# Patient Record
Sex: Male | Born: 1980 | Race: White | Hispanic: No | Marital: Married | Smoking: Never smoker
Health system: Southern US, Community
[De-identification: ages and names within clinical notes are randomized; demographics above are authoritative.]

## PROBLEM LIST (undated history)

## (undated) DIAGNOSIS — G4730 Sleep apnea, unspecified: Secondary | ICD-10-CM

## (undated) DIAGNOSIS — F319 Bipolar disorder, unspecified: Secondary | ICD-10-CM

## (undated) DIAGNOSIS — E669 Obesity, unspecified: Secondary | ICD-10-CM

## (undated) DIAGNOSIS — F919 Conduct disorder, unspecified: Secondary | ICD-10-CM

## (undated) DIAGNOSIS — M999 Biomechanical lesion, unspecified: Secondary | ICD-10-CM

## (undated) DIAGNOSIS — K219 Gastro-esophageal reflux disease without esophagitis: Secondary | ICD-10-CM

## (undated) DIAGNOSIS — S065X9A Traumatic subdural hemorrhage with loss of consciousness of unspecified duration, initial encounter: Secondary | ICD-10-CM

## (undated) DIAGNOSIS — I10 Essential (primary) hypertension: Secondary | ICD-10-CM

## (undated) DIAGNOSIS — G2589 Other specified extrapyramidal and movement disorders: Secondary | ICD-10-CM

## (undated) DIAGNOSIS — E1169 Type 2 diabetes mellitus with other specified complication: Principal | ICD-10-CM

## (undated) DIAGNOSIS — R0789 Other chest pain: Secondary | ICD-10-CM

## (undated) DIAGNOSIS — M25561 Pain in right knee: Secondary | ICD-10-CM

## (undated) DIAGNOSIS — S43004A Unspecified dislocation of right shoulder joint, initial encounter: Secondary | ICD-10-CM

## (undated) DIAGNOSIS — N200 Calculus of kidney: Secondary | ICD-10-CM

## (undated) DIAGNOSIS — M75101 Unspecified rotator cuff tear or rupture of right shoulder, not specified as traumatic: Secondary | ICD-10-CM

## (undated) DIAGNOSIS — S2241XA Multiple fractures of ribs, right side, initial encounter for closed fracture: Secondary | ICD-10-CM

## (undated) DIAGNOSIS — R350 Frequency of micturition: Secondary | ICD-10-CM

## (undated) DIAGNOSIS — D169 Benign neoplasm of bone and articular cartilage, unspecified: Secondary | ICD-10-CM

## (undated) DIAGNOSIS — S32401A Unspecified fracture of right acetabulum, initial encounter for closed fracture: Secondary | ICD-10-CM

## (undated) DIAGNOSIS — R000 Tachycardia, unspecified: Secondary | ICD-10-CM

## (undated) DIAGNOSIS — M549 Dorsalgia, unspecified: Secondary | ICD-10-CM

## (undated) DIAGNOSIS — E119 Type 2 diabetes mellitus without complications: Secondary | ICD-10-CM

## (undated) DIAGNOSIS — Z0000 Encounter for general adult medical examination without abnormal findings: Secondary | ICD-10-CM

## (undated) DIAGNOSIS — M25511 Pain in right shoulder: Secondary | ICD-10-CM

## (undated) DIAGNOSIS — E785 Hyperlipidemia, unspecified: Secondary | ICD-10-CM

## (undated) DIAGNOSIS — K439 Ventral hernia without obstruction or gangrene: Secondary | ICD-10-CM

## (undated) DIAGNOSIS — M7541 Impingement syndrome of right shoulder: Secondary | ICD-10-CM

## (undated) DIAGNOSIS — M216X9 Other acquired deformities of unspecified foot: Secondary | ICD-10-CM

## (undated) DIAGNOSIS — S4351XA Sprain of right acromioclavicular joint, initial encounter: Secondary | ICD-10-CM

## (undated) DIAGNOSIS — Z6841 Body Mass Index (BMI) 40.0 and over, adult: Secondary | ICD-10-CM

## (undated) HISTORY — DX: Essential (primary) hypertension: I10

## (undated) HISTORY — DX: Gastro-esophageal reflux disease without esophagitis: K21.9

## (undated) HISTORY — DX: Obesity, unspecified: E66.9

## (undated) HISTORY — DX: Other acquired deformities of unspecified foot: M21.6X9

## (undated) HISTORY — DX: Multiple fractures of ribs, right side, initial encounter for closed fracture: S22.41XA

## (undated) HISTORY — DX: Benign neoplasm of bone and articular cartilage, unspecified: D16.9

## (undated) HISTORY — DX: Calculus of kidney: N20.0

## (undated) HISTORY — DX: Bipolar disorder, unspecified: F31.9

## (undated) HISTORY — DX: Encounter for general adult medical examination without abnormal findings: Z00.00

## (undated) HISTORY — DX: Other chest pain: R07.89

## (undated) HISTORY — DX: Unspecified rotator cuff tear or rupture of right shoulder, not specified as traumatic: M75.101

## (undated) HISTORY — DX: Sleep apnea, unspecified: G47.30

## (undated) HISTORY — DX: Conduct disorder, unspecified: F91.9

## (undated) HISTORY — DX: Sprain of right acromioclavicular joint, initial encounter: S43.51XA

## (undated) HISTORY — DX: Pain in right shoulder: M25.511

## (undated) HISTORY — DX: Unspecified fracture of right acetabulum, initial encounter for closed fracture: S32.401A

## (undated) HISTORY — DX: Impingement syndrome of right shoulder: M75.41

## (undated) HISTORY — DX: Unspecified dislocation of right shoulder joint, initial encounter: S43.004A

## (undated) HISTORY — DX: Morbid (severe) obesity due to excess calories: E66.01

## (undated) HISTORY — DX: Tachycardia, unspecified: R00.0

## (undated) HISTORY — PX: CORONARY ARTERY BYPASS GRAFT: SHX141

## (undated) HISTORY — DX: Other specified extrapyramidal and movement disorders: G25.89

## (undated) HISTORY — DX: Biomechanical lesion, unspecified: M99.9

## (undated) HISTORY — DX: Ventral hernia without obstruction or gangrene: K43.9

## (undated) HISTORY — DX: Dorsalgia, unspecified: M54.9

## (undated) HISTORY — DX: Type 2 diabetes mellitus with other specified complication: E11.69

## (undated) HISTORY — DX: Type 2 diabetes mellitus without complications: E11.9

## (undated) HISTORY — DX: Hyperlipidemia, unspecified: E78.5

## (undated) HISTORY — DX: Pain in right knee: M25.561

## (undated) HISTORY — DX: Traumatic subdural hemorrhage with loss of consciousness of unspecified duration, initial encounter: S06.5X9A

## (undated) HISTORY — DX: Frequency of micturition: R35.0

## (undated) HISTORY — DX: Body Mass Index (BMI) 40.0 and over, adult: Z684

---

## 2000-10-13 ENCOUNTER — Emergency Department (HOSPITAL_COMMUNITY): Admission: EM | Admit: 2000-10-13 | Discharge: 2000-10-13 | Payer: Self-pay | Admitting: Emergency Medicine

## 2000-10-13 ENCOUNTER — Encounter: Payer: Self-pay | Admitting: Emergency Medicine

## 2001-08-16 ENCOUNTER — Emergency Department (HOSPITAL_COMMUNITY): Admission: EM | Admit: 2001-08-16 | Discharge: 2001-08-16 | Payer: Self-pay | Admitting: Emergency Medicine

## 2006-02-03 ENCOUNTER — Encounter: Admission: RE | Admit: 2006-02-03 | Discharge: 2006-02-03 | Payer: Self-pay | Admitting: Urology

## 2008-12-18 ENCOUNTER — Emergency Department (HOSPITAL_COMMUNITY): Admission: EM | Admit: 2008-12-18 | Discharge: 2008-12-18 | Payer: Self-pay | Admitting: Family Medicine

## 2010-01-18 ENCOUNTER — Emergency Department (HOSPITAL_COMMUNITY)
Admission: EM | Admit: 2010-01-18 | Discharge: 2010-01-18 | Payer: Self-pay | Source: Home / Self Care | Admitting: Emergency Medicine

## 2010-01-28 ENCOUNTER — Encounter
Admission: RE | Admit: 2010-01-28 | Discharge: 2010-02-17 | Payer: Self-pay | Source: Home / Self Care | Attending: Orthopedic Surgery | Admitting: Orthopedic Surgery

## 2010-04-23 LAB — POCT RAPID STREP A (OFFICE): Streptococcus, Group A Screen (Direct): NEGATIVE

## 2011-01-23 ENCOUNTER — Emergency Department (HOSPITAL_COMMUNITY): Payer: BC Managed Care – PPO

## 2011-01-23 ENCOUNTER — Encounter: Payer: Self-pay | Admitting: Adult Health

## 2011-01-23 ENCOUNTER — Emergency Department (HOSPITAL_COMMUNITY)
Admission: EM | Admit: 2011-01-23 | Discharge: 2011-01-23 | Disposition: A | Discharge status: Home / Self Care | Payer: BC Managed Care – PPO | Attending: Emergency Medicine | Admitting: Emergency Medicine

## 2011-01-23 DIAGNOSIS — R109 Unspecified abdominal pain: Secondary | ICD-10-CM | POA: Insufficient documentation

## 2011-01-23 DIAGNOSIS — N201 Calculus of ureter: Secondary | ICD-10-CM | POA: Insufficient documentation

## 2011-01-23 DIAGNOSIS — N200 Calculus of kidney: Secondary | ICD-10-CM

## 2011-01-23 DIAGNOSIS — K7689 Other specified diseases of liver: Secondary | ICD-10-CM | POA: Insufficient documentation

## 2011-01-23 LAB — BASIC METABOLIC PANEL
BUN: 10 mg/dL (ref 6–23)
CO2: 25 mEq/L (ref 19–32)
Calcium: 9.8 mg/dL (ref 8.4–10.5)
Chloride: 100 mEq/L (ref 96–112)
Creatinine, Ser: 0.77 mg/dL (ref 0.50–1.35)
Glucose, Bld: 172 mg/dL — ABNORMAL HIGH (ref 70–99)

## 2011-01-23 LAB — URINALYSIS, ROUTINE W REFLEX MICROSCOPIC
Glucose, UA: NEGATIVE mg/dL
Leukocytes, UA: NEGATIVE
Protein, ur: NEGATIVE mg/dL
Specific Gravity, Urine: 1.019 (ref 1.005–1.030)
Urobilinogen, UA: 0.2 mg/dL (ref 0.0–1.0)

## 2011-01-23 LAB — DIFFERENTIAL
Basophils Absolute: 0 10*3/uL (ref 0.0–0.1)
Eosinophils Absolute: 0.1 10*3/uL (ref 0.0–0.7)
Eosinophils Relative: 1 % (ref 0–5)
Lymphocytes Relative: 12 % (ref 12–46)
Monocytes Absolute: 0.5 10*3/uL (ref 0.1–1.0)

## 2011-01-23 LAB — CBC
HCT: 44.2 % (ref 39.0–52.0)
MCH: 29.7 pg (ref 26.0–34.0)
MCV: 85.8 fL (ref 78.0–100.0)
Platelets: 252 10*3/uL (ref 150–400)
RDW: 12.9 % (ref 11.5–15.5)
WBC: 11.4 10*3/uL — ABNORMAL HIGH (ref 4.0–10.5)

## 2011-01-23 LAB — URINE MICROSCOPIC-ADD ON

## 2011-01-23 MED ORDER — ONDANSETRON HCL 8 MG PO TABS: 8.0000 mg | ORAL_TABLET | Freq: Three times a day (TID) | ORAL | Status: AC | PRN

## 2011-01-23 MED ORDER — ONDANSETRON HCL 4 MG/2ML IJ SOLN
INTRAMUSCULAR | Status: AC
  Administered 2011-01-23: 21:00:00
  Filled 2011-01-23: qty 2

## 2011-01-23 MED ORDER — OXYCODONE-ACETAMINOPHEN 5-325 MG PO TABS: 2.0000 | ORAL_TABLET | ORAL | Status: AC | PRN

## 2011-01-23 MED ORDER — CEPHALEXIN 500 MG PO CAPS: 500.0000 mg | ORAL_CAPSULE | Freq: Four times a day (QID) | ORAL | Status: AC

## 2011-01-23 MED ORDER — IBUPROFEN 800 MG PO TABS: 800.0000 mg | ORAL_TABLET | Freq: Three times a day (TID) | ORAL | Status: AC | PRN

## 2011-01-23 MED ORDER — HYDROMORPHONE HCL PF 2 MG/ML IJ SOLN
2.0000 mg | Freq: Once | INTRAMUSCULAR | Status: AC
  Administered 2011-01-23: 2 mg via INTRAVENOUS
  Filled 2011-01-23: qty 1

## 2011-01-23 MED ORDER — KETOROLAC TROMETHAMINE 30 MG/ML IJ SOLN
30.0000 mg | Freq: Once | INTRAMUSCULAR | Status: AC
  Administered 2011-01-23: 30 mg via INTRAVENOUS
  Filled 2011-01-23: qty 1

## 2011-01-23 MED ORDER — TAMSULOSIN HCL 0.4 MG PO CAPS: 0.4000 mg | ORAL_CAPSULE | Freq: Every day | ORAL | Status: DC

## 2011-01-23 NOTE — ED Notes (Signed)
ZOX:[id number]<AV> Expected date:01/23/11<BR> Expected time: 5:55 PM<BR> Means of arrival:Ambulance<BR> Comments:<BR>

## 2011-01-23 NOTE — ED Notes (Signed)
Pt given 250 mcg of fentanyl by EMS with no relief.

## 2011-01-23 NOTE — ED Provider Notes (Signed)
History     CSN: [id number]  Arrival date & time 01/23/11  1751   First MD Initiated Contact with Patient 01/23/11 1856      Chief Complaint  Patient presents with  . Flank Pain  . Groin Pain    (Consider location/radiation/quality/duration/timing/severity/associated sxs/prior treatment) HPI Comments: The patient reports acute onset severe left flank pain around 2:30 PM earlier today, radiating around to the left lower abdomen and genitals, sharp and stabbing, and accompanied by nausea but no vomiting. At present he reports that he feels the need to urinate urgently but is unable to pass urine despite many attempts.  Patient is a 31 y.o. male presenting with flank pain. The history is provided by the patient.  Flank Pain This is a new problem. The current episode started 3 to 5 hours ago. The problem occurs constantly. The problem has been rapidly improving. Associated symptoms include abdominal pain. Pertinent negatives include no chest pain, no headaches and no shortness of breath. The symptoms are aggravated by nothing. The symptoms are relieved by medications, narcotics and NSAIDs.    History reviewed. No pertinent past medical history.  History reviewed. No pertinent past surgical history.  History reviewed. No pertinent family history.  History  Substance Use Topics  . Smoking status: Not on file  . Smokeless tobacco: Not on file  . Alcohol Use: Not on file      Review of Systems  Constitutional: Negative for fever, chills, activity change, appetite change and fatigue.  HENT: Negative.   Eyes: Negative.   Respiratory: Negative for cough, shortness of breath and wheezing.   Cardiovascular: Negative for chest pain.  Gastrointestinal: Positive for nausea, vomiting and abdominal pain. Negative for diarrhea, constipation, blood in stool, abdominal distention, anal bleeding and rectal pain.  Genitourinary: Positive for dysuria, urgency and flank pain. Negative for  frequency, hematuria, decreased urine volume, discharge, genital sores, penile pain and testicular pain.  Musculoskeletal: Negative.   Skin: Negative for color change and rash.  Neurological: Negative for light-headedness and headaches.  Hematological: Does not bruise/bleed easily.  Psychiatric/Behavioral: Negative.     Allergies  Review of patient's allergies indicates no known allergies.  Home Medications   Current Outpatient Rx  Name Route Sig Dispense Refill  . DOCUSATE SODIUM 100 MG PO CAPS Oral Take 100 mg by mouth 2 (two) times daily.        BP 118/64  Pulse 118  Temp(Src) 98.3 F (36.8 C) (Oral)  Resp 24  SpO2 92%  Physical Exam  Nursing note and vitals reviewed. Constitutional: He is oriented to person, place, and time. He appears well-nourished. No distress.  HENT:  Head: Normocephalic and atraumatic.  Eyes: EOM are normal. Pupils are equal, round, and reactive to light.  Neck: Normal range of motion. Neck supple. No JVD present.  Cardiovascular: Normal rate, regular rhythm, normal heart sounds and intact distal pulses.  Exam reveals no gallop and no friction rub.   No murmur heard. Pulmonary/Chest: Effort normal and breath sounds normal. No respiratory distress. He has no wheezes. He has no rales. He exhibits no tenderness.  Abdominal: Soft. Bowel sounds are normal. He exhibits no distension, no fluid wave, no ascites and no mass. There is tenderness. There is CVA tenderness. There is no rebound and no guarding.  Musculoskeletal: Normal range of motion. He exhibits no edema and no tenderness.  Neurological: He is alert and oriented to person, place, and time. He has normal reflexes. No cranial nerve deficit. He exhibits  normal muscle tone. Coordination normal.  Skin: Skin is warm and dry. No rash noted. He is not diaphoretic. No erythema. No pallor.  Psychiatric: He has a normal mood and affect. His behavior is normal. Judgment and thought content normal.    ED  Course  Procedures (including critical care time)  Labs Reviewed  CBC - Abnormal; Notable for the following:    WBC 11.4 (*)    All other components within normal limits  DIFFERENTIAL - Abnormal; Notable for the following:    Neutrophils Relative 82 (*)    Neutro Abs 9.4 (*)    All other components within normal limits  BASIC METABOLIC PANEL  URINE CULTURE  URINALYSIS, ROUTINE W REFLEX MICROSCOPIC   No results found.   No diagnosis found.    MDM  Symptoms are strongly suggestive of kidney stone, although urinary tract infection and renal colic are also considerations in the differential diagnosis amongst other etiologies. The patient's pain has been controlled by IV analgesics, and I will have a nurse do an in and out cath of the bladder to relieve any potential bladder outlet obstruction and relieve the patient's urinary urgency. I will obtain a CT scan to evaluate for kidney stone.        Felisa Bonier, MD 01/23/11 2001

## 2011-01-23 NOTE — ED Notes (Signed)
Family at bedside. 

## 2011-01-23 NOTE — ED Notes (Signed)
Patient transported to CT 

## 2011-01-23 NOTE — ED Notes (Signed)
C/o 10/10 sudden onset of left flank pain that radiates to abdomen and groin. Pain has increased in intensity. Unable to urinate.

## 2011-01-25 LAB — URINE CULTURE
Colony Count: NO GROWTH
Culture: NO GROWTH

## 2011-01-30 ENCOUNTER — Other Ambulatory Visit: Payer: Self-pay | Admitting: Orthopedic Surgery

## 2011-01-30 DIAGNOSIS — M25561 Pain in right knee: Secondary | ICD-10-CM

## 2011-02-05 ENCOUNTER — Ambulatory Visit
Admission: RE | Admit: 2011-02-05 | Discharge: 2011-02-05 | Disposition: A | Discharge status: Home / Self Care | Payer: Worker's Compensation | Source: Ambulatory Visit | Attending: Orthopedic Surgery | Admitting: Orthopedic Surgery

## 2011-02-05 DIAGNOSIS — M25561 Pain in right knee: Secondary | ICD-10-CM

## 2011-07-23 ENCOUNTER — Encounter (HOSPITAL_COMMUNITY): Payer: Self-pay | Admitting: Emergency Medicine

## 2011-07-23 ENCOUNTER — Emergency Department (HOSPITAL_COMMUNITY)
Admission: EM | Admit: 2011-07-23 | Discharge: 2011-07-23 | Disposition: A | Discharge status: Home / Self Care | Payer: BC Managed Care – PPO | Attending: Emergency Medicine | Admitting: Emergency Medicine

## 2011-07-23 DIAGNOSIS — R109 Unspecified abdominal pain: Secondary | ICD-10-CM | POA: Insufficient documentation

## 2011-07-23 DIAGNOSIS — N209 Urinary calculus, unspecified: Secondary | ICD-10-CM

## 2011-07-23 DIAGNOSIS — M549 Dorsalgia, unspecified: Secondary | ICD-10-CM | POA: Insufficient documentation

## 2011-07-23 DIAGNOSIS — N200 Calculus of kidney: Secondary | ICD-10-CM | POA: Insufficient documentation

## 2011-07-23 DIAGNOSIS — E119 Type 2 diabetes mellitus without complications: Secondary | ICD-10-CM | POA: Insufficient documentation

## 2011-07-23 HISTORY — DX: Bipolar disorder, unspecified: F31.9

## 2011-07-23 HISTORY — DX: Calculus of kidney: N20.0

## 2011-07-23 LAB — CBC WITH DIFFERENTIAL/PLATELET
Basophils Relative: 0 % (ref 0–1)
Eosinophils Absolute: 0.2 10*3/uL (ref 0.0–0.7)
Eosinophils Relative: 2 % (ref 0–5)
HCT: 46 % (ref 39.0–52.0)
Hemoglobin: 16 g/dL (ref 13.0–17.0)
MCH: 30.3 pg (ref 26.0–34.0)
MCHC: 34.8 g/dL (ref 30.0–36.0)
Monocytes Absolute: 0.5 10*3/uL (ref 0.1–1.0)
Monocytes Relative: 6 % (ref 3–12)
RDW: 12.9 % (ref 11.5–15.5)

## 2011-07-23 LAB — URINALYSIS, ROUTINE W REFLEX MICROSCOPIC
Glucose, UA: NEGATIVE mg/dL
Leukocytes, UA: NEGATIVE
Specific Gravity, Urine: 1.029 (ref 1.005–1.030)
Urobilinogen, UA: 0.2 mg/dL (ref 0.0–1.0)

## 2011-07-23 LAB — BASIC METABOLIC PANEL
BUN: 16 mg/dL (ref 6–23)
Calcium: 9.5 mg/dL (ref 8.4–10.5)
Chloride: 102 mEq/L (ref 96–112)
Creatinine, Ser: 0.84 mg/dL (ref 0.50–1.35)
GFR calc Af Amer: >90 mL/min (ref >90)
GFR calc non Af Amer: >90 mL/min (ref >90)

## 2011-07-23 LAB — URINE MICROSCOPIC-ADD ON

## 2011-07-23 MED ORDER — ONDANSETRON HCL 4 MG/2ML IJ SOLN
4.0000 mg | Freq: Once | INTRAMUSCULAR | Status: AC
  Administered 2011-07-23: 4 mg via INTRAVENOUS
  Filled 2011-07-23: qty 2

## 2011-07-23 MED ORDER — HYDROMORPHONE HCL PF 1 MG/ML IJ SOLN
1.0000 mg | Freq: Once | INTRAMUSCULAR | Status: AC
  Administered 2011-07-23: 1 mg via INTRAVENOUS
  Filled 2011-07-23: qty 1

## 2011-07-23 MED ORDER — OXYCODONE-ACETAMINOPHEN 5-325 MG PO TABS: 1.0000 | ORAL_TABLET | ORAL | Status: AC | PRN

## 2011-07-23 MED ORDER — FENTANYL CITRATE 0.05 MG/ML IJ SOLN
50.0000 ug | Freq: Once | INTRAMUSCULAR | Status: AC
  Administered 2011-07-23: 50 ug via INTRAVENOUS
  Filled 2011-07-23: qty 2

## 2011-07-23 NOTE — ED Notes (Signed)
Pt's 02 drops to low to mid 80s when he falls asleep, when he wakes up pt's 02 goes back to 100%

## 2011-07-23 NOTE — ED Provider Notes (Signed)
History     CSN: [id number]  Arrival date & time 07/23/11  0420   First MD Initiated Contact with Patient 07/23/11 0501      Chief Complaint  Patient presents with  . Abdominal Pain  . Back Pain    (Consider location/radiation/quality/duration/timing/severity/associated sxs/prior treatment) HPI Comments: Colin Bennett is a 31 y.o. Male who presents with right flank pain, which started at 1:30 AM today. It is similar to prior pains he has had with kidney stone. He has passed several kidney stones this year. He denies fever, or chills. He has had nausea and vomiting this morning. He did not take any medication yet. He does not know any aggravating or alleviating factors. He was recently diagnosed with diabetes, but is treated only with diet control currently.  Patient is a 31 y.o. male presenting with abdominal pain and back pain. The history is provided by the patient.  Abdominal Pain The primary symptoms of the illness include abdominal pain.  Additional symptoms associated with the illness include back pain.  Back Pain  Associated symptoms include abdominal pain.    Past Medical History  Diagnosis Date  . Kidney stones   . Diabetes mellitus   . Bipolar 1 disorder     History reviewed. No pertinent past surgical history.  Family History  Problem Relation Age of Onset  . Hypertension Other   . Diabetes Other     History  Substance Use Topics  . Smoking status: Never Smoker   . Smokeless tobacco: Not on file  . Alcohol Use: No      Review of Systems  Gastrointestinal: Positive for abdominal pain.  Musculoskeletal: Positive for back pain.  All other systems reviewed and are negative.    Allergies  Review of patient's allergies indicates no known allergies.  Home Medications   No current outpatient prescriptions on file.  BP 148/109  Pulse 76  Temp 97.8 F (36.6 C) (Oral)  Resp 21  SpO2 89%  Physical Exam  Nursing note and vitals  reviewed. Constitutional: He is oriented to person, place, and time. He appears well-developed and well-nourished.  HENT:  Head: Normocephalic and atraumatic.  Right Ear: External ear normal.  Left Ear: External ear normal.  Eyes: Conjunctivae and EOM are normal. Pupils are equal, round, and reactive to light.  Neck: Normal range of motion and phonation normal. Neck supple.  Cardiovascular: Normal rate, regular rhythm, normal heart sounds and intact distal pulses.   Pulmonary/Chest: Effort normal and breath sounds normal. He exhibits no bony tenderness.  Abdominal: Soft. Normal appearance. There is tenderness.  Genitourinary:       No costovertebral angle tenderness  Musculoskeletal: Normal range of motion.  Neurological: He is alert and oriented to person, place, and time. He has normal strength. No cranial nerve deficit or sensory deficit. He exhibits normal muscle tone. Coordination normal.  Skin: Skin is warm, dry and intact.  Psychiatric: He has a normal mood and affect. His behavior is normal. Judgment and thought content normal.    ED Course  Procedures (including critical care time)  Emergency department treatment: IV, Dilaudid, and Zofran.  Reevaluation: 06:50- pain is resolved   Labs Reviewed  BASIC METABOLIC PANEL - Abnormal; Notable for the following:    Glucose, Bld 198 (*)     All other components within normal limits  URINALYSIS, ROUTINE W REFLEX MICROSCOPIC - Abnormal; Notable for the following:    Color, Urine AMBER (*)  BIOCHEMICALS MAY BE AFFECTED BY COLOR  APPearance CLOUDY (*)     Hgb urine dipstick LARGE (*)     Ketones, ur TRACE (*)     Protein, ur 30 (*)     All other components within normal limits  URINE MICROSCOPIC-ADD ON - Abnormal; Notable for the following:    Bacteria, UA MANY (*)     All other components within normal limits  CBC WITH DIFFERENTIAL   No results found.   1. Urolithiasis       MDM  Patient with known bilateral renal  stones. Evaluated, and treated without CT imaging. He is improved. I suspect ureteral colic, right. He is a reasonable candidate for discharge with symptomatic treatment. Doubt metabolic instability, serious bacterial infection or impending vascular collapse; the patient is stable for discharge.   Plan: Home Medications- percocet, instructed to use Flomax, that he has at home; Home Treatments- strain urine; Recommended follow up- Urology f/u in 1 weeek       Flint Melter, MD 07/23/11 [phone]

## 2011-07-23 NOTE — ED Notes (Signed)
Pt states he is having lower abd pain like in his pelvic area and low back pain bilaterally   Pt states he has hx of kidney stones   Pt newly diagnosed with diabetes

## 2011-07-23 NOTE — ED Notes (Signed)
Pt notified that we need urine sample.  St's he doesn't need to go right now.  Pt given a urinal.  Will check back shortly.

## 2011-08-03 ENCOUNTER — Ambulatory Visit (INDEPENDENT_AMBULATORY_CARE_PROVIDER_SITE_OTHER): Payer: BC Managed Care – PPO | Admitting: Internal Medicine

## 2011-08-03 ENCOUNTER — Encounter: Payer: Self-pay | Admitting: Internal Medicine

## 2011-08-03 VITALS — BP 116/82 | HR 118 | Temp 98.3°F | Resp 18 | Ht 71.5 in | Wt 383.0 lb

## 2011-08-03 DIAGNOSIS — E119 Type 2 diabetes mellitus without complications: Secondary | ICD-10-CM | POA: Insufficient documentation

## 2011-08-03 HISTORY — DX: Type 2 diabetes mellitus without complications: E11.9

## 2011-08-03 LAB — HEMOGLOBIN A1C: Hgb A1c MFr Bld: 8.2 % — ABNORMAL HIGH (ref <5.7)

## 2011-08-03 NOTE — Progress Notes (Signed)
  Subjective:    Patient ID: Colin Bennett, male    DOB: 1980-08-04, 31 y.o.   MRN: [id number]  HPI Pt presents to clinic for evaluation of diabetes mellitus. Notes recent new dx within last few weeks. Presented for physical and had hyperglycemia of 171 on labs. Repeated glucose fasting the next day 182. +polyuria. Was begun on unknown dose of sulfonylurea. Monitoring fsbs tid before meals and notes range of 120-170 without hypoglycemia. Known h/o kidney stones with recent ED visit for same. chem7 reviewed from that visit shows glucose of 198.   Past Medical History  Diagnosis Date  . Kidney stones   . Diabetes mellitus   . Bipolar 1 disorder    No past surgical history on file.  reports that he has never smoked. He does not have any smokeless tobacco history on file. He reports that he does not drink alcohol or use illicit drugs. family history includes Diabetes in his other and Hypertension in his other. No Known Allergies   Review of Systems  Genitourinary: Negative for urgency and difficulty urinating.       +polyuria  Musculoskeletal: Negative for back pain.  Neurological: Negative for numbness.  All other systems reviewed and are negative.       Objective:   Physical Exam  Nursing note and vitals reviewed. Constitutional: He appears well-developed and well-nourished. No distress.  HENT:  Head: Normocephalic and atraumatic.  Right Ear: External ear normal.  Left Ear: External ear normal.  Nose: Nose normal.  Eyes: Conjunctivae are normal. No scleral icterus.  Neck: Neck supple.  Cardiovascular: Normal rate, regular rhythm and normal heart sounds.  Exam reveals no gallop and no friction rub.   No murmur heard. Pulmonary/Chest: Effort normal and breath sounds normal. No respiratory distress. He has no wheezes. He has no rales.  Neurological: He is alert.  Skin: Skin is warm and dry. He is not diaphoretic.  Psychiatric: He has a normal mood and affect.            Assessment & Plan:

## 2011-08-03 NOTE — Assessment & Plan Note (Signed)
Obtain A1c. Refer to nutritionist/diabetic educator. Encouraged low sugar/carb diet, regular exercise and wt loss. Call with medication dosage. Aware of potential for hypoglycemia and know s/s. Carry piece of candy or glucose tab on person. Schedule close follow up.

## 2011-08-04 ENCOUNTER — Telehealth: Payer: Self-pay | Admitting: Internal Medicine

## 2011-08-04 NOTE — Telephone Encounter (Signed)
Patient states that he is currently taking   Glimepiride 2mg  once daily.

## 2011-08-04 NOTE — Telephone Encounter (Signed)
Patient's EMR chart updated with Rx information/SLS

## 2011-08-06 ENCOUNTER — Telehealth: Payer: Self-pay | Admitting: *Deleted

## 2011-08-06 MED ORDER — GLIMEPIRIDE 4 MG PO TABS: 4.0000 mg | ORAL_TABLET | Freq: Every day | ORAL | Status: DC

## 2011-08-06 NOTE — Telephone Encounter (Signed)
Patient informed, will keep log of CBG readings; new Rx to pharmacy per patient request/SLS

## 2011-08-06 NOTE — Telephone Encounter (Signed)
Message copied by Regis Bill on Thu Aug 06, 2011 12:07 PM ------      Message from: Edwyna Perfect      Created: Wed Aug 05, 2011  3:09 PM       a1c 8.2 which roughly equals a sugar avg of ~190. Would increase amaryl form 2mg  qd to 4mg  qd aiming for am fasting sugars to be <130 but without lows (60's or lower)

## 2011-08-24 ENCOUNTER — Ambulatory Visit: Payer: BC Managed Care – PPO | Admitting: Internal Medicine

## 2011-08-24 ENCOUNTER — Ambulatory Visit: Payer: BC Managed Care – PPO | Admitting: *Deleted

## 2011-08-31 ENCOUNTER — Encounter: Payer: BC Managed Care – PPO | Attending: Internal Medicine | Admitting: *Deleted

## 2011-08-31 ENCOUNTER — Encounter: Payer: Self-pay | Admitting: Internal Medicine

## 2011-08-31 ENCOUNTER — Ambulatory Visit (INDEPENDENT_AMBULATORY_CARE_PROVIDER_SITE_OTHER): Payer: BC Managed Care – PPO | Admitting: Internal Medicine

## 2011-08-31 ENCOUNTER — Telehealth: Payer: Self-pay | Admitting: Internal Medicine

## 2011-08-31 ENCOUNTER — Encounter: Payer: Self-pay | Admitting: *Deleted

## 2011-08-31 VITALS — Ht 74.5 in | Wt 380.0 lb

## 2011-08-31 VITALS — BP 126/84 | HR 115 | Temp 98.5°F | Resp 18 | Wt 383.0 lb

## 2011-08-31 DIAGNOSIS — B353 Tinea pedis: Secondary | ICD-10-CM

## 2011-08-31 DIAGNOSIS — E119 Type 2 diabetes mellitus without complications: Secondary | ICD-10-CM

## 2011-08-31 DIAGNOSIS — Z713 Dietary counseling and surveillance: Secondary | ICD-10-CM | POA: Insufficient documentation

## 2011-08-31 MED ORDER — FLUCONAZOLE 100 MG PO TABS: 100.0000 mg | ORAL_TABLET | Freq: Every day | ORAL | Status: AC

## 2011-08-31 NOTE — Patient Instructions (Signed)
Please use over the counter lotrimin ultra twice a day for your feet with the pill medication.  Please schedule fasting labs prior to your next visit chem7, a1c, urine microalbumin-250.0 and lipid/lft-272.4

## 2011-08-31 NOTE — Assessment & Plan Note (Signed)
Improving control. Continue amaryl at current dosing but monitor for hypoglycemia (aware of s/s). Prescription provided for test strips and lancets.

## 2011-08-31 NOTE — Progress Notes (Signed)
  Subjective:    Patient ID: Colin Bennett, male    DOB: 1980-08-16, 31 y.o.   MRN: [id number]  HPI Pt presents to clinic for followup of multiple medical problems. Reviewed recent dx of DM s/p dose increase of amaryl due to A1c of 8.2. Saw diabetic education/nutritionist today and found it very helpful. He and his wife plan on losing 100lbs together with diet and exercise. Reviewed fsbs log- predominance of values in low to mid 100's. Occasional sub 100 value without hypoglycemia. Has chronic bilateral foot rash s/p antifungal creams with improvement but no resolution.   Past Medical History  Diagnosis Date  . Kidney stones   . Diabetes mellitus   . Bipolar 1 disorder    No past surgical history on file.  reports that he has never smoked. He has never used smokeless tobacco. He reports that he does not drink alcohol or use illicit drugs. family history includes Diabetes in his other and Hypertension in his other. No Known Allergies    Review of Systems see hpi     Objective:   Physical Exam  Nursing note and vitals reviewed. Constitutional: He appears well-developed and well-nourished. No distress.  HENT:  Head: Normocephalic and atraumatic.  Eyes: Conjunctivae are normal. No scleral icterus.  Neurological: He is alert.  Skin: Skin is warm and dry. Rash noted. He is not diaphoretic. No erythema.       Diabetic foot exam: +2 dp pulses bilaterally. Monofilament normal bilaterally. Diffuse plantar and intertriginous scaling/dryness with occasional fissuring. No drainage, redness or warmth.   Psychiatric: He has a normal mood and affect.          Assessment & Plan:

## 2011-08-31 NOTE — Progress Notes (Signed)
  Medical Nutrition Therapy:  Appt start time: 1030 end time:  1200.  Assessment:  Primary concerns today: patient here with his wife who appears very supportive. He states he was diagnosed 6 weeks ago and is currently on Amaryl @ 40 mg before breakfast. 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 His wife works 8-5 PM 5 days a week and prepares the evening meals now. He states he is not active due to his irregular work hours. They are buying a home and hope to increase their activity level after they move.  MEDICATIONS: Amaryl 40 mg/ day   DIETARY INTAKE:  Usual eating pattern includes 3 meals and 1-2 snacks per day.  Everyday foods include good variety of all food groups. He has cut back significantly on carbs since the diagnosis.  Avoided foods include high carb and fried foods now.    24-hr recall:  B ( AM): at home: sweetened cereal with 2% milk, grilled chicken club on wheat with lett tomato, and mayo and grits, water Snk ( AM): occasionally [employer] Protein Bar  L ( PM): sit down restaurant salad bar OR grilled chicken sandwich with slaw, 1/2 and 1/2 tea Snk ( PM): occasional Protein Bar D ( PM): cooking at home more: lean meat, less starch, much more vegetables, diet soda or water Snk ( PM): none Beverages: water, diet soda, 1/2 and 1/2 tea  Usual physical activity: none, sleeping or working  Estimated energy needs: 2400 calories 270 g carbohydrates 180 g protein 67 g fat  Progress Towards Goal(s):  In progress.   Nutritional Diagnosis:  NB-1.1 Food and nutrition-related knowledge deficit As related to new diagnosis of diabetes.  As evidenced by A1c of 8.2%.    Intervention:  Nutrition counseling and diabetes education initiated. Discussed basic physiology of diabetes, SMBG and rationale of checking BG at alternate times of day, A1c, Carb Counting and reading food labels, and benefits of increased activity. Also discussed action of Amaryl and provided  information on other potential medication options   Handouts given during visit include: Living Well with Diabetes Carb Counting and Food Label handouts Meal Plan Card  Diabetes Medication handout  Monitoring/Evaluation:  Dietary intake, exercise, reading food labels, SMBG and body weight prn. Patient to call for follow up appointment when he can schedule with his work.

## 2011-08-31 NOTE — Assessment & Plan Note (Signed)
Attempt otc lotrimin ultra bid to affected area and diflucan x 14 days. Followup if no improvement or worsening.

## 2011-09-01 ENCOUNTER — Telehealth: Payer: Self-pay | Admitting: Internal Medicine

## 2011-09-01 NOTE — Telephone Encounter (Signed)
Patient states that [facility] will not fill lantus strip prescription. Pharmacy told pt that instructions were written wrong and to contact us. Please assist.   [facility] on [address]

## 2011-09-02 NOTE — Telephone Encounter (Signed)
Order was corrected and faxed back to [facility] Attn: Christina with override for BID testing [BM with Hyperglycemia] 08.13.13/SLS

## 2011-09-11 NOTE — Telephone Encounter (Signed)
Labs entered... 09/11/11@12 :12pm/LMB

## 2011-10-30 LAB — HEMOGLOBIN A1C: Hgb A1c MFr Bld: 6.9 % — ABNORMAL HIGH (ref <5.7)

## 2011-10-30 NOTE — Addendum Note (Signed)
Addended by: Mervin Kung A on: 10/30/2011 02:15 PM   Modules accepted: Orders

## 2011-10-30 NOTE — Telephone Encounter (Signed)
Lipids/lft's, bmp were not drawn on pt today because they were not ordered correctly. Only labs obtained for future appt were:  hgb a1c and urine microalbumin.  Please schedule fasting labs prior to your next visit chem7, a1c, urine microalbumin-250.0 and lipid/lft-272.4

## 2011-10-30 NOTE — Telephone Encounter (Signed)
Pt presented to the lab, order released. 

## 2011-10-30 NOTE — Addendum Note (Signed)
Addended by: Mervin Kung A on: 10/30/2011 01:34 PM   Modules accepted: Orders

## 2011-10-31 LAB — MICROALBUMIN / CREATININE URINE RATIO
Creatinine, Urine: 188 mg/dL
Microalb Creat Ratio: 7.8 mg/g (ref 0.0–30.0)

## 2011-11-02 ENCOUNTER — Ambulatory Visit (INDEPENDENT_AMBULATORY_CARE_PROVIDER_SITE_OTHER): Payer: BC Managed Care – PPO | Admitting: Internal Medicine

## 2011-11-02 ENCOUNTER — Encounter: Payer: Self-pay | Admitting: Internal Medicine

## 2011-11-02 VITALS — BP 114/88 | HR 80 | Temp 98.5°F | Resp 16 | Wt 377.0 lb

## 2011-11-02 DIAGNOSIS — R0989 Other specified symptoms and signs involving the circulatory and respiratory systems: Secondary | ICD-10-CM

## 2011-11-02 DIAGNOSIS — E119 Type 2 diabetes mellitus without complications: Secondary | ICD-10-CM

## 2011-11-02 DIAGNOSIS — R0683 Snoring: Secondary | ICD-10-CM

## 2011-11-02 DIAGNOSIS — R5381 Other malaise: Secondary | ICD-10-CM

## 2011-11-02 DIAGNOSIS — R5383 Other fatigue: Secondary | ICD-10-CM | POA: Insufficient documentation

## 2011-11-02 NOTE — Patient Instructions (Signed)
Please schedule fasting labs prior to next visit A1c, chem7, lipid-250.00

## 2011-11-02 NOTE — Assessment & Plan Note (Signed)
Improving control.  Continue current regimen. 

## 2011-11-02 NOTE — Progress Notes (Signed)
  Subjective:    Patient ID: Colin Bennett, male    DOB: [birth year], 31 y.o.   MRN: [id number]  HPI Pt presents to clinic for followup of multiple medical problems. Recent diagnosis of diabetes. Reports fasting blood sugars a hundred sixteen and a hundred nineteen recently. No hypoglycemia. Postprandial evening blood sugars of a hundred forty-a hundred sixty. Reviewed A1c improved to six point nine. Notes one-month history of increased fatigue. Does snore and has witnessed apnea per his wife. Tinea pedis now resolved with combination of Lotrimin Ultra and Diflucan. No other complaints. He  Past Medical History  Diagnosis Date  . Kidney stones   . Diabetes mellitus   . Bipolar 1 disorder    No past surgical history on file.  reports that he has never smoked. He has never used smokeless tobacco. He reports that he does not drink alcohol or use illicit drugs. family history includes Diabetes in his other and Hypertension in his other. No Known Allergies    Review of Systems see hpi     Objective:   Physical Exam  Nursing note and vitals reviewed. Constitutional: He appears well-developed and well-nourished. No distress.  HENT:  Head: Normocephalic and atraumatic.  Neurological: He is alert.  Skin: He is not diaphoretic.  Psychiatric: He has a normal mood and affect.          Assessment & Plan:

## 2011-11-02 NOTE — Assessment & Plan Note (Signed)
With associated fatigue and witnessed apnea. Schedule pulmonary consult for consideration of possible sleep apnea

## 2011-11-02 NOTE — Assessment & Plan Note (Signed)
Obtain CBC, Chem-7, liver function tests and TSH 

## 2011-11-03 ENCOUNTER — Telehealth: Payer: Self-pay | Admitting: Internal Medicine

## 2011-11-03 LAB — CBC WITH DIFFERENTIAL/PLATELET
Basophils Absolute: 0 10*3/uL (ref 0.0–0.1)
Basophils Relative: 0 % (ref 0–1)
Hemoglobin: 16.6 g/dL (ref 13.0–17.0)
MCHC: 34.4 g/dL (ref 30.0–36.0)
Monocytes Relative: 6 % (ref 3–12)
Neutro Abs: 6.9 10*3/uL (ref 1.7–7.7)
Neutrophils Relative %: 64 % (ref 43–77)
Platelets: 274 10*3/uL (ref 150–400)

## 2011-11-03 LAB — HEPATIC FUNCTION PANEL
AST: 18 U/L (ref 0–37)
Alkaline Phosphatase: 72 U/L (ref 39–117)
Bilirubin, Direct: 0.1 mg/dL (ref 0.0–0.3)
Indirect Bilirubin: 0.5 mg/dL (ref 0.0–0.9)
Total Bilirubin: 0.6 mg/dL (ref 0.3–1.2)

## 2011-11-03 LAB — BASIC METABOLIC PANEL
Potassium: 4.4 mEq/L (ref 3.5–5.3)
Sodium: 141 mEq/L (ref 135–145)

## 2011-11-03 NOTE — Telephone Encounter (Signed)
Lab order week of 01-25-2012 A1c, chem7, lipid-250.00

## 2011-11-10 ENCOUNTER — Institutional Professional Consult (permissible substitution): Payer: BC Managed Care – PPO | Admitting: Pulmonary Disease

## 2011-11-24 ENCOUNTER — Ambulatory Visit (INDEPENDENT_AMBULATORY_CARE_PROVIDER_SITE_OTHER): Payer: BC Managed Care – PPO | Admitting: Pulmonary Disease

## 2011-11-24 ENCOUNTER — Encounter: Payer: Self-pay | Admitting: Pulmonary Disease

## 2011-11-24 VITALS — BP 140/86 | HR 96 | Temp 98.1°F | Ht 75.0 in | Wt 349.0 lb

## 2011-11-24 DIAGNOSIS — G4730 Sleep apnea, unspecified: Secondary | ICD-10-CM

## 2011-11-24 DIAGNOSIS — G4733 Obstructive sleep apnea (adult) (pediatric): Secondary | ICD-10-CM

## 2011-11-24 HISTORY — DX: Sleep apnea, unspecified: G47.30

## 2011-11-24 NOTE — Patient Instructions (Signed)
Schedule sleep study

## 2011-11-24 NOTE — Assessment & Plan Note (Addendum)
Given excessive daytime somnolence, narrow pharyngeal exam, witnessed apneas & loud snoring, obstructive sleep apnea is very likely & an overnight polysomnogram will be scheduled as a split study. The pathophysiology of obstructive sleep apnea , it's cardiovascular consequences & modes of treatment including CPAP were discused with the patient in detail & they evidenced understanding.  His lack of a fixed sleep and poor sleep hygiene is also contributing to his problems. We talked a little bit about this and we'll focus more on this once his OSA is adequately treated. 

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
## 2011-12-23 ENCOUNTER — Ambulatory Visit (HOSPITAL_BASED_OUTPATIENT_CLINIC_OR_DEPARTMENT_OTHER): Payer: BC Managed Care – PPO | Attending: Pulmonary Disease | Admitting: Radiology

## 2011-12-23 VITALS — Ht 75.0 in | Wt 370.0 lb

## 2011-12-23 DIAGNOSIS — G4733 Obstructive sleep apnea (adult) (pediatric): Secondary | ICD-10-CM | POA: Insufficient documentation

## 2011-12-23 DIAGNOSIS — Z6841 Body Mass Index (BMI) 40.0 and over, adult: Secondary | ICD-10-CM | POA: Insufficient documentation

## 2011-12-31 ENCOUNTER — Telehealth: Payer: Self-pay | Admitting: Pulmonary Disease

## 2011-12-31 DIAGNOSIS — G4710 Hypersomnia, unspecified: Secondary | ICD-10-CM

## 2011-12-31 DIAGNOSIS — G4733 Obstructive sleep apnea (adult) (pediatric): Secondary | ICD-10-CM

## 2011-12-31 DIAGNOSIS — G4730 Sleep apnea, unspecified: Secondary | ICD-10-CM

## 2011-12-31 NOTE — Telephone Encounter (Signed)
PSG showed OSA - order sent for CPAP 16 cm, med FF mask

## 2012-01-01 NOTE — Procedures (Signed)
Colin Bennett, Colin NO.:  [id number]  MEDICAL RECORD NO.:  [id number]          PATIENT TYPE:  OUT  LOCATION:  SLEEP CENTER                 FACILITY:  [facility]  PHYSICIAN:  Oretha Milch, MD      DATE OF BIRTH:  Sep 30, 1980  DATE OF STUDY:  12/23/2011                           NOCTURNAL POLYSOMNOGRAM  REFERRING PHYSICIAN:  Oretha Milch, MD  INDICATION FOR STUDY:  Colin Bennett is a morbidly obese gentleman with excessive daytime fatigue, loud and continuous snoring, witnessed apneas, and restless sleep.  At the time of this study, he weighed 370 pounds with a height of 6 and feet 3 inches, BMI of 46, neck size of 19.5 inches.  EPWORTH SLEEPINESS SCORE:  14.  This intervention polysomnogram was performed with sleep technologist in attendance.  EEG, EOG, EMG, EKG, and respiratory parameters were recorded.  Sleep stages, arousals, limb movements, and respiratory data were scored according to criteria laid out by the American Academy of Sleep Medicine.  MEDICATIONS:  SLEEP ARCHITECTURE:  Lights out was at 9:57 p.m., lights on was at 5:04 a.m.  CPAP was initiated at 12:50 a.m.  During the diagnostic portion, total sleep time was 137 minutes with a sleep period time of 142 minutes and the sleep latency of 29 minutes.  Latency to REM sleep was 60 minutes and wake after sleep onset was 5 minutes.  Sleep stages as a percentage of total sleep time was N1 3%, N2 50%, N3 31% and REM sleep 16% (21 minutes).  During the titration portion, REM sleep accounted for 52 minutes and supine sleep accounted for 91 minutes.  Longest period of REM sleep was around 1 a.m.  RESPIRATORY DATA:  During the diagnostic portion, there were 7 obstructive apneas, 7 central apneas, 0 mixed apnea and 26 hypopneas with an apnea-hypopnea index of 17.5 events per hour, and the lowest desaturation of 77%.  Due to this degree of respiratory disturbance, CPAP was initiated at 5 cm with a medium  fullface mask and titrated to a final level of 16 cm due to hypopneas and snoring.  At this final level for 94 minutes including 13 minutes of REM sleep, 1 hypopnea was noted with the lowest desaturation of 90%.  This appears to be the optimal level used during the study.  AROUSAL DATA:  During the diagnostic portion, the arousal index was 12 events per hour and during titration portion, this was 5 events per hour.  OXYGEN DATA:  The desaturation index was 18 events per hour in the diagnostic portion and 3 events per hour during the titration portion with a lowest desaturation of 77%.  He only spent 0.2 minutes with a sat less than 90% during the titration portion.  CARDIAC DATA:  The low heart rate was 37 beats per minute, the high heart rate recorded was an artifact.  No arrhythmias were noted.  MOVEMENT-PARASOMNIA:  No significant limb movements were noted.  DISCUSSION:  He was desensitized with the medium fullface mask.  CPAP was titrated to absence of hypopneas and snoring.  IMPRESSIONS-RECOMMENDATIONS: 1. Moderate-to-severe obstructive sleep apnea with hypopneas     predominantly during REM sleep  causing sleep fragmentation and     oxygen desaturation.  This was corrected by CPAP of 16 cm with the     medium fullface mask. 2. No evidence of cardiac arrhythmias, limb movements, or behavioral     disturbance during sleep.  RECOMMENDATIONS: 1. The treatment options for this degree of sleep-disordered breathing     includes CPAP therapy and weight loss. 2. CPAP should be initiated at 16 cm with humidity and a medium     fullface mask.  Compliance should be monitored at this level. 3. He should be cautioned against driving when sleepy.  He should be     asked to avoid medications with sedative side effects.     Oretha Milch, MD    RVA/MEDQ  D:  12/31/2011 10:34:33  T:  01/01/2012 00:05:07  Job:  [id number]

## 2012-01-04 NOTE — Telephone Encounter (Signed)
I spoke with patient about results and he verbalized understanding and had no questions 

## 2012-01-08 ENCOUNTER — Telehealth: Payer: Self-pay | Admitting: Pulmonary Disease

## 2012-01-08 DIAGNOSIS — G4733 Obstructive sleep apnea (adult) (pediatric): Secondary | ICD-10-CM

## 2012-01-08 NOTE — Telephone Encounter (Signed)
He can get a refurbished machine from [facility] for lower cost & payment plan  - the other option is oral applaince but this will -not be as effective  -will also cost him a bit.  CPAP probably best option Can discuss further on OV - but pl put him in touch with [facility]

## 2012-01-08 NOTE — Telephone Encounter (Signed)
I see pt was set up with choice. I spoke [place] about this. She stated to send over to her and we will see what can be done. Order sent to pcc's. Please advise so we can help pt out pcc's. Thanks pt aware we are working on this

## 2012-01-08 NOTE — Telephone Encounter (Signed)
I spoke with pt. He stated the CPAP is too expensive. He can not afford this. Is there another alternative to treat OSA? Please advise Dr. Vassie Loll thanks

## 2012-01-08 NOTE — Telephone Encounter (Signed)
Order given to [email or web address]  to see if [facility] can help this pt with areferbished cpap Tobe Sos

## 2012-01-22 ENCOUNTER — Ambulatory Visit (INDEPENDENT_AMBULATORY_CARE_PROVIDER_SITE_OTHER): Payer: BC Managed Care – PPO | Admitting: Pulmonary Disease

## 2012-01-22 ENCOUNTER — Encounter: Payer: Self-pay | Admitting: Pulmonary Disease

## 2012-01-22 VITALS — BP 122/90 | HR 90 | Temp 98.2°F | Ht 75.0 in | Wt 378.4 lb

## 2012-01-22 DIAGNOSIS — G4733 Obstructive sleep apnea (adult) (pediatric): Secondary | ICD-10-CM

## 2012-01-22 NOTE — Patient Instructions (Addendum)
Turn in the card at 4 weeks so we can give you feedback Your CPAP is set at 16 cm

## 2012-01-22 NOTE — Progress Notes (Signed)
  Subjective:    Patient ID: Colin Bennett, male    DOB: 11/13/1980, 32 y.o.   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  Epworth sleepiness score is 16/24 . During a recent hospitalization and 713 for renal calculi nurses noted drop in oxygen saturation.  He gained 40 pounds in the last 2 years his current weight of 349 pounds. Not seem to have defined shifts at work. He may work from 7 PM to noon the next day and that 3-4 hours of sleep in his truck. Thus, his bedtime and wake up time varies. Sleep latency is minimal, he has 2- 3 awakenings including nocturia and gasping episodes. Wakes up about 6 hours after going to bed still feeling tired, with a dry mouth but denies headaches.    01/22/2012 pt has been on cpap x 1 week. couple days he has felt tired during the day but does feel better than he did. still getting use to CPAP.  PSG showed mod OSA- AHI 17/h, nadir desatn 77%, corrected by CPAP 16 cm  pt has been on cpap x 1 week. couple days he has felt tired during the day but does feel better than he did. still getting use to CPAP.  Mask ok, pressure ok Denies smoking, alcohol use or excessive caffeinated beverages  There is no history suggestive of cataplexy, sleep paralysis or parasomnias   Review of Systems neg for any significant sore throat, dysphagia, itching, sneezing, nasal congestion or excess/ purulent secretions, fever, chills, sweats, unintended wt loss, pleuritic or exertional cp, hempoptysis, orthopnea pnd or change in chronic leg swelling. Also denies presyncope, palpitations, heartburn, abdominal pain, nausea, vomiting, diarrhea or change in bowel or urinary habits, dysuria,hematuria, rash, arthralgias, visual complaints, headache, numbness weakness or ataxia.     Objective:   Physical Exam  Gen. Pleasant, obese, in no distress ENT - no lesions, no post nasal drip Neck: No JVD, no thyromegaly, no carotid bruits Lungs: no  use of accessory muscles, no dullness to percussion, decreased without rales or rhonchi  Cardiovascular: Rhythm regular, heart sounds  normal, no murmurs or gallops, no peripheral edema Musculoskeletal: No deformities, no cyanosis or clubbing , no tremors       Assessment & Plan:

## 2012-01-24 NOTE — Assessment & Plan Note (Signed)
mod OSA- AHI 17/h, nadir desatn 77%, corrected by CPAP 16 cm  Review download for leak esp with his beard & compliance Weight loss encouraged, compliance with goal of at least 4-6 hrs every night is the expectation. Advised against medications with sedative side effects Cautioned against driving when sleepy - understanding that sleepiness will vary on a day to day basis

## 2012-01-29 ENCOUNTER — Telehealth: Payer: Self-pay | Admitting: *Deleted

## 2012-01-29 DIAGNOSIS — E119 Type 2 diabetes mellitus without complications: Secondary | ICD-10-CM

## 2012-01-29 NOTE — Telephone Encounter (Signed)
Pt presented to the lab, orders entered per 10/2011 office note as below:  Please schedule fasting labs prior to next visit  A1c, chem7, lipid-250.00

## 2012-01-30 LAB — LIPID PANEL
HDL: 34 mg/dL — ABNORMAL LOW (ref >39)
LDL Cholesterol: 113 mg/dL — ABNORMAL HIGH (ref 0–99)
Triglycerides: 112 mg/dL (ref <150)
VLDL: 22 mg/dL (ref 0–40)

## 2012-01-30 LAB — BASIC METABOLIC PANEL
Chloride: 101 mEq/L (ref 96–112)
Potassium: 4 mEq/L (ref 3.5–5.3)
Sodium: 137 mEq/L (ref 135–145)

## 2012-01-30 LAB — HEMOGLOBIN A1C
Hgb A1c MFr Bld: 9.2 % — ABNORMAL HIGH (ref <5.7)
Mean Plasma Glucose: 217 mg/dL — ABNORMAL HIGH (ref <117)

## 2012-02-01 ENCOUNTER — Ambulatory Visit (INDEPENDENT_AMBULATORY_CARE_PROVIDER_SITE_OTHER): Payer: BC Managed Care – PPO | Admitting: Internal Medicine

## 2012-02-01 ENCOUNTER — Encounter: Payer: Self-pay | Admitting: Internal Medicine

## 2012-02-01 VITALS — BP 128/90 | HR 106 | Temp 98.3°F | Resp 18 | Wt 380.8 lb

## 2012-02-01 DIAGNOSIS — E785 Hyperlipidemia, unspecified: Secondary | ICD-10-CM

## 2012-02-01 DIAGNOSIS — Z23 Encounter for immunization: Secondary | ICD-10-CM

## 2012-02-01 DIAGNOSIS — E119 Type 2 diabetes mellitus without complications: Secondary | ICD-10-CM

## 2012-02-01 NOTE — Patient Instructions (Signed)
Please schedule fasting labs prior to next visit Chem7, a1c-250.00 and lipid/lft-272.4 

## 2012-02-11 DIAGNOSIS — E785 Hyperlipidemia, unspecified: Secondary | ICD-10-CM | POA: Insufficient documentation

## 2012-02-11 HISTORY — DX: Hyperlipidemia, unspecified: E78.5

## 2012-02-11 NOTE — Assessment & Plan Note (Signed)
Low fat diet/exercise and wt loss. Recheck lipid with next visit. If remains elevated then begin statin tx.

## 2012-02-11 NOTE — Assessment & Plan Note (Signed)
Worsening control after initial improvement. Take amaryl QD and call fsbs log in 2wks. Diabetic diet, exercise and weight loss.

## 2012-02-11 NOTE — Progress Notes (Signed)
  Subjective:    Patient ID: Colin Bennett, male    DOB: 1980-03-05, 32 y.o.   MRN: [id number]  HPI Pt presents to clinic for followup of multiple medical problems. a1c elevated since last visit. States taking amaryl 1-2x/wk. ldl reviewed mildly elevated.  Past Medical History  Diagnosis Date  . Kidney stones   . Diabetes mellitus   . Bipolar 1 disorder    No past surgical history on file.  reports that he has never smoked. He has never used smokeless tobacco. He reports that he does not drink alcohol or use illicit drugs. family history includes Diabetes in his other and Hypertension in his other. No Known Allergies    Review of Systems see hpi     Objective:   Physical Exam  Physical Exam  Nursing note and vitals reviewed. Constitutional: Appears well-developed and well-nourished. No distress.  HENT:  Head: Normocephalic and atraumatic.  Right Ear: External ear normal.  Left Ear: External ear normal.  Eyes: Conjunctivae are normal. No scleral icterus.  Neck: Neck supple. Carotid bruit is not present.  Cardiovascular: Normal rate, regular rhythm and normal heart sounds.  Exam reveals no gallop and no friction rub.   No murmur heard. Pulmonary/Chest: Effort normal and breath sounds normal. No respiratory distress. He has no wheezes. no rales.  Lymphadenopathy:    He has no cervical adenopathy.  Neurological:Alert.  Skin: Skin is warm and dry. Not diaphoretic.  Psychiatric: Has a normal mood and affect.        Assessment & Plan:

## 2012-02-24 ENCOUNTER — Telehealth: Payer: Self-pay | Admitting: Pulmonary Disease

## 2012-02-24 NOTE — Telephone Encounter (Signed)
lmomtcb x1 

## 2012-02-24 NOTE — Telephone Encounter (Signed)
Download 1/14 - on cpap 16 cm - no residuals, leak ok with beard - CPAP very effective

## 2012-02-25 NOTE — Telephone Encounter (Signed)
Pt returned Mindy's call & can be reached at [phone].  Antionette Fairy

## 2012-02-26 NOTE — Telephone Encounter (Signed)
I spoke with patient about results and he verbalized understanding and had no questions 

## 2012-03-01 ENCOUNTER — Encounter: Payer: Self-pay | Admitting: Pulmonary Disease

## 2012-03-22 ENCOUNTER — Telehealth: Payer: Self-pay | Admitting: Pulmonary Disease

## 2012-03-22 NOTE — Telephone Encounter (Signed)
Download on CPAP 16 cm - on residuals  Much improved Ct current settings 6h every night recomm

## 2012-03-24 ENCOUNTER — Ambulatory Visit (INDEPENDENT_AMBULATORY_CARE_PROVIDER_SITE_OTHER): Payer: BC Managed Care – PPO | Admitting: Family

## 2012-03-24 ENCOUNTER — Encounter: Payer: Self-pay | Admitting: Family

## 2012-03-24 VITALS — BP 126/90 | HR 92 | Temp 98.1°F | Resp 16 | Wt 380.1 lb

## 2012-03-24 DIAGNOSIS — M79602 Pain in left arm: Secondary | ICD-10-CM

## 2012-03-24 DIAGNOSIS — M79603 Pain in arm, unspecified: Secondary | ICD-10-CM | POA: Insufficient documentation

## 2012-03-24 MED ORDER — MELOXICAM 7.5 MG PO TABS: 7.5000 mg | ORAL_TABLET | Freq: Every day | ORAL | Status: DC

## 2012-03-24 NOTE — Patient Instructions (Addendum)
Please apply ice to the affected area twice daily. Follow up in 2 weeks.

## 2012-03-24 NOTE — Assessment & Plan Note (Signed)
Attempt short course of meloxicam and ice bid. CTS is a possible contributing factor.  He is instructed to call if increased pain, swelling, redness or fever occurs. Pt verbalizes understanding.
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
## 2012-03-28 ENCOUNTER — Encounter: Payer: Self-pay | Admitting: Pulmonary Disease

## 2012-03-28 ENCOUNTER — Ambulatory Visit (INDEPENDENT_AMBULATORY_CARE_PROVIDER_SITE_OTHER): Payer: BC Managed Care – PPO | Admitting: Pulmonary Disease

## 2012-03-28 VITALS — BP 122/84 | HR 101 | Temp 98.7°F | Ht 72.5 in | Wt 382.0 lb

## 2012-03-28 DIAGNOSIS — G4733 Obstructive sleep apnea (adult) (pediatric): Secondary | ICD-10-CM

## 2012-03-28 NOTE — Patient Instructions (Signed)
Your CPAP is set at 16 cm Call us as needed

## 2012-03-28 NOTE — Assessment & Plan Note (Signed)
mod OSA- AHI 17/h, nadir desatn 77%, corrected by CPAP 16 cm  Weight loss encouraged, compliance with goal of at least 4-6 hrs every night is the expectation. Advised against medications with sedative side effects Cautioned against driving when sleepy - understanding that sleepiness will vary on a day to day basis

## 2012-03-28 NOTE — Progress Notes (Signed)
  Subjective:    Patient ID: Colin Bennett, male    DOB: [birth year], 32 y.o.   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  Epworth sleepiness score is 16/24 . During a recent hospitalization and 713 for renal calculi nurses noted drop in oxygen saturation.  He gained 40 pounds in the last 2 years his current weight of 349 pounds. Not seem to have defined shifts at work. He may work from 7 PM to noon the next day and that 3-4 hours of sleep in his truck. Thus, his bedtime and wake up time varies. Sleep latency is minimal, he has 2- 3 awakenings including nocturia and gasping episodes. Wakes up about 6 hours after going to bed still feeling tired, with a dry mouth but denies headaches.   PSG showed mod OSA- AHI 17/h, nadir desatn 77%, corrected by CPAP 16 cm       03/28/2012 Download 1/14 - on cpap 16 cm - no residuals, leak ok with beard - CPAP very effective 3/14 Download on CPAP 16 cm - no residuals  Much improved Currently using CPAP every night for 4-5 hours. Reports problems with the mask leaking. Denies problems with the machine or pressure. Mask ok, pressure ok  He feels sleepy if he uses CPAP > 6h   Review of Systems neg for any significant sore throat, dysphagia, itching, sneezing, nasal congestion or excess/ purulent secretions, fever, chills, sweats, unintended wt loss, pleuritic or exertional cp, hempoptysis, orthopnea pnd or change in chronic leg swelling. Also denies presyncope, palpitations, heartburn, abdominal pain, nausea, vomiting, diarrhea or change in bowel or urinary habits, dysuria,hematuria, rash, arthralgias, visual complaints, headache, numbness weakness or ataxia.     Objective:   Physical Exam  Gen. Pleasant, obese, in no distress ENT - no lesions, no post nasal drip Neck: No JVD, no thyromegaly, no carotid bruits Lungs: no use of accessory muscles, no dullness to percussion, decreased without rales or rhonchi   Cardiovascular: Rhythm regular, heart sounds  normal, no murmurs or gallops, no peripheral edema Musculoskeletal: No deformities, no cyanosis or clubbing , no tremors        Assessment & Plan:

## 2012-03-28 NOTE — Telephone Encounter (Signed)
Pt came in for  OV today. Will sign off message

## 2012-04-04 ENCOUNTER — Ambulatory Visit (INDEPENDENT_AMBULATORY_CARE_PROVIDER_SITE_OTHER): Payer: BC Managed Care – PPO | Admitting: Family

## 2012-04-04 ENCOUNTER — Encounter: Payer: Self-pay | Admitting: Family

## 2012-04-04 VITALS — BP 130/100 | HR 77 | Temp 97.6°F | Resp 18 | Wt 384.1 lb

## 2012-04-04 DIAGNOSIS — M79609 Pain in unspecified limb: Secondary | ICD-10-CM

## 2012-04-04 NOTE — Assessment & Plan Note (Signed)
Improving. Recommend that pt use left wrist brace at night while sleeping, tylenol prn pain.  If symptoms worsen, he will let me know and we will plan referral to orthopedics.

## 2012-04-04 NOTE — Progress Notes (Signed)
  Subjective:    Patient ID: Colin Bennett, male    DOB: Nov 29, 1980, 32 y.o.   MRN: [id number]  HPI  Colin Bennett is s 32 yr old male who presents today with chief complaint of musculoskeletal left arm pain. He was initially seen on 3/6 for same and was given trial of meloxicam. Reports some difficulty rotating his left hand to wash his hands.  He would like to hold off on ortho referral.    Review of Systems See HPI      Objective:   Physical Exam  Constitutional: He appears well-developed and well-nourished. No distress.  Cardiovascular: Normal rate and regular rhythm.   No murmur heard. Pulmonary/Chest: Effort normal and breath sounds normal. No respiratory distress. He has no wheezes. He has no rales. He exhibits no tenderness.  Musculoskeletal:  No left wrist tenderness to palpation. Mild pain with phalans left.          Assessment & Plan:

## 2012-04-04 NOTE — Patient Instructions (Addendum)
Please call if you decide you would like a referral to orthopedics.   Wear wrist brace at night and as you are able throughout the day. You may use tylenol fo rpain.  Please follow up in 1 month.

## 2012-05-02 ENCOUNTER — Ambulatory Visit: Payer: BC Managed Care – PPO | Admitting: Family Medicine

## 2012-05-09 ENCOUNTER — Encounter: Payer: Self-pay | Admitting: *Deleted

## 2012-05-09 ENCOUNTER — Telehealth: Payer: Self-pay | Admitting: *Deleted

## 2012-05-09 DIAGNOSIS — E785 Hyperlipidemia, unspecified: Secondary | ICD-10-CM

## 2012-05-09 DIAGNOSIS — E119 Type 2 diabetes mellitus without complications: Secondary | ICD-10-CM

## 2012-05-09 LAB — LIPID PANEL
LDL Cholesterol: 100 mg/dL — ABNORMAL HIGH (ref 0–99)
Total CHOL/HDL Ratio: 4.8 Ratio
Triglycerides: 118 mg/dL (ref <150)
VLDL: 24 mg/dL (ref 0–40)

## 2012-05-09 LAB — BASIC METABOLIC PANEL
CO2: 25 mEq/L (ref 19–32)
Calcium: 9.4 mg/dL (ref 8.4–10.5)
Creat: 0.82 mg/dL (ref 0.50–1.35)
Sodium: 140 mEq/L (ref 135–145)

## 2012-05-09 LAB — HEPATIC FUNCTION PANEL
ALT: 28 U/L (ref 0–53)
Indirect Bilirubin: 0.3 mg/dL (ref 0.0–0.9)
Total Protein: 7 g/dL (ref 6.0–8.3)

## 2012-05-09 NOTE — Telephone Encounter (Signed)
Pt presented to the lab, orders entered per 01/2011 phone note as below:  Please schedule fasting labs prior to next visit  Chem7, a1c-250.00 and lipid/lft-272.4

## 2012-05-10 ENCOUNTER — Ambulatory Visit: Payer: BC Managed Care – PPO | Admitting: Family Medicine

## 2012-05-10 ENCOUNTER — Encounter: Payer: Self-pay | Admitting: Family Medicine

## 2012-05-10 ENCOUNTER — Ambulatory Visit (INDEPENDENT_AMBULATORY_CARE_PROVIDER_SITE_OTHER): Payer: BC Managed Care – PPO | Admitting: Family Medicine

## 2012-05-10 VITALS — BP 148/118 | HR 101 | Temp 98.2°F | Ht 75.0 in | Wt 369.0 lb

## 2012-05-10 DIAGNOSIS — I10 Essential (primary) hypertension: Secondary | ICD-10-CM

## 2012-05-10 DIAGNOSIS — E119 Type 2 diabetes mellitus without complications: Secondary | ICD-10-CM

## 2012-05-10 DIAGNOSIS — M545 Low back pain, unspecified: Secondary | ICD-10-CM

## 2012-05-10 DIAGNOSIS — Z0000 Encounter for general adult medical examination without abnormal findings: Secondary | ICD-10-CM

## 2012-05-10 DIAGNOSIS — E785 Hyperlipidemia, unspecified: Secondary | ICD-10-CM

## 2012-05-10 DIAGNOSIS — G4733 Obstructive sleep apnea (adult) (pediatric): Secondary | ICD-10-CM

## 2012-05-10 HISTORY — DX: Essential (primary) hypertension: I10

## 2012-05-10 LAB — HEMOGLOBIN A1C
Hgb A1c MFr Bld: 6.8 % — ABNORMAL HIGH (ref <5.7)
Mean Plasma Glucose: 148 mg/dL — ABNORMAL HIGH (ref <117)

## 2012-05-10 MED ORDER — LISINOPRIL 5 MG PO TABS: 5.0000 mg | ORAL_TABLET | Freq: Every day | ORAL | Status: DC

## 2012-05-10 MED ORDER — CYCLOBENZAPRINE HCL 10 MG PO TABS: 10.0000 mg | ORAL_TABLET | Freq: Every evening | ORAL | Status: DC | PRN

## 2012-05-10 NOTE — Patient Instructions (Addendum)
Labs prior to visit, lipid, renal cbc, tsh, hgba1c, hepatic  Renal panel in 4-6 weeks   Hypertension As your heart beats, it forces blood through your arteries. This force is your blood pressure. If the pressure is too high, it is called hypertension (HTN) or high blood pressure. HTN is dangerous because you may have it and not know it. High blood pressure may mean that your heart has to work harder to pump blood. Your arteries may be narrow or stiff. The extra work puts you at risk for heart disease, stroke, and other problems.  Blood pressure consists of two numbers, a higher number over a lower, 110/72, for example. It is stated as "110 over 72." The ideal is below 120 for the top number (systolic) and under 80 for the bottom (diastolic). Write down your blood pressure today. You should pay close attention to your blood pressure if you have certain conditions such as:  Heart failure.  Prior heart attack.  Diabetes  Chronic kidney disease.  Prior stroke.  Multiple risk factors for heart disease. To see if you have HTN, your blood pressure should be measured while you are seated with your arm held at the level of the heart. It should be measured at least twice. A one-time elevated blood pressure reading (especially in the Emergency Department) does not mean that you need treatment. There may be conditions in which the blood pressure is different between your right and left arms. It is important to see your caregiver soon for a recheck. Most people have essential hypertension which means that there is not a specific cause. This type of high blood pressure may be lowered by changing lifestyle factors such as:  Stress.  Smoking.  Lack of exercise.  Excessive weight.  Drug/tobacco/alcohol use.  Eating less salt. Most people do not have symptoms from high blood pressure until it has caused damage to the body. Effective treatment can often prevent, delay or reduce that  damage. TREATMENT  When a cause has been identified, treatment for high blood pressure is directed at the cause. There are a large number of medications to treat HTN. These fall into several categories, and your caregiver will help you select the medicines that are best for you. Medications may have side effects. You should review side effects with your caregiver. If your blood pressure stays high after you have made lifestyle changes or started on medicines,   Your medication(s) may need to be changed.  Other problems may need to be addressed.  Be certain you understand your prescriptions, and know how and when to take your medicine.  Be sure to follow up with your caregiver within the time frame advised (usually within two weeks) to have your blood pressure rechecked and to review your medications.  If you are taking more than one medicine to lower your blood pressure, make sure you know how and at what times they should be taken. Taking two medicines at the same time can result in blood pressure that is too low. SEEK IMMEDIATE MEDICAL CARE IF:  You develop a severe headache, blurred or changing vision, or confusion.  You have unusual weakness or numbness, or a faint feeling.  You have severe chest or abdominal pain, vomiting, or breathing problems. MAKE SURE YOU:   Understand these instructions.  Will watch your condition.  Will get help right away if you are not doing well or get worse. Document Released: 01/05/2005 Document Revised: 03/30/2011 Document Reviewed: 08/26/2007 [employer] Patient Information 2013  [employer]

## 2012-05-10 NOTE — Assessment & Plan Note (Signed)
Patient considering buying the cpap machine directly instead of continuing with [facility]

## 2012-05-10 NOTE — Assessment & Plan Note (Signed)
Numbers greatly improved since last blood draw. Discussed need to eat small, frequent meals with lean proteins and healthy fats. No change in meds. Encouraged increased exercise. Amaryl 4 mg daily, does take an occasional second dose a day when he over eats.

## 2012-05-10 NOTE — Assessment & Plan Note (Signed)
Persistently elevated will start Lisinopril 5 mg daily

## 2012-05-14 ENCOUNTER — Encounter: Payer: Self-pay | Admitting: Family Medicine

## 2012-05-14 DIAGNOSIS — Z0000 Encounter for general adult medical examination without abnormal findings: Secondary | ICD-10-CM

## 2012-05-14 HISTORY — DX: Encounter for general adult medical examination without abnormal findings: Z00.00

## 2012-05-14 NOTE — Assessment & Plan Note (Signed)
Mild, stable avoid trans fats, add krill oil increase exercise

## 2012-05-14 NOTE — Assessment & Plan Note (Signed)
Encouraged DASH diet, increased exercise, small frequent meals with lean proteins and complex carbs. 8 hours of sleep, reviewed labs

## 2012-05-14 NOTE — Progress Notes (Signed)
Patient ID: Colin Bennett, male   DOB: 1980/11/16, 32 y.o.   MRN: [id number] Colin Bennett [id number] [birth year] 05/14/2012      Progress Note-Follow Up  Subjective  Chief Complaint  Chief Complaint  Patient presents with  . Annual Exam    physical    HPI  Patient is a 32 male who is in today for annual exam. He denies any recent illness or acute concerns but notes his workplace we'll give him at this time and his health insurance he comes in. He is using his sleep apnea machine. He is trying to decrease his carbs since being diagnosed with diabetes but does not check his sugars routinely. Denies polyuria or polydipsia. Denies chest pain, recent illness, headaches, shortness of breath, palpitations, GI or GU concerns at this time.  Past Medical History  Diagnosis Date  . Kidney stones   . Diabetes mellitus   . Bipolar 1 disorder   . HTN (hypertension) 05/10/2012  . Preventative health care 05/14/2012    History reviewed. No pertinent past surgical history.  Family History  Problem Relation Age of Onset  . Hypertension Other   . Diabetes Other     History   Social History  . Marital Status: Married    Spouse Name: N/A    Number of Children: 0  . Years of Education: N/A   Occupational History  . Collateral recovery    Social History Main Topics  . Smoking status: Never Smoker   . Smokeless tobacco: Never Used  . Alcohol Use: No  . Drug Use: No  . Sexually Active: Not on file   Other Topics Concern  . Not on file   Social History Narrative  . No narrative on file    Current Outpatient Prescriptions on File Prior to Visit  Medication Sig Dispense Refill  . glimepiride (AMARYL) 4 MG tablet Take 1 tablet (4 mg total) by mouth daily before breakfast.  30 tablet  3  . lamoTRIgine (LAMICTAL) 25 MG tablet Take 75 mg by mouth daily.       Marland Kitchen venlafaxine XR (EFFEXOR-XR) 37.5 MG 24 hr capsule Take 75 mg by mouth daily.        No current facility-administered  medications on file prior to visit.    No Known Allergies  Review of Systems  Review of Systems  Constitutional: Negative for fever, chills and malaise/fatigue.  HENT: Negative for hearing loss, nosebleeds and congestion.   Eyes: Negative for discharge.  Respiratory: Negative for cough, sputum production, shortness of breath and wheezing.   Cardiovascular: Negative for chest pain, palpitations and leg swelling.  Gastrointestinal: Negative for heartburn, nausea, vomiting, abdominal pain, diarrhea, constipation and blood in stool.  Genitourinary: Negative for dysuria, urgency, frequency and hematuria.  Musculoskeletal: Negative for myalgias, back pain and falls.  Skin: Negative for rash.  Neurological: Negative for dizziness, tremors, sensory change, focal weakness, loss of consciousness, weakness and headaches.  Endo/Heme/Allergies: Negative for polydipsia. Does not bruise/bleed easily.  Psychiatric/Behavioral: Negative for depression and suicidal ideas. The patient is not nervous/anxious and does not have insomnia.     Objective  BP 148/118  Pulse 101  Temp(Src) 98.2 F (36.8 C) (Oral)  Ht 6\' 3"  (1.905 m)  Wt 369 lb 0.6 oz (167.395 kg)  BMI 46.13 kg/m2  SpO2 94%  Physical Exam  Physical Exam  Constitutional: He is oriented to person, place, and time and well-developed, well-nourished, and in no distress. No distress.  HENT:  Head: Normocephalic  and atraumatic.  Eyes: Conjunctivae are normal.  Neck: Neck supple. No thyromegaly present.  Cardiovascular: Normal rate, regular rhythm and normal heart sounds.   No murmur heard. Pulmonary/Chest: Effort normal and breath sounds normal. No respiratory distress.  Abdominal: He exhibits no distension and no mass. There is no tenderness.  Musculoskeletal: He exhibits no edema.  Neurological: He is alert and oriented to person, place, and time.  Skin: Skin is warm.  Psychiatric: Memory, affect and judgment normal.    Lab Results   Component Value Date   TSH 1.167 11/02/2011   Lab Results  Component Value Date   WBC 10.7* 11/02/2011   HGB 16.6 11/02/2011   HCT 48.2 11/02/2011   MCV 86.1 11/02/2011   PLT 274 11/02/2011   Lab Results  Component Value Date   CREATININE 0.82 05/09/2012   BUN 13 05/09/2012   NA 140 05/09/2012   K 4.1 05/09/2012   CL 105 05/09/2012   CO2 25 05/09/2012   Lab Results  Component Value Date   ALT 28 05/09/2012   AST 19 05/09/2012   ALKPHOS 78 05/09/2012   BILITOT 0.4 05/09/2012   Lab Results  Component Value Date   CHOL 157 05/09/2012   Lab Results  Component Value Date   HDL 33* 05/09/2012   Lab Results  Component Value Date   LDLCALC 100* 05/09/2012   Lab Results  Component Value Date   TRIG 118 05/09/2012   Lab Results  Component Value Date   CHOLHDL 4.8 05/09/2012     Assessment & Plan  DM (diabetes mellitus) Numbers greatly improved since last blood draw. Discussed need to eat small, frequent meals with lean proteins and healthy fats. No change in meds. Encouraged increased exercise. Amaryl 4 mg daily, does take an occasional second dose a day when he over eats.  HTN (hypertension) Persistently elevated will start Lisinopril 5 mg daily  OSA (obstructive sleep apnea) Patient considering buying the cpap machine directly instead of continuing with [facility]  Hyperlipidemia Mild, stable avoid trans fats, add krill oil increase exercise  Preventative health care Encouraged DASH diet, increased exercise, small frequent meals with lean proteins and complex carbs. 8 hours of sleep, reviewed labs

## 2012-05-24 ENCOUNTER — Other Ambulatory Visit: Payer: Self-pay

## 2012-05-24 MED ORDER — GLIMEPIRIDE 4 MG PO TABS: 4.0000 mg | ORAL_TABLET | Freq: Every day | ORAL | Status: DC

## 2012-07-07 ENCOUNTER — Other Ambulatory Visit: Payer: Self-pay

## 2012-07-07 NOTE — Telephone Encounter (Signed)
We received paperwork from [facility] for test strips.  I spoke to Colin Bennett and he has never spoke to this company and does not want these

## 2012-08-05 ENCOUNTER — Telehealth: Payer: Self-pay | Admitting: *Deleted

## 2012-08-05 DIAGNOSIS — E785 Hyperlipidemia, unspecified: Secondary | ICD-10-CM

## 2012-08-05 DIAGNOSIS — E119 Type 2 diabetes mellitus without complications: Secondary | ICD-10-CM

## 2012-08-05 DIAGNOSIS — I10 Essential (primary) hypertension: Secondary | ICD-10-CM

## 2012-08-05 LAB — CBC
Hemoglobin: 15.4 g/dL (ref 13.0–17.0)
Platelets: 257 10*3/uL (ref 150–400)
RBC: 5.21 MIL/uL (ref 4.22–5.81)
WBC: 9 10*3/uL (ref 4.0–10.5)

## 2012-08-05 NOTE — Telephone Encounter (Signed)
Pt presented to the lab. Orders entered per 05/10/12 as below:  Labs prior to visit, lipid, renal cbc, tsh, hgba1c, hepatic

## 2012-08-06 LAB — RENAL FUNCTION PANEL
Albumin: 4 g/dL (ref 3.5–5.2)
Calcium: 9.5 mg/dL (ref 8.4–10.5)
Glucose, Bld: 114 mg/dL — ABNORMAL HIGH (ref 70–99)
Phosphorus: 2.4 mg/dL (ref 2.3–4.6)
Potassium: 4 mEq/L (ref 3.5–5.3)
Sodium: 137 mEq/L (ref 135–145)

## 2012-08-06 LAB — TSH: TSH: 0.848 u[IU]/mL (ref 0.350–4.500)

## 2012-08-06 LAB — HEPATIC FUNCTION PANEL
AST: 17 U/L (ref 0–37)
Albumin: 4 g/dL (ref 3.5–5.2)
Alkaline Phosphatase: 69 U/L (ref 39–117)
Indirect Bilirubin: 0.3 mg/dL (ref 0.0–0.9)
Total Bilirubin: 0.4 mg/dL (ref 0.3–1.2)

## 2012-08-06 LAB — HEMOGLOBIN A1C
Hgb A1c MFr Bld: 6.5 % — ABNORMAL HIGH (ref <5.7)
Mean Plasma Glucose: 140 mg/dL — ABNORMAL HIGH (ref <117)

## 2012-08-06 LAB — LIPID PANEL
HDL: 34 mg/dL — ABNORMAL LOW (ref >39)
LDL Cholesterol: 116 mg/dL — ABNORMAL HIGH (ref 0–99)
Total CHOL/HDL Ratio: 5.4 Ratio
VLDL: 34 mg/dL (ref 0–40)

## 2012-08-08 ENCOUNTER — Encounter: Payer: Self-pay | Admitting: Family Medicine

## 2012-08-08 ENCOUNTER — Ambulatory Visit (INDEPENDENT_AMBULATORY_CARE_PROVIDER_SITE_OTHER): Payer: BC Managed Care – PPO | Admitting: Family Medicine

## 2012-08-08 VITALS — BP 118/92 | HR 94 | Temp 98.3°F | Ht 75.0 in | Wt 380.1 lb

## 2012-08-08 DIAGNOSIS — M549 Dorsalgia, unspecified: Secondary | ICD-10-CM

## 2012-08-08 DIAGNOSIS — E785 Hyperlipidemia, unspecified: Secondary | ICD-10-CM

## 2012-08-08 DIAGNOSIS — N200 Calculus of kidney: Secondary | ICD-10-CM

## 2012-08-08 DIAGNOSIS — E119 Type 2 diabetes mellitus without complications: Secondary | ICD-10-CM

## 2012-08-08 DIAGNOSIS — I10 Essential (primary) hypertension: Secondary | ICD-10-CM

## 2012-08-08 DIAGNOSIS — G4733 Obstructive sleep apnea (adult) (pediatric): Secondary | ICD-10-CM

## 2012-08-08 DIAGNOSIS — F319 Bipolar disorder, unspecified: Secondary | ICD-10-CM

## 2012-08-08 MED ORDER — LISINOPRIL 10 MG PO TABS: 10.0000 mg | ORAL_TABLET | Freq: Every day | ORAL | Status: DC

## 2012-08-08 MED ORDER — PRAVASTATIN SODIUM 10 MG PO TABS: 10.0000 mg | ORAL_TABLET | Freq: Every day | ORAL | Status: DC

## 2012-08-08 MED ORDER — METHOCARBAMOL 500 MG PO TABS: 500.0000 mg | ORAL_TABLET | Freq: Three times a day (TID) | ORAL | Status: DC | PRN

## 2012-08-08 NOTE — Patient Instructions (Addendum)
Co Q10 enzyme daily Salon Pas patches  Labs prior lipid, renal, cbc, tsh, hepatic, hgba1c  Cholesterol Cholesterol is a white, waxy, fat-like protein needed by your body in small amounts. The liver makes all the cholesterol you need. It is carried from the liver by the blood through the blood vessels. Deposits (plaque) may build up on blood vessel walls. This makes the arteries narrower and stiffer. Plaque increases the risk for heart attack and stroke. You cannot feel your cholesterol level even if it is very high. The only way to know is by a blood test to check your lipid (fats) levels. Once you know your cholesterol levels, you should keep a record of the test results. Work with your caregiver to to keep your levels in the desired range. WHAT THE RESULTS MEAN:  Total cholesterol is a rough measure of all the cholesterol in your blood.  LDL is the so-called bad cholesterol. This is the type that deposits cholesterol in the walls of the arteries. You want this level to be low.  HDL is the good cholesterol because it cleans the arteries and carries the LDL away. You want this level to be high.  Triglycerides are fat that the body can either burn for energy or store. High levels are closely linked to heart disease. DESIRED LEVELS:  Total cholesterol below 200.  LDL below 100 for people at risk, below 70 for very high risk.  HDL above 50 is good, above 60 is best.  Triglycerides below 150. HOW TO LOWER YOUR CHOLESTEROL:  Diet.  Choose fish or white meat chicken and [place], roasted or baked. Limit fatty cuts of red meat, fried foods, and processed meats, such as sausage and lunch meat.  Eat lots of fresh fruits and vegetables. Choose whole grains, beans, pasta, potatoes and cereals.  Use only small amounts of olive, corn or canola oils. Avoid butter, mayonnaise, shortening or palm kernel oils. Avoid foods with trans-fats.  Use skim/nonfat milk and low-fat/nonfat yogurt and cheeses.  Avoid whole milk, cream, ice cream, egg yolks and cheeses. Healthy desserts include angel food cake, ginger snaps, animal crackers, hard candy, popsicles, and low-fat/nonfat frozen yogurt. Avoid pastries, cakes, pies and cookies.  Exercise.  A regular program helps decrease LDL and raises HDL.  Helps with weight control.  Do things that increase your activity level like gardening, walking, or taking the stairs.  Medication.  May be prescribed by your caregiver to help lowering cholesterol and the risk for heart disease.  You may need medicine even if your levels are normal if you have several risk factors. HOME CARE INSTRUCTIONS   Follow your diet and exercise programs as suggested by your caregiver.  Take medications as directed.  Have blood work done when your caregiver feels it is necessary. MAKE SURE YOU:   Understand these instructions.  Will watch your condition.  Will get help right away if you are not doing well or get worse. Document Released: 09/30/2000 Document Revised: 03/30/2011 Document Reviewed: 03/23/2007 [facility] Patient Information 2014 [place], [place].

## 2012-08-10 ENCOUNTER — Encounter: Payer: Self-pay | Admitting: Family Medicine

## 2012-08-10 DIAGNOSIS — N200 Calculus of kidney: Secondary | ICD-10-CM

## 2012-08-10 DIAGNOSIS — F319 Bipolar disorder, unspecified: Secondary | ICD-10-CM

## 2012-08-10 HISTORY — DX: Calculus of kidney: N20.0

## 2012-08-10 HISTORY — DX: Bipolar disorder, unspecified: F31.9

## 2012-08-10 NOTE — Assessment & Plan Note (Signed)
Pravachol 10 mg daily, avoid trans fats, increase exercise.

## 2012-08-10 NOTE — Assessment & Plan Note (Signed)
Improving hgba1c, avoid simple carbs continue current meds.

## 2012-08-10 NOTE — Assessment & Plan Note (Signed)
Well controlled, no changes to meds today

## 2012-08-10 NOTE — Progress Notes (Signed)
Patient ID: Colin Bennett, male   DOB: [birth year], 32 y.o.   MRN: [id number] Colin Bennett [id number] 01/21/1980 08/10/2012      Progress Note-Follow Up  Subjective  Chief Complaint  Chief Complaint  Patient presents with  . Follow-up    3 month    HPI  Patient is a 32 yo Male who is in today for followup. He is generally feeling well. He's not had any recent illness. He denies any recent trips to the ER, fevers, headaches, chest pain, palpitations, shortness of breath, syncope, GI or GU complaints. Notes his blood sugars have generally been between 80 and 1:30. No polyuria or polydipsia. Is taking medications as prescribed.   Past Medical History  Diagnosis Date  . Kidney stones   . Diabetes mellitus   . Bipolar 1 disorder   . HTN (hypertension) 05/10/2012  . Preventative health care 05/14/2012  . Renal lithiasis 08/10/2012  . Bipolar disorder, unspecified 08/10/2012    History reviewed. No pertinent past surgical history.  Family History  Problem Relation Age of Onset  . Hypertension Other   . Diabetes Other     History   Social History  . Marital Status: Married    Spouse Name: N/A    Number of Children: 0  . Years of Education: N/A   Occupational History  . Collateral recovery    Social History Main Topics  . Smoking status: Never Smoker   . Smokeless tobacco: Never Used  . Alcohol Use: No  . Drug Use: No  . Sexually Active: Not on file   Other Topics Concern  . Not on file   Social History Narrative  . No narrative on file    Current Outpatient Prescriptions on File Prior to Visit  Medication Sig Dispense Refill  . glimepiride (AMARYL) 4 MG tablet Take 1 tablet (4 mg total) by mouth daily before breakfast.  30 tablet  6  . lamoTRIgine (LAMICTAL) 25 MG tablet Take 75 mg by mouth daily.       Marland Kitchen venlafaxine XR (EFFEXOR-XR) 37.5 MG 24 hr capsule Take 75 mg by mouth daily.        No current facility-administered medications on file prior to visit.     No Known Allergies  Review of Systems  Review of Systems  Constitutional: Negative for fever and malaise/fatigue.  HENT: Negative for congestion.   Eyes: Negative for pain and discharge.  Respiratory: Negative for shortness of breath.   Cardiovascular: Negative for chest pain, palpitations and leg swelling.  Gastrointestinal: Negative for nausea, abdominal pain and diarrhea.  Genitourinary: Negative for dysuria.  Musculoskeletal: Negative for falls.  Skin: Negative for rash.  Neurological: Negative for loss of consciousness and headaches.  Endo/Heme/Allergies: Negative for polydipsia.  Psychiatric/Behavioral: Negative for depression and suicidal ideas. The patient is not nervous/anxious and does not have insomnia.     Objective  BP 118/92  Pulse 94  Temp(Src) 98.3 F (36.8 C) (Oral)  Ht 6\' 3"  (1.905 m)  Wt 380 lb 1.9 oz (172.421 kg)  BMI 47.51 kg/m2  SpO2 96%  Physical Exam  Physical Exam  Constitutional: He is oriented to person, place, and time and well-developed, well-nourished, and in no distress. No distress.  HENT:  Head: Normocephalic and atraumatic.  Eyes: Conjunctivae are normal.  Neck: Neck supple. No thyromegaly present.  Cardiovascular: Normal rate, regular rhythm and normal heart sounds.   No murmur heard. Pulmonary/Chest: Effort normal and breath sounds normal. No respiratory distress.  Abdominal:  He exhibits no distension and no mass. There is no tenderness.  Musculoskeletal: He exhibits no edema.  Neurological: He is alert and oriented to person, place, and time.  Skin: Skin is warm.  Psychiatric: Memory, affect and judgment normal.    Lab Results  Component Value Date   TSH 0.848 08/05/2012   Lab Results  Component Value Date   WBC 9.0 08/05/2012   HGB 15.4 08/05/2012   HCT 44.1 08/05/2012   MCV 84.6 08/05/2012   PLT 257 08/05/2012   Lab Results  Component Value Date   CREATININE 0.75 08/05/2012   BUN 11 08/05/2012   NA 137 08/05/2012   K  4.0 08/05/2012   CL 105 08/05/2012   CO2 25 08/05/2012   Lab Results  Component Value Date   ALT 28 08/05/2012   AST 17 08/05/2012   ALKPHOS 69 08/05/2012   BILITOT 0.4 08/05/2012   Lab Results  Component Value Date   CHOL 184 08/05/2012   Lab Results  Component Value Date   HDL 34* 08/05/2012   Lab Results  Component Value Date   LDLCALC 116* 08/05/2012   Lab Results  Component Value Date   TRIG 172* 08/05/2012   Lab Results  Component Value Date   CHOLHDL 5.4 08/05/2012     Assessment & Plan  HTN (hypertension) Well controlled, no changes to meds today  DM (diabetes mellitus) Improving hgba1c, avoid simple carbs continue current meds.  Hyperlipidemia Pravachol 10 mg daily, avoid trans fats, increase exercise.  OSA (obstructive sleep apnea) Using CPAP routinely  Bipolar disorder, unspecified Stable on current meds no changes.

## 2012-08-10 NOTE — Assessment & Plan Note (Signed)
Stable on current meds no changes 

## 2012-08-10 NOTE — Assessment & Plan Note (Signed)
Using CPAP routinely 

## 2012-08-12 ENCOUNTER — Telehealth: Payer: Self-pay | Admitting: Pulmonary Disease

## 2012-08-12 NOTE — Telephone Encounter (Signed)
Download reviewed 07/2012 >> on cpap 16 cm, good compliance, no residuals

## 2012-08-15 NOTE — Telephone Encounter (Signed)
I spoke with patient about results and he verbalized understanding and had no questions 

## 2012-09-05 ENCOUNTER — Telehealth: Payer: Self-pay

## 2012-09-05 NOTE — Telephone Encounter (Signed)
Patient returning Sharon's call and states he takes Lisinopril 10 mg and would like Jasmine December to return his call to let him know he got this message.

## 2012-09-05 NOTE — Telephone Encounter (Signed)
He can have what he has been taking So Lisinopril 10 mg po daily #30 with 5 rf or #90 with 1 rf

## 2012-09-05 NOTE — Telephone Encounter (Signed)
Received fax from pharmacy requesting Lisinopril 5 mg tab: Take one PO QD; EMR shows Rx to pharmacy 07.21.14 #[id number] for Lisinopril 10 mg. OV note states pt placed on Lisinopril 5 mg on 04.22.14 & at 07.21.14 OV note stated "HTN well controlled, no changes in medication" but AVS shows change from 5 mg to 10 mg; phone call to pt for clarification reported he is taking the 10 mg. Would you please advise if this is Ok or if pt is to remain on 5 mg/SLS Thanks so much.

## 2012-09-06 NOTE — Telephone Encounter (Signed)
Spoke with patient's pharmacy to confirm that patient had Rx for Lisinopril 10 mg sent in 07.21.14 & available to patient [and/or confirm change in dosage; Rx confirmed available refills/SLS

## 2012-09-10 ENCOUNTER — Emergency Department (HOSPITAL_BASED_OUTPATIENT_CLINIC_OR_DEPARTMENT_OTHER): Payer: BC Managed Care – PPO

## 2012-09-10 ENCOUNTER — Emergency Department (HOSPITAL_BASED_OUTPATIENT_CLINIC_OR_DEPARTMENT_OTHER)
Admission: EM | Admit: 2012-09-10 | Discharge: 2012-09-10 | Disposition: A | Discharge status: Home / Self Care | Payer: BC Managed Care – PPO | Attending: Emergency Medicine | Admitting: Emergency Medicine

## 2012-09-10 ENCOUNTER — Encounter (HOSPITAL_BASED_OUTPATIENT_CLINIC_OR_DEPARTMENT_OTHER): Payer: Self-pay | Admitting: Emergency Medicine

## 2012-09-10 DIAGNOSIS — I10 Essential (primary) hypertension: Secondary | ICD-10-CM | POA: Insufficient documentation

## 2012-09-10 DIAGNOSIS — Z87442 Personal history of urinary calculi: Secondary | ICD-10-CM | POA: Insufficient documentation

## 2012-09-10 DIAGNOSIS — S8990XA Unspecified injury of unspecified lower leg, initial encounter: Secondary | ICD-10-CM | POA: Insufficient documentation

## 2012-09-10 DIAGNOSIS — M25561 Pain in right knee: Secondary | ICD-10-CM

## 2012-09-10 DIAGNOSIS — Z79899 Other long term (current) drug therapy: Secondary | ICD-10-CM | POA: Insufficient documentation

## 2012-09-10 DIAGNOSIS — F319 Bipolar disorder, unspecified: Secondary | ICD-10-CM | POA: Insufficient documentation

## 2012-09-10 DIAGNOSIS — E119 Type 2 diabetes mellitus without complications: Secondary | ICD-10-CM | POA: Insufficient documentation

## 2012-09-10 DIAGNOSIS — Y9289 Other specified places as the place of occurrence of the external cause: Secondary | ICD-10-CM | POA: Insufficient documentation

## 2012-09-10 DIAGNOSIS — Y9389 Activity, other specified: Secondary | ICD-10-CM | POA: Insufficient documentation

## 2012-09-10 DIAGNOSIS — M25571 Pain in right ankle and joints of right foot: Secondary | ICD-10-CM

## 2012-09-10 DIAGNOSIS — X500XXA Overexertion from strenuous movement or load, initial encounter: Secondary | ICD-10-CM | POA: Insufficient documentation

## 2012-09-10 MED ORDER — CYCLOBENZAPRINE HCL 10 MG PO TABS
10.0000 mg | ORAL_TABLET | Freq: Once | ORAL | Status: AC
  Administered 2012-09-10: 10 mg via ORAL
  Filled 2012-09-10: qty 1

## 2012-09-10 MED ORDER — OXYCODONE-ACETAMINOPHEN 5-325 MG PO TABS: 2.0000 | ORAL_TABLET | ORAL | Status: DC | PRN

## 2012-09-10 MED ORDER — OXYCODONE-ACETAMINOPHEN 5-325 MG PO TABS
2.0000 | ORAL_TABLET | Freq: Once | ORAL | Status: AC
  Administered 2012-09-10: 2 via ORAL
  Filled 2012-09-10 (×2): qty 2

## 2012-09-10 MED ORDER — CYCLOBENZAPRINE HCL 10 MG PO TABS: 10.0000 mg | ORAL_TABLET | Freq: Two times a day (BID) | ORAL | Status: DC | PRN

## 2012-09-10 NOTE — ED Notes (Signed)
Foot pain without injury. Thinks it might be gout even though he has never been diagnosed with it.

## 2012-09-10 NOTE — ED Provider Notes (Signed)
CSN: [id number]     Arrival date & time 09/10/12  1102 History     First MD Initiated Contact with Patient 09/10/12 1245     Chief Complaint  Patient presents with  . Foot Pain  . Knee Pain   (Consider location/radiation/quality/duration/timing/severity/associated sxs/prior Treatment) HPI Comments: Patient is a 32 year old male who presents with a 1 week history of right knee and right ankle pain. Patient denies any injury to the right ankle but does reports "tweaking" his right knee while walking. Patient reports hearing a "pop" sudden onset of throbbing, severe pain that is localized to right knee. The right ankle pain started gradually without injury. The right ankle pain progressively worsened since the onset and is described as throbbing and severe without radiation.  Patient reports progressive worsening of pain. Ankle movement and weight bearing activity make the pain worse. Nothing makes the pain better. Patient reports associated swelling. Patient has not tried anything for pain relief. Patient denies obvious deformity, numbness/tingling, coolness/weakness of extremity, bruising, and any other injury.     Patient is a 32 y.o. male presenting with lower extremity pain and knee pain.  Foot Pain Associated symptoms include arthralgias and joint swelling.  Knee Pain   Past Medical History  Diagnosis Date  . Kidney stones   . Diabetes mellitus   . Bipolar 1 disorder   . HTN (hypertension) 05/10/2012  . Preventative health care 05/14/2012  . Renal lithiasis 08/10/2012  . Bipolar disorder, unspecified 08/10/2012   History reviewed. No pertinent past surgical history. Family History  Problem Relation Age of Onset  . Hypertension Other   . Diabetes Other    History  Substance Use Topics  . Smoking status: Never Smoker   . Smokeless tobacco: Never Used  . Alcohol Use: No    Review of Systems  Musculoskeletal: Positive for joint swelling and arthralgias.  All other systems  reviewed and are negative.    Allergies  Review of patient's allergies indicates no known allergies.  Home Medications   Current Outpatient Rx  Name  Route  Sig  Dispense  Refill  . glimepiride (AMARYL) 4 MG tablet   Oral   Take 1 tablet (4 mg total) by mouth daily before breakfast.   30 tablet   6   . lamoTRIgine (LAMICTAL) 25 MG tablet   Oral   Take 75 mg by mouth daily.          Marland Kitchen lisinopril (PRINIVIL,ZESTRIL) 10 MG tablet   Oral   Take 1 tablet (10 mg total) by mouth daily.   90 tablet   3   . methocarbamol (ROBAXIN) 500 MG tablet   Oral   Take 1 tablet (500 mg total) by mouth 3 (three) times daily as needed.   90 tablet   1   . pravastatin (PRAVACHOL) 10 MG tablet   Oral   Take 1 tablet (10 mg total) by mouth daily.   30 tablet   5   . venlafaxine XR (EFFEXOR-XR) 37.5 MG 24 hr capsule   Oral   Take 75 mg by mouth daily.           BP 130/80  Pulse 74  Temp(Src) 98.5 F (36.9 C) (Oral)  Resp 16  Ht 6' 2.5" (1.892 m)  Wt 380 lb (172.367 kg)  BMI 48.15 kg/m2  SpO2 98% Physical Exam  Nursing note and vitals reviewed. Constitutional: He is oriented to person, place, and time. He appears well-developed and well-nourished. No  distress.  HENT:  Head: Normocephalic and atraumatic.  Eyes: Conjunctivae are normal.  Neck: Normal range of motion.  Cardiovascular: Normal rate and regular rhythm.  Exam reveals no gallop and no friction rub.   No murmur heard. Pulmonary/Chest: Effort normal and breath sounds normal. He has no wheezes. He has no rales. He exhibits no tenderness.  Musculoskeletal: Normal range of motion.  Tenderness to palpation over 1st metatarsal bone of right foot extending down into arch of foot. No obvious deformity or wound. Full ROM of great toe.   Right knee lateral tenderness to palpation. ROM slightly limited due to pain. Mild edema noted. No obvious deformity, balloon sign or wound noted.     Neurological: He is alert and oriented  to person, place, and time. Coordination normal.  Speech is goal-oriented. Moves limbs without ataxia.   Skin: Skin is warm and dry.  Psychiatric: He has a normal mood and affect.    ED Course   Procedures (including critical care time)  Labs Reviewed - No data to display Dg Knee 1-2 Views Right  09/10/2012   CLINICAL DATA:  Right knee pain  EXAM: RIGHT KNEE - 1-2 VIEW  COMPARISON:  MRI 02/05/2011  FINDINGS: Patellofemoral spurring. Moderate knee effusion. Mild spurring of the tibial spine. No fracture or acute bony findings.  IMPRESSION: 1. Moderate knee effusion.  2. Osteoarthritis, with patellofemoral spurring noted.   Electronically Signed   By: Herbie Baltimore   On: 09/10/2012 12:02   Dg Foot Complete Right  09/10/2012   CLINICAL DATA:  Anterior right foot pain.  EXAM: RIGHT FOOT COMPLETE - 3+ VIEW  COMPARISON:  01/18/2010  FINDINGS: No fracture or acute bony findings. Alignment at the Lisfranc joint is normal. Achilles calcaneal spur noted. Suspected sessile posterior osteochondroma of the distal tibia metaphysis.  IMPRESSION: 1. No acute findings. 2. 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   Electronically Signed   By: Herbie Baltimore   On: 09/10/2012 12:04   1. Knee joint pain, right   2. Right ankle pain     MDM  1:11 PM Patient's xrays unremarkable for acute changes. 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 Patient declined ankle brace for comfort. Patient has right knee brace to wear. No neurovascular compromise. Patient will have Percocet and Flexeril here and instructions to follow up with Orthopedics for further evaluation.   Emilia Beck, PA-C 09/10/12 1324

## 2012-09-14 NOTE — ED Provider Notes (Signed)
Medical screening examination/treatment/procedure(s) were performed by non-physician practitioner and as supervising physician I was immediately available for consultation/collaboration.   Candyce Churn, MD 09/14/12 1009

## 2012-09-20 ENCOUNTER — Ambulatory Visit: Payer: BC Managed Care – PPO | Admitting: Physician Assistant

## 2012-09-21 ENCOUNTER — Encounter: Payer: Self-pay | Admitting: Physician Assistant

## 2012-09-21 ENCOUNTER — Ambulatory Visit (INDEPENDENT_AMBULATORY_CARE_PROVIDER_SITE_OTHER): Payer: BC Managed Care – PPO | Admitting: Physician Assistant

## 2012-09-21 VITALS — BP 108/84 | HR 88 | Temp 98.1°F | Resp 18 | Wt 375.2 lb

## 2012-09-21 DIAGNOSIS — M25561 Pain in right knee: Secondary | ICD-10-CM

## 2012-09-21 DIAGNOSIS — D1630 Benign neoplasm of short bones of unspecified lower limb: Secondary | ICD-10-CM | POA: Insufficient documentation

## 2012-09-21 DIAGNOSIS — D169 Benign neoplasm of bone and articular cartilage, unspecified: Secondary | ICD-10-CM

## 2012-09-21 DIAGNOSIS — M25569 Pain in unspecified knee: Secondary | ICD-10-CM

## 2012-09-21 DIAGNOSIS — M79609 Pain in unspecified limb: Secondary | ICD-10-CM

## 2012-09-21 DIAGNOSIS — M79674 Pain in right toe(s): Secondary | ICD-10-CM

## 2012-09-21 HISTORY — DX: Pain in right knee: M25.561

## 2012-09-21 HISTORY — DX: Benign neoplasm of bone and articular cartilage, unspecified: D16.9

## 2012-09-21 LAB — BASIC METABOLIC PANEL
CO2: 24 mEq/L (ref 19–32)
Calcium: 9 mg/dL (ref 8.4–10.5)
Sodium: 138 mEq/L (ref 135–145)

## 2012-09-21 MED ORDER — CYCLOBENZAPRINE HCL 10 MG PO TABS: 10.0000 mg | ORAL_TABLET | Freq: Two times a day (BID) | ORAL | Status: DC | PRN

## 2012-09-21 MED ORDER — NAPROXEN SODIUM 550 MG PO TABS: 550.0000 mg | ORAL_TABLET | Freq: Two times a day (BID) | ORAL | Status: DC

## 2012-09-21 NOTE — Assessment & Plan Note (Addendum)
Order placed for MRI R knee for further evaluation for possible meniscal or ligamentous injury.  RX for anaprox.  Rx refill of flexeril for muscle spasm.  RICE therapy.  Orthopedic referral made.

## 2012-09-21 NOTE — Assessment & Plan Note (Deleted)
Possible chronic osteochondroma found incidentally on xray of right foot in ED.  Orthopedics referral made for evaluation

## 2012-09-21 NOTE — Assessment & Plan Note (Addendum)
Possible resolving gout flare.  No erythema or swelling on examination, but slight tenderness to palpation.  Rx for anaprox for knee inflammation should help this.  Ice to affected area ever 4-6 hours.  Return if symptoms persist.

## 2012-09-21 NOTE — Assessment & Plan Note (Signed)
Possible chronic osteochondroma found incidentally on xray of right foot in ED.  Orthopedics referral made for evaluation 

## 2012-09-21 NOTE — Progress Notes (Signed)
Patient ID: Colin Bennett, male   DOB: 04-14-1980, 32 y.o.   MRN: [id number]  Patient presents to clinic today for ED follow-up for R knee and R foot pain..  Information was obtained from the patient.    Patient states that was seen in the ED on 09/10/12 for R knee pain following an injury while wrestling with as family member.  Patient endorses twisting his knee and falling on it wrong, with an associated popping sound and immediate intense pain.  Patient states he was not able to bear weight on the affected joint. X ray of Knee was obtained showing no acute abnormality, but some spurs and arthritic changes.  Patient does endorse history of patellar fracture several years ago.  Denies other history of trauma to R knee.  Patient states he was given prescription for flexeril and percocet and discharged.  He states the physician did not examine his knee.  He reports continued pain with weight bearing and the sensation of his knee buckling.  Has worn an old knee brace to help stabilize his knee.  Patient also had been having some pain in the big toe of his right foot, associated with redness and swelling.  Patient denies history of gout or trauma to his big toe.  He noted some decrease in motion of big toe 2/2 to swelling and pain.  Patient denies fever, pain, chills, night sweats, unexplained change in weight or excessive fatigue.  XRay was obtained showing evidence of a possible sessile chronic osteochondroma.  Patient was told to folllow-up with PCP and orthopedist, but states they did not actually give him a referral.  Past Medical History  Diagnosis Date  . Kidney stones   . Diabetes mellitus   . Bipolar 1 disorder   . HTN (hypertension) 05/10/2012  . Preventative health care 05/14/2012  . Renal lithiasis 08/10/2012  . Bipolar disorder, unspecified 08/10/2012    Current Outpatient Prescriptions on File Prior to Visit  Medication Sig Dispense Refill  . glimepiride (AMARYL) 4 MG tablet Take 1 tablet (4  mg total) by mouth daily before breakfast.  30 tablet  6  . lisinopril (PRINIVIL,ZESTRIL) 10 MG tablet Take 1 tablet (10 mg total) by mouth daily.  90 tablet  3  . methocarbamol (ROBAXIN) 500 MG tablet Take 1 tablet (500 mg total) by mouth 3 (three) times daily as needed.  90 tablet  1  . oxyCODONE-acetaminophen (PERCOCET/ROXICET) 5-325 MG per tablet Take 2 tablets by mouth every 4 (four) hours as needed for pain.  12 tablet  0  . pravastatin (PRAVACHOL) 10 MG tablet Take 1 tablet (10 mg total) by mouth daily.  30 tablet  5  . lamoTRIgine (LAMICTAL) 25 MG tablet Take 75 mg by mouth daily.       Marland Kitchen venlafaxine XR (EFFEXOR-XR) 37.5 MG 24 hr capsule Take 75 mg by mouth daily.        No current facility-administered medications on file prior to visit.    No Known Allergies  Family History  Problem Relation Age of Onset  . Hypertension Other   . Diabetes Other     History   Social History  . Marital Status: Married    Spouse Name: N/A    Number of Children: 0  . Years of Education: N/A   Occupational History  . Collateral recovery    Social History Main Topics  . Smoking status: Never Smoker   . Smokeless tobacco: Never Used  . Alcohol Use: No  .  Drug Use: No  . Sexual Activity: None   Other Topics Concern  . None   Social History Narrative  . None   Review of Systems  Constitutional: Negative for fever, chills, weight loss, malaise/fatigue and diaphoresis.  Musculoskeletal: Positive for joint pain and falls.  Neurological: Negative for dizziness, tingling, sensory change, speech change and loss of consciousness.  Endo/Heme/Allergies: Does not bruise/bleed easily.   Filed Vitals:   09/21/12 0912  BP: 108/84  Pulse: 88  Temp: 98.1 F (36.7 C)  Resp: 18   Physical Exam  Vitals reviewed. Constitutional: He is oriented to person, place, and time and well-developed, well-nourished, and in no distress.  HENT:  Head: Normocephalic and atraumatic.  Eyes: Conjunctivae  are normal. Pupils are equal, round, and reactive to light.  Cardiovascular: Normal rate, regular rhythm, normal heart sounds and intact distal pulses.   Pulmonary/Chest: Effort normal and breath sounds normal.  Musculoskeletal:  Pain in medial joint line of R knee.  No effusion present on examination today.  Active range of motion shows adequate extension of R knee but limited flexion secondary to pain.  Anterior drawer induces pain but no evidence of laxity.  Apley test could not be adequately performed due to restriction in ROM.  No pain with varus/valgus stress noted.   Some pain in 1st MTP joint of R foot without erythema, swelling or gross bony deformity.  Neurological: He is alert and oriented to person, place, and time. He has normal reflexes. No cranial nerve deficit.  Skin: Skin is warm and dry. No rash noted.   Recent Results (from the past 2160 hour(s))  HEPATIC FUNCTION PANEL     Status: None   Collection Time    08/05/12  1:37 PM      Result Value Range   Total Bilirubin 0.4  0.3 - 1.2 mg/dL   Bilirubin, Direct 0.1  0.0 - 0.3 mg/dL   Indirect Bilirubin 0.3  0.0 - 0.9 mg/dL   Alkaline Phosphatase 69  39 - 117 U/L   AST 17  0 - 37 U/L   ALT 28  0 - 53 U/L   Total Protein 6.6  6.0 - 8.3 g/dL   Albumin 4.0  3.5 - 5.2 g/dL  LIPID PANEL     Status: Abnormal   Collection Time    08/05/12  1:37 PM      Result Value Range   Cholesterol 184  0 - 200 mg/dL   Comment: ATP III Classification:           < 200        mg/dL        Desirable          200 - 239     mg/dL        Borderline High          >= 240        mg/dL        High         Triglycerides 172 (*) <150 mg/dL   HDL 34 (*) >16 mg/dL   Total CHOL/HDL Ratio 5.4     VLDL 34  0 - 40 mg/dL   LDL Cholesterol 109 (*) 0 - 99 mg/dL   Comment:       Total Cholesterol/HDL Ratio:CHD Risk                            Coronary Heart Disease Risk  Table                                            Men       Women              1/2  Average Risk              3.4        3.3                  Average Risk              5.0        4.4               2X Average Risk              9.6        7.1               3X Average Risk             23.4       11.0     Use the calculated Patient Ratio above and the CHD Risk table      to determine the patient's CHD Risk.     ATP III Classification (LDL):           < 100        mg/dL         Optimal          100 - 129     mg/dL         Near or Above Optimal          130 - 159     mg/dL         Borderline High          160 - 189     mg/dL         High           > 190        mg/dL         Very High        RENAL FUNCTION PANEL     Status: Abnormal   Collection Time    08/05/12  1:37 PM      Result Value Range   Sodium 137  135 - 145 mEq/L   Potassium 4.0  3.5 - 5.3 mEq/L   Chloride 105  96 - 112 mEq/L   CO2 25  19 - 32 mEq/L   Glucose, Bld 114 (*) 70 - 99 mg/dL   BUN 11  6 - 23 mg/dL   Creat 1.61  0.96 - 0.45 mg/dL   Albumin 4.0  3.5 - 5.2 g/dL   Calcium 9.5  8.4 - 40.9 mg/dL   Phosphorus 2.4  2.3 - 4.6 mg/dL  CBC     Status: None   Collection Time    08/05/12  1:37 PM      Result Value Range   WBC 9.0  4.0 - 10.5 K/uL   RBC 5.21  4.22 - 5.81 MIL/uL   Hemoglobin 15.4  13.0 - 17.0 g/dL   HCT 81.1  91.4 - 78.2 %   MCV 84.6  78.0 - 100.0 fL   MCH 29.6  26.0 - 34.0 pg   MCHC 34.9  30.0 - 36.0 g/dL  RDW 14.1  11.5 - 15.5 %   Platelets 257  150 - 400 K/uL  HEMOGLOBIN A1C     Status: Abnormal   Collection Time    08/05/12  1:37 PM      Result Value Range   Hemoglobin A1C 6.5 (*) <5.7 %   Comment:                                                                            According to the ADA Clinical Practice Recommendations for 2011, when     HbA1c is used as a screening test:             >=6.5%   Diagnostic of Diabetes Mellitus                (if abnormal result is confirmed)           5.7-6.4%   Increased risk of developing Diabetes Mellitus            References:Diagnosis and Classification of Diabetes Mellitus,Diabetes     Care,2011,34(Suppl 1):S62-S69 and Standards of Medical Care in             Diabetes - 2011,Diabetes Care,2011,34 (Suppl 1):S11-S61.         Mean Plasma Glucose 140 (*) <117 mg/dL  TSH     Status: None   Collection Time    08/05/12  1:37 PM      Result Value Range   TSH 0.848  0.350 - 4.500 uIU/mL  Diagnostics:  RIGHT FOOT COMPLETE - 3+ VIEW  COMPARISON: 01/18/2010  FINDINGS:  No fracture or acute bony findings. Alignment at the Lisfranc joint  is normal. Achilles calcaneal spur noted. Suspected sessile  posterior osteochondroma of the distal tibia metaphysis.  IMPRESSION:  1. No acute findings.  2. Suspected sessile chronic osteochondroma along the posterior  margin of the distal tibial metaphysis.  EXAM:  RIGHT KNEE - 1-2 VIEW  COMPARISON: MRI 02/05/2011  FINDINGS:  Patellofemoral spurring. Moderate knee effusion. Mild spurring of  the tibial spine. No fracture or acute bony findings.  IMPRESSION:  1. Moderate knee effusion.  2. Osteoarthritis, with patellofemoral spurring noted.   Assessment/Plan: Right knee pain Order placed for MRI R knee for further evaluation for possible meniscal or ligamentous injury.  RX for anaprox.  Rx refill of flexeril for muscle spasm.  RICE therapy.  Orthopedic referral made.  Pain in toe of right foot Possible resolving gout flare.  No erythema or swelling on examination, but slight tenderness to palpation.  Rx for anaprox for knee inflammation should help this.  Ice to affected area ever 4-6 hours.  Return if symptoms persist.    Osteochondroma of foot Possible chronic osteochondroma found incidentally on xray of right foot in ED.  Orthopedics referral made for evaluation

## 2012-09-21 NOTE — Patient Instructions (Signed)
Please get labs today so we can schedule an MRI.  You will be contacted with date/time of MRI.  Please take Anaprox as prescribed for relief of pain.  Apply ice to foot and knee for 15 minutes every 4-6 hours.  Rest your leg.  Keep leg elevated at home.  I have made a referral for you to orthopedics.  Knee Pain The knee is the complex joint between your thigh and your lower leg. It is made up of bones, tendons, ligaments, and cartilage. The bones that make up the knee are:  The femur in the thigh.  The tibia and fibula in the lower leg.  The patella or kneecap riding in the groove on the lower femur. CAUSES  Knee pain is a common complaint with many causes. A few of these causes are:  Injury, such as:  A ruptured ligament or tendon injury.  Torn cartilage.  Medical conditions, such as:  Gout  Arthritis  Infections  Overuse, over training or overdoing a physical activity. Knee pain can be minor or severe. Knee pain can accompany debilitating injury. Minor knee problems often respond well to self-care measures or get well on their own. More serious injuries may need medical intervention or even surgery. SYMPTOMS The knee is complex. Symptoms of knee problems can vary widely. Some of the problems are:  Pain with movement and weight bearing.  Swelling and tenderness.  Buckling of the knee.  Inability to straighten or extend your knee.  Your knee locks and you cannot straighten it.  Warmth and redness with pain and fever.  Deformity or dislocation of the kneecap. DIAGNOSIS  Determining what is wrong may be very straight forward such as when there is an injury. It can also be challenging because of the complexity of the knee. Tests to make a diagnosis may include:  Your caregiver taking a history and doing a physical exam.  Routine X-rays can be used to rule out other problems. X-rays will not reveal a cartilage tear. Some injuries of the knee can be diagnosed  by:  Arthroscopy a surgical technique by which a small video camera is inserted through tiny incisions on the sides of the knee. This procedure is used to examine and repair internal knee joint problems. Tiny instruments can be used during arthroscopy to repair the torn knee cartilage (meniscus).  Arthrography is a radiology technique. A contrast liquid is directly injected into the knee joint. Internal structures of the knee joint then become visible on X-ray film.  An MRI scan is a non x-ray radiology procedure in which magnetic fields and a computer produce two- or three-dimensional images of the inside of the knee. Cartilage tears are often visible using an MRI scanner. MRI scans have largely replaced arthrography in diagnosing cartilage tears of the knee.  Blood work.  Examination of the fluid that helps to lubricate the knee joint (synovial fluid). This is done by taking a sample out using a needle and a syringe. TREATMENT The treatment of knee problems depends on the cause. Some of these treatments are:  Depending on the injury, proper casting, splinting, surgery or physical therapy care will be needed.  Give yourself adequate recovery time. Do not overuse your joints. If you begin to get sore during workout routines, back off. Slow down or do fewer repetitions.  For repetitive activities such as cycling or running, maintain your strength and nutrition.  Alternate muscle groups. For example if you are a weight lifter, work the upper body  on one day and the lower body the next.  Either tight or weak muscles do not give the proper support for your knee. Tight or weak muscles do not absorb the stress placed on the knee joint. Keep the muscles surrounding the knee strong.  Take care of mechanical problems.  If you have flat feet, orthotics or special shoes may help. See your caregiver if you need help.  Arch supports, sometimes with wedges on the inner or outer aspect of the heel, can  help. These can shift pressure away from the side of the knee most bothered by osteoarthritis.  A brace called an "unloader" brace also may be used to help ease the pressure on the most arthritic side of the knee.  If your caregiver has prescribed crutches, braces, wraps or ice, use as directed. The acronym for this is PRICE. This means protection, rest, ice, compression and elevation.  Nonsteroidal anti-inflammatory drugs (NSAID's), can help relieve pain. But if taken immediately after an injury, they may actually increase swelling. Take NSAID's with food in your stomach. Stop them if you develop stomach problems. Do not take these if you have a history of ulcers, stomach pain or bleeding from the bowel. Do not take without your caregiver's approval if you have problems with fluid retention, heart failure, or kidney problems.  For ongoing knee problems, physical therapy may be helpful.  Glucosamine and chondroitin are over-the-counter dietary supplements. Both may help relieve the pain of osteoarthritis in the knee. These medicines are different from the usual anti-inflammatory drugs. Glucosamine may decrease the rate of cartilage destruction.  Injections of a corticosteroid drug into your knee joint may help reduce the symptoms of an arthritis flare-up. They may provide pain relief that lasts a few months. You may have to wait a few months between injections. The injections do have a small increased risk of infection, water retention and elevated blood sugar levels.  Hyaluronic acid injected into damaged joints may ease pain and provide lubrication. These injections may work by reducing inflammation. A series of shots may give relief for as long as 6 months.  Topical painkillers. Applying certain ointments to your skin may help relieve the pain and stiffness of osteoarthritis. Ask your pharmacist for suggestions. Many over the-counter products are approved for temporary relief of arthritis  pain.  In some countries, doctors often prescribe topical NSAID's for relief of chronic conditions such as arthritis and tendinitis. A review of treatment with NSAID creams found that they worked as well as oral medications but without the serious side effects. PREVENTION  Maintain a healthy weight. Extra pounds put more strain on your joints.  Get strong, stay limber. Weak muscles are a common cause of knee injuries. Stretching is important. Include flexibility exercises in your workouts.  Be smart about exercise. If you have osteoarthritis, chronic knee pain or recurring injuries, you may need to change the way you exercise. This does not mean you have to stop being active. If your knees ache after jogging or playing basketball, consider switching to swimming, water aerobics or other low-impact activities, at least for a few days a week. Sometimes limiting high-impact activities will provide relief.  Make sure your shoes fit well. Choose footwear that is right for your sport.  Protect your knees. Use the proper gear for knee-sensitive activities. Use kneepads when playing volleyball or laying carpet. Buckle your seat belt every time you drive. Most shattered kneecaps occur in car accidents.  Rest when you are tired. SEEK  MEDICAL CARE IF:  You have knee pain that is continual and does not seem to be getting better.  SEEK IMMEDIATE MEDICAL CARE IF:  Your knee joint feels hot to the touch and you have a high fever. MAKE SURE YOU:   Understand these instructions.  Will watch your condition.  Will get help right away if you are not doing well or get worse. Document Released: 11/02/2006 Document Revised: 03/30/2011 Document Reviewed: 11/02/2006 [facility] Patient Information 2014 [place], [place].

## 2012-09-24 ENCOUNTER — Ambulatory Visit (HOSPITAL_BASED_OUTPATIENT_CLINIC_OR_DEPARTMENT_OTHER)
Admission: RE | Admit: 2012-09-24 | Discharge: 2012-09-24 | Disposition: A | Discharge status: Home / Self Care | Payer: BC Managed Care – PPO | Source: Ambulatory Visit | Attending: Physician Assistant | Admitting: Physician Assistant

## 2012-09-24 DIAGNOSIS — M25469 Effusion, unspecified knee: Secondary | ICD-10-CM | POA: Insufficient documentation

## 2012-09-24 DIAGNOSIS — M25561 Pain in right knee: Secondary | ICD-10-CM

## 2012-09-24 DIAGNOSIS — M238X9 Other internal derangements of unspecified knee: Secondary | ICD-10-CM | POA: Insufficient documentation

## 2012-09-24 DIAGNOSIS — S83509A Sprain of unspecified cruciate ligament of unspecified knee, initial encounter: Secondary | ICD-10-CM | POA: Insufficient documentation

## 2012-09-24 DIAGNOSIS — S83419A Sprain of medial collateral ligament of unspecified knee, initial encounter: Secondary | ICD-10-CM | POA: Insufficient documentation

## 2012-09-24 DIAGNOSIS — X500XXA Overexertion from strenuous movement or load, initial encounter: Secondary | ICD-10-CM | POA: Insufficient documentation

## 2012-09-24 MED ORDER — GADOBENATE DIMEGLUMINE 529 MG/ML IV SOLN: 20.0000 mL | Freq: Once | INTRAVENOUS | Status: AC | PRN

## 2012-09-25 ENCOUNTER — Telehealth: Payer: Self-pay | Admitting: Physician Assistant

## 2012-09-25 DIAGNOSIS — S83511A Sprain of anterior cruciate ligament of right knee, initial encounter: Secondary | ICD-10-CM

## 2012-09-25 DIAGNOSIS — S83519A Sprain of anterior cruciate ligament of unspecified knee, initial encounter: Secondary | ICD-10-CM

## 2012-09-25 NOTE — Telephone Encounter (Signed)
Please inform patient that MRI shows he has a torn ACL (anterior cruciate ligament) and strained MCL (medial collateral ligament).  He is to continue care as mentioned at visit, reducing use of his leg/knee.  I have set him up for an urgent referral to orthopedics.

## 2012-09-26 NOTE — Telephone Encounter (Signed)
LMOM with contact name and number [for return call, if needed] RE: Urgent Ortho referral and further provider instructions/SLS

## 2012-11-04 ENCOUNTER — Telehealth: Payer: Self-pay | Admitting: *Deleted

## 2012-11-04 DIAGNOSIS — E785 Hyperlipidemia, unspecified: Secondary | ICD-10-CM

## 2012-11-04 DIAGNOSIS — E119 Type 2 diabetes mellitus without complications: Secondary | ICD-10-CM

## 2012-11-04 DIAGNOSIS — I10 Essential (primary) hypertension: Secondary | ICD-10-CM

## 2012-11-04 LAB — CBC
Hemoglobin: 15.2 g/dL (ref 13.0–17.0)
MCH: 30.6 pg (ref 26.0–34.0)
MCHC: 34.7 g/dL (ref 30.0–36.0)
Platelets: 239 10*3/uL (ref 150–400)
RDW: 13.6 % (ref 11.5–15.5)

## 2012-11-04 LAB — RENAL FUNCTION PANEL
Albumin: 3.6 g/dL (ref 3.5–5.2)
Calcium: 8.8 mg/dL (ref 8.4–10.5)
Phosphorus: 2.7 mg/dL (ref 2.3–4.6)
Potassium: 4.2 mEq/L (ref 3.5–5.3)
Sodium: 139 mEq/L (ref 135–145)

## 2012-11-04 LAB — LIPID PANEL
HDL: 34 mg/dL — ABNORMAL LOW (ref >39)
LDL Cholesterol: 78 mg/dL (ref 0–99)
Total CHOL/HDL Ratio: 4 Ratio
Triglycerides: 115 mg/dL (ref <150)
VLDL: 23 mg/dL (ref 0–40)

## 2012-11-04 LAB — HEPATIC FUNCTION PANEL
Alkaline Phosphatase: 60 U/L (ref 39–117)
Bilirubin, Direct: 0.1 mg/dL (ref 0.0–0.3)
Indirect Bilirubin: 0.4 mg/dL (ref 0.0–0.9)
Total Protein: 6.2 g/dL (ref 6.0–8.3)

## 2012-11-04 LAB — HEMOGLOBIN A1C
Hgb A1c MFr Bld: 7 % — ABNORMAL HIGH (ref <5.7)
Mean Plasma Glucose: 154 mg/dL — ABNORMAL HIGH (ref <117)

## 2012-11-04 LAB — TSH: TSH: 0.922 u[IU]/mL (ref 0.350–4.500)

## 2012-11-04 NOTE — Telephone Encounter (Signed)
Pt presented to the lab. Orders entered per 07/2012 office note as below:  Labs prior lipid, renal, cbc, tsh, hepatic, hgba1c

## 2012-11-07 ENCOUNTER — Encounter: Payer: Self-pay | Admitting: Family Medicine

## 2012-11-07 ENCOUNTER — Ambulatory Visit (INDEPENDENT_AMBULATORY_CARE_PROVIDER_SITE_OTHER): Payer: BC Managed Care – PPO | Admitting: Family Medicine

## 2012-11-07 ENCOUNTER — Other Ambulatory Visit: Payer: Self-pay | Admitting: Physician Assistant

## 2012-11-07 VITALS — BP 120/84 | HR 81 | Temp 97.6°F | Ht 75.0 in | Wt 378.0 lb

## 2012-11-07 DIAGNOSIS — M25561 Pain in right knee: Secondary | ICD-10-CM

## 2012-11-07 DIAGNOSIS — E669 Obesity, unspecified: Secondary | ICD-10-CM

## 2012-11-07 DIAGNOSIS — Z0000 Encounter for general adult medical examination without abnormal findings: Secondary | ICD-10-CM

## 2012-11-07 DIAGNOSIS — I10 Essential (primary) hypertension: Secondary | ICD-10-CM

## 2012-11-07 DIAGNOSIS — Z23 Encounter for immunization: Secondary | ICD-10-CM

## 2012-11-07 DIAGNOSIS — G4733 Obstructive sleep apnea (adult) (pediatric): Secondary | ICD-10-CM

## 2012-11-07 DIAGNOSIS — E785 Hyperlipidemia, unspecified: Secondary | ICD-10-CM

## 2012-11-07 DIAGNOSIS — M25569 Pain in unspecified knee: Secondary | ICD-10-CM

## 2012-11-07 DIAGNOSIS — E119 Type 2 diabetes mellitus without complications: Secondary | ICD-10-CM

## 2012-11-07 MED ORDER — METFORMIN HCL 500 MG PO TABS: 500.0000 mg | ORAL_TABLET | Freq: Every day | ORAL | Status: DC

## 2012-11-07 MED ORDER — CYCLOBENZAPRINE HCL 10 MG PO TABS: 10.0000 mg | ORAL_TABLET | Freq: Every evening | ORAL | Status: DC | PRN

## 2012-11-07 NOTE — Patient Instructions (Addendum)
Krill oil cap daily such as [employer]ist Pas patches  DASH Diet The DASH diet stands for "Dietary Approaches to Stop Hypertension." It is a healthy eating plan that has been shown to reduce high blood pressure (hypertension) in as little as 14 days, while also possibly providing other significant health benefits. These other health benefits include reducing the risk of breast cancer after menopause and reducing the risk of type 2 diabetes, heart disease, colon cancer, and stroke. Health benefits also include weight loss and slowing kidney failure in patients with chronic kidney disease.  DIET GUIDELINES  Limit salt (sodium). Your diet should contain less than 1500 mg of sodium daily.  Limit refined or processed carbohydrates. Your diet should include mostly whole grains. Desserts and added sugars should be used sparingly.  Include small amounts of heart-healthy fats. These types of fats include nuts, oils, and tub margarine. Limit saturated and trans fats. These fats have been shown to be harmful in the body. CHOOSING FOODS  The following food groups are based on a 2000 calorie diet. See your Registered Dietitian for individual calorie needs. Grains and Grain Products (6 to 8 servings daily)  Eat More Often: Whole-wheat bread, brown rice, whole-grain or wheat pasta, quinoa, popcorn without added fat or salt (air popped).  Eat Less Often: White bread, white pasta, white rice, cornbread. Vegetables (4 to 5 servings daily)  Eat More Often: Fresh, frozen, and canned vegetables. Vegetables may be raw, steamed, roasted, or grilled with a minimal amount of fat.  Eat Less Often/Avoid: Creamed or fried vegetables. Vegetables in a cheese sauce. Fruit (4 to 5 servings daily)  Eat More Often: All fresh, canned (in natural juice), or frozen fruits. Dried fruits without added sugar. One hundred percent fruit juice ( cup [237 mL] daily).  Eat Less Often: Dried fruits with added sugar. Canned fruit in  light or heavy syrup. [employer], Fish, and Poultry (2 servings or less daily. One serving is 3 to 4 oz [85-114 g]).  Eat More Often: Ninety percent or leaner ground beef, tenderloin, sirloin. Round cuts of beef, chicken breast, [place] breast. All fish. Grill, bake, or broil your meat. Nothing should be fried.  Eat Less Often/Avoid: Fatty cuts of meat, [place], or chicken leg, thigh, or wing. Fried cuts of meat or fish. Dairy (2 to 3 servings)  Eat More Often: Low-fat or fat-free milk, low-fat plain or light yogurt, reduced-fat or part-skim cheese.  Eat Less Often/Avoid: Milk (whole, 2%).Whole milk yogurt. Full-fat cheeses. Nuts, Seeds, and Legumes (4 to 5 servings per week)  Eat More Often: All without added salt.  Eat Less Often/Avoid: Salted nuts and seeds, canned beans with added salt. Fats and Sweets (limited)  Eat More Often: Vegetable oils, tub margarines without trans fats, sugar-free gelatin. Mayonnaise and salad dressings.  Eat Less Often/Avoid: Coconut oils, palm oils, butter, stick margarine, cream, half and half, cookies, candy, pie. FOR MORE INFORMATION The Dash Diet Eating Plan: www.dashdiet.org Document Released: 12/25/2010 Document Revised: 03/30/2011 Document Reviewed: 12/25/2010 [facility] Patient Information 2014 [place], [place].

## 2012-11-08 ENCOUNTER — Ambulatory Visit: Payer: BC Managed Care – PPO | Admitting: Family Medicine

## 2012-11-09 ENCOUNTER — Encounter: Payer: Self-pay | Admitting: Family Medicine

## 2012-11-09 DIAGNOSIS — E669 Obesity, unspecified: Secondary | ICD-10-CM

## 2012-11-09 HISTORY — DX: Obesity, unspecified: E66.9

## 2012-11-09 NOTE — Progress Notes (Signed)
Patient ID: Colin Bennett, male   DOB: [birth year], 32 y.o.   MRN: [id number] Colin Bennett [id number] Nov 14, 1980 11/09/2012      Progress Note-Follow Up  Subjective  Chief Complaint  Chief Complaint  Patient presents with  . Follow-up    3 month  . Injections    flu    HPI  Patient is a 32 year old Caucasian male in today for followup. 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 Denies any recent illness. Has been taking meds as prescribed. Has had an occasional nosebleed but no significant congestion. Is using his machine routinely  Past Medical History  Diagnosis Date  . Kidney stones   . Diabetes mellitus   . Bipolar 1 disorder   . HTN (hypertension) 05/10/2012  . Preventative health care 05/14/2012  . Renal lithiasis 08/10/2012  . Bipolar disorder, unspecified 08/10/2012  . Obesity, unspecified 11/09/2012    History reviewed. No pertinent past surgical history.  Family History  Problem Relation Age of Onset  . Hypertension Other   . Diabetes Other     History   Social History  . Marital Status: Married    Spouse Name: N/A    Number of Children: 0  . Years of Education: N/A   Occupational History  . Collateral recovery    Social History Main Topics  . Smoking status: Never Smoker   . Smokeless tobacco: Never Used  . Alcohol Use: No  . Drug Use: No  . Sexual Activity: Not on file   Other Topics Concern  . Not on file   Social History Narrative  . No narrative on file    Current Outpatient Prescriptions on File Prior to Visit  Medication Sig Dispense Refill  . glimepiride (AMARYL) 4 MG tablet Take 1 tablet (4 mg total) by mouth daily before breakfast.  30 tablet  6  . lamoTRIgine (LAMICTAL) 25 MG tablet Take 75 mg by mouth daily.       Marland Kitchen lisinopril (PRINIVIL,ZESTRIL) 10 MG tablet Take 1 tablet (10 mg total) by mouth daily.  90 tablet  3  .  methocarbamol (ROBAXIN) 500 MG tablet Take 1 tablet (500 mg total) by mouth 3 (three) times daily as needed.  90 tablet  1  . naproxen sodium (ANAPROX) 550 MG tablet Take 1 tablet (550 mg total) by mouth 2 (two) times daily with a meal.  60 tablet  0  . oxyCODONE-acetaminophen (PERCOCET/ROXICET) 5-325 MG per tablet Take 2 tablets by mouth every 4 (four) hours as needed for pain.  12 tablet  0  . pravastatin (PRAVACHOL) 10 MG tablet Take 1 tablet (10 mg total) by mouth daily.  30 tablet  5  . venlafaxine XR (EFFEXOR-XR) 37.5 MG 24 hr capsule Take 75 mg by mouth daily.        No current facility-administered medications on file prior to visit.    No Known Allergies  Review of Systems  Review of Systems  Constitutional: Negative for fever and malaise/fatigue.  HENT: Negative for congestion.   Eyes: Negative for discharge.  Respiratory: Negative for shortness of breath.   Cardiovascular: Negative for chest pain, palpitations and leg swelling.  Gastrointestinal: Negative for nausea, abdominal pain and diarrhea.  Genitourinary: Negative for dysuria.  Musculoskeletal: Negative for falls.  Skin: Negative for rash.  Neurological: Negative for loss of consciousness and headaches.  Endo/Heme/Allergies: Negative for polydipsia.  Psychiatric/Behavioral: Negative for depression and suicidal ideas. The patient is not nervous/anxious and does not have insomnia.     Objective  BP 120/84  Pulse 81  Temp(Src) 97.6 F (36.4 C) (Oral)  Ht 6\' 3"  (1.905 m)  Wt 378 lb 0.6 oz (171.478 kg)  BMI 47.25 kg/m2  SpO2 96%  Physical Exam  Physical Exam  Constitutional: He is oriented to person, place, and time and well-developed, well-nourished, and in no distress. No distress.  HENT:  Head: Normocephalic and atraumatic.  Eyes: Conjunctivae are normal.  Neck: Neck supple. No thyromegaly present.  Cardiovascular: Normal rate, regular rhythm and normal heart sounds.   No murmur heard. Pulmonary/Chest:  Effort normal and breath sounds normal. No respiratory distress.  Abdominal: He exhibits no distension and no mass. There is no tenderness.  Musculoskeletal: He exhibits no edema.  Neurological: He is alert and oriented to person, place, and time.  Skin: Skin is warm.  Psychiatric: Memory, affect and judgment normal.    Lab Results  Component Value Date   TSH 0.922 11/04/2012   Lab Results  Component Value Date   WBC 7.8 11/04/2012   HGB 15.2 11/04/2012   HCT 43.8 11/04/2012   MCV 88.1 11/04/2012   PLT 239 11/04/2012   Lab Results  Component Value Date   CREATININE 0.78 11/04/2012   BUN 14 11/04/2012   NA 139 11/04/2012   K 4.2 11/04/2012   CL 106 11/04/2012   CO2 22 11/04/2012   Lab Results  Component Value Date   ALT 27 11/04/2012   AST 17 11/04/2012   ALKPHOS 60 11/04/2012   BILITOT 0.5 11/04/2012   Lab Results  Component Value Date   CHOL 135 11/04/2012   Lab Results  Component Value Date   HDL 34* 11/04/2012   Lab Results  Component Value Date   LDLCALC 78 11/04/2012   Lab Results  Component Value Date   TRIG 115 11/04/2012   Lab Results  Component Value Date   CHOLHDL 4.0 11/04/2012     Assessment & Plan  DM (diabetes mellitus) hgba1c up some encouraged to avoid simple carbs and increase exercise, increase Amaryl to 4 mg daily and continue Metformin  HTN (hypertension) Well controlled, no changes  Hyperlipidemia Tolerating Pravastatin. Avoid trans fats, continue current dose, add krill oil caps  Preventative health care Given flu shot today  OSA (obstructive sleep apnea) Using CPAP does have occasional nosebleeds may try nasal saline and/or antibiotic ointment, report worsening symptoms  Obesity, unspecified Encouraged DASH diet and increased exercise

## 2012-11-09 NOTE — Telephone Encounter (Signed)
I have only seen patient once for acute visit for knee pain over 1 months ago-- MRI showed torn MCL and ACL.  Was referred to orthopedics.  Patient's PCP is Dr Abner Greenspan who he saw 2 days ago.  I will defer refill to her because he is in her care.

## 2012-11-09 NOTE — Assessment & Plan Note (Signed)
Using CPAP does have occasional nosebleeds may try nasal saline and/or antibiotic ointment, report worsening symptoms

## 2012-11-09 NOTE — Telephone Encounter (Signed)
Please check with him and see if he would still like this, he saw Selena Batten for an urgent visit then saw me and did not mention that he needed more of this. OK to send in Naproxen 550 mg 1 tab po bid prn pain with food, # 60 with 1 rf

## 2012-11-09 NOTE — Assessment & Plan Note (Signed)
Given flu shot  today 

## 2012-11-09 NOTE — Assessment & Plan Note (Signed)
Tolerating Pravastatin. Avoid trans fats, continue current dose, add krill oil caps

## 2012-11-09 NOTE — Assessment & Plan Note (Signed)
hgba1c up some encouraged to avoid simple carbs and increase exercise, increase Amaryl to 4 mg daily and continue Metformin

## 2012-11-09 NOTE — Telephone Encounter (Signed)
Spoke with pt. He states he had been taking this for his toe/foot pain and possible gout. Pt states he has not been formally diagnosed or treated for gout.  Advised pt he should schedule appt for further evaluation as specific labs should be ordered to check for gout and if positive may need other medications to treat.  Pt states he will have to call back Monday when he knows his schedule for next week.  Please advise.

## 2012-11-09 NOTE — Assessment & Plan Note (Signed)
Well controlled, no changes 

## 2012-11-09 NOTE — Assessment & Plan Note (Signed)
Encouraged DASH diet and increased exercise 

## 2012-11-09 NOTE — Telephone Encounter (Signed)
Please see Colin Bennett's note and Advise on refills/SLS Thanks.

## 2012-11-29 ENCOUNTER — Telehealth: Payer: Self-pay | Admitting: Family Medicine

## 2012-11-29 NOTE — Telephone Encounter (Signed)
Very unlikely to be the metformin, can use the bactroban ointment in the nose and hold the Metformin for one week. Just eat less carbs. Then if no improvement will refer

## 2012-11-29 NOTE — Telephone Encounter (Signed)
So if it is just increasing in frequency and he wants referral to ENT for Cautery I can set that up without appt. If he is actively bleeding and cannot stop the bleeding he needs to be seen. If he wants to wait for referral I am willing to send in Bactroban ointment to apply to b/l nares via clean qtip qhs #1 tube til seen

## 2012-11-29 NOTE — Telephone Encounter (Signed)
Colin Bennett/Patient Phone [phone] called about nosebleeds; "blood is bright red."  No answer at time of call back.  Left message to call back, if assistance still needed.

## 2012-11-29 NOTE — Telephone Encounter (Signed)
Please Advise

## 2012-11-29 NOTE — Telephone Encounter (Signed)
Spoke with pt. He states nosebleeds seem to have returned after starting metformin.  Has had increase of nosebleeds in the last 2 weeks. Sometimes has clots and sometimes it is very watery. Nosebleeds usually stop after a couple of minutes, today it last 45 minutes (was very watery and bright red).  Pt wants to know if you think metformin could be causing this? Pt states he is undecided about ENT referral at this time.  Please advise.

## 2012-11-29 NOTE — Telephone Encounter (Signed)
Left detailed message on home # to call and let us know if he is wanting referral or having active bleeding that he is unable to stop?

## 2012-11-29 NOTE — Telephone Encounter (Signed)
Patient called in stating that he is having more nosebleeds and wanted to know what Dr. Abner Greenspan recommend he do?

## 2012-12-01 NOTE — Telephone Encounter (Signed)
Patient informed, understood & agreed/SLS  

## 2012-12-22 ENCOUNTER — Other Ambulatory Visit: Payer: Self-pay | Admitting: Family Medicine

## 2013-02-06 ENCOUNTER — Ambulatory Visit: Payer: BC Managed Care – PPO | Admitting: Family Medicine

## 2013-02-07 ENCOUNTER — Ambulatory Visit: Payer: BC Managed Care – PPO | Admitting: Family Medicine

## 2013-02-17 ENCOUNTER — Other Ambulatory Visit: Payer: Self-pay | Admitting: Family Medicine

## 2013-02-17 ENCOUNTER — Telehealth: Payer: Self-pay

## 2013-02-17 DIAGNOSIS — E785 Hyperlipidemia, unspecified: Secondary | ICD-10-CM

## 2013-02-17 DIAGNOSIS — I10 Essential (primary) hypertension: Secondary | ICD-10-CM

## 2013-02-17 DIAGNOSIS — E119 Type 2 diabetes mellitus without complications: Secondary | ICD-10-CM

## 2013-02-17 LAB — HEPATIC FUNCTION PANEL
ALBUMIN: 3.6 g/dL (ref 3.5–5.2)
ALT: 28 U/L (ref 0–53)
AST: 15 U/L (ref 0–37)
Alkaline Phosphatase: 66 U/L (ref 39–117)
BILIRUBIN TOTAL: 0.3 mg/dL (ref 0.2–1.2)
Bilirubin, Direct: 0.1 mg/dL (ref 0.0–0.3)
Indirect Bilirubin: 0.2 mg/dL (ref 0.2–1.2)
Total Protein: 6.2 g/dL (ref 6.0–8.3)

## 2013-02-17 LAB — CBC
HCT: 46 % (ref 39.0–52.0)
HEMOGLOBIN: 15.4 g/dL (ref 13.0–17.0)
MCH: 29.9 pg (ref 26.0–34.0)
MCHC: 33.5 g/dL (ref 30.0–36.0)
MCV: 89.3 fL (ref 78.0–100.0)
PLATELETS: 258 10*3/uL (ref 150–400)
RBC: 5.15 MIL/uL (ref 4.22–5.81)
RDW: 14 % (ref 11.5–15.5)
WBC: 7.8 10*3/uL (ref 4.0–10.5)

## 2013-02-17 LAB — LIPID PANEL
Cholesterol: 152 mg/dL (ref 0–200)
HDL: 32 mg/dL — AB (ref >39)
LDL CALC: 97 mg/dL (ref 0–99)
TRIGLYCERIDES: 113 mg/dL (ref <150)
Total CHOL/HDL Ratio: 4.8 Ratio
VLDL: 23 mg/dL (ref 0–40)

## 2013-02-17 LAB — RENAL FUNCTION PANEL
ALBUMIN: 3.6 g/dL (ref 3.5–5.2)
BUN: 15 mg/dL (ref 6–23)
CO2: 28 meq/L (ref 19–32)
CREATININE: 0.83 mg/dL (ref 0.50–1.35)
Calcium: 9 mg/dL (ref 8.4–10.5)
Chloride: 102 mEq/L (ref 96–112)
Glucose, Bld: 109 mg/dL — ABNORMAL HIGH (ref 70–99)
PHOSPHORUS: 3.2 mg/dL (ref 2.3–4.6)
Potassium: 4.3 mEq/L (ref 3.5–5.3)
Sodium: 137 mEq/L (ref 135–145)

## 2013-02-17 NOTE — Telephone Encounter (Signed)
Patient came in for labs to be drawn. Please advise which labs need to be ran?

## 2013-02-18 LAB — HEMOGLOBIN A1C
HEMOGLOBIN A1C: 6.4 % — AB (ref <5.7)
MEAN PLASMA GLUCOSE: 137 mg/dL — AB (ref <117)

## 2013-02-18 LAB — TSH: TSH: 0.928 u[IU]/mL (ref 0.350–4.500)

## 2013-02-21 ENCOUNTER — Ambulatory Visit: Payer: BC Managed Care – PPO | Admitting: Family Medicine

## 2013-02-28 ENCOUNTER — Other Ambulatory Visit: Payer: Self-pay | Admitting: Family Medicine

## 2013-03-01 NOTE — Telephone Encounter (Signed)
Rx request to pharmacy/SLS  

## 2013-03-16 ENCOUNTER — Ambulatory Visit: Payer: Commercial Managed Care - PPO | Admitting: Family Medicine

## 2013-03-22 ENCOUNTER — Encounter: Payer: Self-pay | Admitting: Physician Assistant

## 2013-03-22 ENCOUNTER — Ambulatory Visit (INDEPENDENT_AMBULATORY_CARE_PROVIDER_SITE_OTHER): Payer: Commercial Managed Care - PPO | Admitting: Physician Assistant

## 2013-03-22 ENCOUNTER — Ambulatory Visit (HOSPITAL_BASED_OUTPATIENT_CLINIC_OR_DEPARTMENT_OTHER)
Admission: RE | Admit: 2013-03-22 | Discharge: 2013-03-22 | Disposition: A | Discharge status: Home / Self Care | Payer: Commercial Managed Care - PPO | Source: Ambulatory Visit | Attending: Physician Assistant | Admitting: Physician Assistant

## 2013-03-22 ENCOUNTER — Ambulatory Visit: Payer: Commercial Managed Care - PPO | Admitting: Physician Assistant

## 2013-03-22 VITALS — BP 122/82 | HR 100 | Temp 98.2°F | Resp 18 | Ht 75.0 in | Wt 369.8 lb

## 2013-03-22 DIAGNOSIS — M25519 Pain in unspecified shoulder: Secondary | ICD-10-CM | POA: Insufficient documentation

## 2013-03-22 DIAGNOSIS — M25819 Other specified joint disorders, unspecified shoulder: Secondary | ICD-10-CM

## 2013-03-22 DIAGNOSIS — M7541 Impingement syndrome of right shoulder: Secondary | ICD-10-CM

## 2013-03-22 DIAGNOSIS — M7580 Other shoulder lesions, unspecified shoulder: Secondary | ICD-10-CM

## 2013-03-22 MED ORDER — PREDNISONE 20 MG PO TABS: ORAL_TABLET | ORAL | Status: DC

## 2013-03-22 MED ORDER — OXYCODONE-ACETAMINOPHEN 5-325 MG PO TABS: 2.0000 | ORAL_TABLET | ORAL | Status: DC | PRN

## 2013-03-22 MED ORDER — METAXALONE 800 MG PO TABS: 800.0000 mg | ORAL_TABLET | Freq: Three times a day (TID) | ORAL | Status: DC

## 2013-03-22 NOTE — Patient Instructions (Signed)
Please take medications as directed.  Take percocet if needed for severe pain.  Try Skelaxin at bedtime.  Take prednisone taper.  Rest.  Avoid heavy lifting.  Go downstairs for imaging.  I will call you with your results. Please schedule an appointment with your Orthopedist.

## 2013-03-22 NOTE — Progress Notes (Signed)
Patient presents to clinic today c/o right deltoid pain with numbness and tingling of RUE that has been present intermittently over the past couple of weeks.  Pain is worse with rest and with laying on affected side.  Denies known trauma or inciting event.  Does endorse heavy weight lifting at the gym. Endorses some pain with ROM.  Denies weakness. Has history of disc disease but of lumbar spine.  Past Medical History  Diagnosis Date  . Kidney stones   . Diabetes mellitus   . Bipolar 1 disorder   . HTN (hypertension) 05/10/2012  . Preventative health care 05/14/2012  . Renal lithiasis 08/10/2012  . Bipolar disorder, unspecified 08/10/2012  . Obesity, unspecified 11/09/2012    Current Outpatient Prescriptions on File Prior to Visit  Medication Sig Dispense Refill  . glimepiride (AMARYL) 4 MG tablet TAKE ONE TABLET BY MOUTH ONCE DAILY BEFORE  BREAKFAST  30 tablet  2  . lamoTRIgine (LAMICTAL) 25 MG tablet Take 75 mg by mouth daily.       Marland Kitchen lisinopril (PRINIVIL,ZESTRIL) 10 MG tablet Take 1 tablet (10 mg total) by mouth daily.  90 tablet  3  . metFORMIN (GLUCOPHAGE) 500 MG tablet Take 1 tablet (500 mg total) by mouth at bedtime.  90 tablet  1  . naproxen sodium (ANAPROX) 550 MG tablet Take 1 tablet (550 mg total) by mouth 2 (two) times daily with a meal.  60 tablet  0  . pravastatin (PRAVACHOL) 10 MG tablet Take 1 tablet (10 mg total) by mouth daily.  30 tablet  5  . venlafaxine XR (EFFEXOR-XR) 37.5 MG 24 hr capsule Take 75 mg by mouth daily.        No current facility-administered medications on file prior to visit.    No Known Allergies  Family History  Problem Relation Age of Onset  . Hypertension Other   . Diabetes Other     History   Social History  . Marital Status: Married    Spouse Name: N/A    Number of Children: 0  . Years of Education: N/A   Occupational History  . Collateral recovery    Social History Main Topics  . Smoking status: Never Smoker   . Smokeless  tobacco: Never Used  . Alcohol Use: No  . Drug Use: No  . Sexual Activity: None   Other Topics Concern  . None   Social History Narrative  . None   Review of Systems - See HPI.  All other ROS are negative.  BP 122/82  Pulse 100  Temp(Src) 98.2 F (36.8 C) (Oral)  Resp 18  Ht 6\' 3"  (1.905 m)  Wt 369 lb 12 oz (167.717 kg)  BMI 46.22 kg/m2  SpO2 97%  Physical Exam  Vitals reviewed. Constitutional: He is oriented to person, place, and time and well-developed, well-nourished, and in no distress.  HENT:  Head: Normocephalic and atraumatic.  Eyes: Conjunctivae are normal. Pupils are equal, round, and reactive to light.  Neck: Normal range of motion. Neck supple.  Cardiovascular: Normal rate, regular rhythm, normal heart sounds and intact distal pulses.   Pulmonary/Chest: Effort normal and breath sounds normal.  Musculoskeletal:       Right shoulder: He exhibits decreased range of motion, tenderness, pain and spasm. He exhibits no bony tenderness, no swelling, normal pulse and normal strength.       Right elbow: Normal.      Right wrist: Normal.       Cervical back: Normal.  Neurological: He is alert and oriented to person, place, and time.  Skin: Skin is warm and dry. No rash noted.    Recent Results (from the past 2160 hour(s))  LIPID PANEL     Status: Abnormal   Collection Time    02/17/13  5:31 PM      Result Value Ref Range   Cholesterol 152  0 - 200 mg/dL   Comment: ATP III Classification:           < 200        mg/dL        Desirable          200 - 239     mg/dL        Borderline High          >= 240        mg/dL        High         Triglycerides 113  <150 mg/dL   HDL 32 (*) >39 mg/dL   Total CHOL/HDL Ratio 4.8     VLDL 23  0 - 40 mg/dL   LDL Cholesterol 97  0 - 99 mg/dL   Comment:       Total Cholesterol/HDL Ratio:CHD Risk                            Coronary Heart Disease Risk Table                                            Men       Women               1/2 Average Risk              3.4        3.3                  Average Risk              5.0        4.4               2X Average Risk              9.6        7.1               3X Average Risk             23.4       11.0     Use the calculated Patient Ratio above and the CHD Risk table      to determine the patient's CHD Risk.     ATP III Classification (LDL):           < 100        mg/dL         Optimal          100 - 129     mg/dL         Near or Above Optimal          130 - 159     mg/dL         Borderline High          160 - 189     mg/dL         High           >  190        mg/dL         Very High        RENAL FUNCTION PANEL     Status: Abnormal   Collection Time    02/17/13  5:31 PM      Result Value Ref Range   Sodium 137  135 - 145 mEq/L   Potassium 4.3  3.5 - 5.3 mEq/L   Chloride 102  96 - 112 mEq/L   CO2 28  19 - 32 mEq/L   Glucose, Bld 109 (*) 70 - 99 mg/dL   BUN 15  6 - 23 mg/dL   Creat 0.83  0.50 - 1.35 mg/dL   Albumin 3.6  3.5 - 5.2 g/dL   Calcium 9.0  8.4 - 10.5 mg/dL   Phosphorus 3.2  2.3 - 4.6 mg/dL  HEPATIC FUNCTION PANEL     Status: None   Collection Time    02/17/13  5:31 PM      Result Value Ref Range   Total Bilirubin 0.3  0.2 - 1.2 mg/dL   Comment: ** Please note change in reference range(s). **   Bilirubin, Direct 0.1  0.0 - 0.3 mg/dL   Indirect Bilirubin 0.2  0.2 - 1.2 mg/dL   Comment: ** Please note change in reference range(s). **   Alkaline Phosphatase 66  39 - 117 U/L   AST 15  0 - 37 U/L   ALT 28  0 - 53 U/L   Total Protein 6.2  6.0 - 8.3 g/dL   Albumin 3.6  3.5 - 5.2 g/dL  TSH     Status: None   Collection Time    02/17/13  5:31 PM      Result Value Ref Range   TSH 0.928  0.350 - 4.500 uIU/mL  CBC     Status: None   Collection Time    02/17/13  5:31 PM      Result Value Ref Range   WBC 7.8  4.0 - 10.5 K/uL   RBC 5.15  4.22 - 5.81 MIL/uL   Hemoglobin 15.4  13.0 - 17.0 g/dL   HCT 46.0  39.0 - 52.0 %   MCV 89.3  78.0 - 100.0 fL   MCH  29.9  26.0 - 34.0 pg   MCHC 33.5  30.0 - 36.0 g/dL   RDW 14.0  11.5 - 15.5 %   Platelets 258  150 - 400 K/uL  HEMOGLOBIN A1C     Status: Abnormal   Collection Time    02/17/13  5:31 PM      Result Value Ref Range   Hemoglobin A1C 6.4 (*) <5.7 %   Comment:                                                                            According to the ADA Clinical Practice Recommendations for 2011, when     HbA1c is used as a screening test:             >=6.5%   Diagnostic of Diabetes Mellitus                (if abnormal result is confirmed)  5.7-6.4%   Increased risk of developing Diabetes Mellitus           References:Diagnosis and Classification of Diabetes Mellitus,Diabetes     [id number] 1):S62-S69 and Standards of Medical Care in             Diabetes - 2011,Diabetes [id number] (Suppl 1):S11-S61.         Mean Plasma Glucose 137 (*) <117 mg/dL    Assessment/Plan: Impingement syndrome of right shoulder region Dg Right shoulder.  Pain with forward extension of right arm.  Rx Percocet for severe pain.  Rx skelaxin for spasm.  Steroid burst given - short course.  Patient instructed to monitor blood sugar.  Stop steroid if glucose nearing 300. Patient is to schedule an appointment with his Orthopedist.

## 2013-03-22 NOTE — Progress Notes (Signed)
Pre visit review using our clinic review tool, if applicable. No additional management support is needed unless otherwise documented below in the visit note/SLS  

## 2013-03-30 DIAGNOSIS — M7541 Impingement syndrome of right shoulder: Secondary | ICD-10-CM | POA: Insufficient documentation

## 2013-03-30 HISTORY — DX: Impingement syndrome of right shoulder: M75.41

## 2013-03-30 NOTE — Assessment & Plan Note (Signed)
Dg Right shoulder.  Pain with forward extension of right arm.  Rx Percocet for severe pain.  Rx skelaxin for spasm.  Steroid burst given - short course.  Patient instructed to monitor blood sugar.  Stop steroid if glucose nearing 300. Patient is to schedule an appointment with his Orthopedist.

## 2013-04-04 ENCOUNTER — Ambulatory Visit (INDEPENDENT_AMBULATORY_CARE_PROVIDER_SITE_OTHER): Payer: Commercial Managed Care - PPO | Admitting: Family Medicine

## 2013-04-04 ENCOUNTER — Encounter: Payer: Self-pay | Admitting: Family Medicine

## 2013-04-04 VITALS — BP 110/92 | HR 105 | Temp 97.6°F | Ht 75.0 in | Wt 362.1 lb

## 2013-04-04 DIAGNOSIS — E669 Obesity, unspecified: Secondary | ICD-10-CM

## 2013-04-04 DIAGNOSIS — M25519 Pain in unspecified shoulder: Secondary | ICD-10-CM

## 2013-04-04 DIAGNOSIS — E1110 Type 2 diabetes mellitus with ketoacidosis without coma: Secondary | ICD-10-CM

## 2013-04-04 DIAGNOSIS — I10 Essential (primary) hypertension: Secondary | ICD-10-CM

## 2013-04-04 DIAGNOSIS — E785 Hyperlipidemia, unspecified: Secondary | ICD-10-CM

## 2013-04-04 DIAGNOSIS — M25511 Pain in right shoulder: Secondary | ICD-10-CM

## 2013-04-04 DIAGNOSIS — G4733 Obstructive sleep apnea (adult) (pediatric): Secondary | ICD-10-CM

## 2013-04-04 DIAGNOSIS — E119 Type 2 diabetes mellitus without complications: Secondary | ICD-10-CM

## 2013-04-04 DIAGNOSIS — E1310 Other specified diabetes mellitus with ketoacidosis without coma: Secondary | ICD-10-CM

## 2013-04-04 MED ORDER — PRAVASTATIN SODIUM 10 MG PO TABS: 10.0000 mg | ORAL_TABLET | Freq: Every day | ORAL | Status: DC

## 2013-04-04 MED ORDER — GLIMEPIRIDE 4 MG PO TABS: 4.0000 mg | ORAL_TABLET | Freq: Every day | ORAL | Status: DC

## 2013-04-04 NOTE — Patient Instructions (Signed)
Rotator Cuff Tendinitis  Rotator cuff tendinitis is inflammation of the tough, cord-like bands that connect muscle to bone (tendons) in your rotator cuff. Your rotator cuff is the collection of all the muscles and tendons that connect your arm to your shoulder. Your rotator cuff holds the head of your upper arm bone (humerus) in the cup (fossa) of your shoulder blade (scapula). CAUSES Rotator cuff tendinitis is usually caused by overusing the joint involved.  SIGNS AND SYMPTOMS  Deep ache in the shoulder also felt on the outside upper arm over the shoulder muscle.  Point tenderness over the area that is injured.  Pain comes on gradually and becomes worse with lifting the arm to the side (abduction) or turning it inward (internal rotation).  May lead to a chronic tear: When a rotator cuff tendon becomes inflamed, it runs the risk of losing its blood supply, causing some tendon fibers to die. This increases the risk that the tendon can fray and partially or completely tear. DIAGNOSIS Rotator cuff tendinitis is diagnosed by taking a medical history, performing a physical exam, and reviewing results of imaging exams. The medical history is useful to help determine the type of rotator cuff injury. The physical exam will include looking at the injured shoulder, feeling the injured area, and watching you do range-of-motion exercises. X-ray exams are typically done to rule out other causes of shoulder pain, such as fractures. MRI is the imaging exam usually used for significant shoulder injuries. Sometimes a dye study called CT arthrogram is done, but it is not as widely used as MRI. In some institutions, special ultrasound tests may also be used to aid in the diagnosis. TREATMENT  Less Severe Cases  Use of a sling to rest the shoulder for a short period of time. Prolonged use of the sling can cause stiffness, weakness, and loss of motion of the shoulder joint.  Anti-inflammatory medicines, such as  ibuprofen or naproxen sodium, may be prescribed. More Severe Cases  Physical therapy.  Use of steroid injections into the shoulder joint.  Surgery. HOME CARE INSTRUCTIONS   Use a sling or splint until the pain decreases. Prolonged use of the sling can cause stiffness, weakness, and loss of motion of the shoulder joint.  Apply ice to the injured area:  Put ice in a plastic bag.  Place a towel between your skin and the bag.  Leave the ice on for 20 minutes, 2 3 times a day.  Try to avoid use other than gentle range of motion while your shoulder is painful. Use the shoulder and exercise only as directed by your health care provider. Stop exercises or range of motion if pain or discomfort increases, unless directed otherwise by your health care provider.  Only take over-the-counter or prescription medicines for pain, discomfort, or fever as directed by your health care provider.  If you were given a shoulder sling and straps (immobilizer), do not remove it except as directed, or until you see a health care provider for a follow-up exam. If you need to remove it, move your arm as little as possible or as directed.  You may want to sleep on several pillows at night to lessen swelling and pain. SEEK IMMEDIATE MEDICAL CARE IF:   Your shoulder pain increases or new pain develops in your arm, hand, or fingers and is not relieved with medicines.  You have new, unexplained symptoms, especially increased numbness in the hands or loss of strength.  You develop any worsening of the   problems that brought you in for care.  Your arm, hand, or fingers are numb or tingling.  Your arm, hand, or fingers are swollen, painful, or turn white or blue. MAKE SURE YOU:  Understand these instructions.  Will watch your condition.  Will get help right away if you are not doing well or get worse. Document Released: 03/28/2003 Document Revised: 10/26/2012 Document Reviewed: 08/17/2012 [employer] Patient  Information 2014 [employer].  

## 2013-04-04 NOTE — Progress Notes (Signed)
Pre visit review using our clinic review tool, if applicable. No additional management support is needed unless otherwise documented below in the visit note. 

## 2013-04-09 ENCOUNTER — Encounter: Payer: Self-pay | Admitting: Family Medicine

## 2013-04-09 DIAGNOSIS — M75101 Unspecified rotator cuff tear or rupture of right shoulder, not specified as traumatic: Secondary | ICD-10-CM | POA: Insufficient documentation

## 2013-04-09 DIAGNOSIS — M25511 Pain in right shoulder: Secondary | ICD-10-CM

## 2013-04-09 HISTORY — DX: Unspecified rotator cuff tear or rupture of right shoulder, not specified as traumatic: M75.101

## 2013-04-09 HISTORY — DX: Pain in right shoulder: M25.511

## 2013-04-09 NOTE — Assessment & Plan Note (Signed)
Well controlled, no changes to meds. Encouraged heart healthy diet such as the DASH diet and exercise as tolerated.  °

## 2013-04-09 NOTE — Assessment & Plan Note (Signed)
Tolerating statin, encouraged heart healthy diet, avoid trans fats, minimize simple carbs and saturated fats. Increase exercise as tolerated 

## 2013-04-09 NOTE — Assessment & Plan Note (Signed)
Encouraged DASH diet, decrease po intake and increase exercise as tolerated. Needs 7-8 hours of sleep nightly. Avoid trans fats, eat small, frequent meals every 4-5 hours with lean proteins, complex carbs and healthy fats. Minimize simple carbs, GMO foods. 

## 2013-04-09 NOTE — Assessment & Plan Note (Signed)
Follows with Dr Elsworth Soho. Using CPAP daily

## 2013-04-09 NOTE — Progress Notes (Signed)
Patient ID: Colin Bennett, male   DOB: 25-Mar-1980, 33 y.o.   MRN: [id number] CAI ANFINSON [id number] 1981-01-07 04/09/2013      Progress Note-Follow Up  Subjective  Chief Complaint  Chief Complaint  Patient presents with  . Follow-up    3 month    HPI  Patient is a 33 year old male who is in today for followup. He is generally doing well but is complaining of some right shoulder pain. He does frequently lifting and has been having increased pain especially with extension and rotation at his right shoulder. No significant radicular symptoms, weakness or paresthesias. No falls or trauma. Does also note some long-standing crepitus in his left shoulder. Otherwise no recent illness. Denies CP/palp/SOB/HA/congestion/fevers/GI or GU c/o. Taking meds as prescribed  Past Medical History  Diagnosis Date  . Kidney stones   . Diabetes mellitus   . Bipolar 1 disorder   . HTN (hypertension) 05/10/2012  . Preventative health care 05/14/2012  . Renal lithiasis 08/10/2012  . Bipolar disorder, unspecified 08/10/2012  . Obesity, unspecified 11/09/2012  . Pain, joint, shoulder, right 04/09/2013    History reviewed. No pertinent past surgical history.  Family History  Problem Relation Age of Onset  . Hypertension Other   . Diabetes Other     History   Social History  . Marital Status: Married    Spouse Name: N/A    Number of Children: 0  . Years of Education: N/A   Occupational History  . Collateral recovery    Social History Main Topics  . Smoking status: Never Smoker   . Smokeless tobacco: Never Used  . Alcohol Use: No  . Drug Use: No  . Sexual Activity: Not on file   Other Topics Concern  . Not on file   Social History Narrative  . No narrative on file    Current Outpatient Prescriptions on File Prior to Visit  Medication Sig Dispense Refill  . lisinopril (PRINIVIL,ZESTRIL) 10 MG tablet Take 1 tablet (10 mg total) by mouth daily.  90 tablet  3  . metaxalone (SKELAXIN) 800  MG tablet Take 1 tablet (800 mg total) by mouth 3 (three) times daily.  30 tablet  0  . metFORMIN (GLUCOPHAGE) 500 MG tablet Take 1 tablet (500 mg total) by mouth at bedtime.  90 tablet  1  . naproxen sodium (ANAPROX) 550 MG tablet Take 1 tablet (550 mg total) by mouth 2 (two) times daily with a meal.  60 tablet  0  . oxyCODONE-acetaminophen (PERCOCET/ROXICET) 5-325 MG per tablet Take 2 tablets by mouth every 4 (four) hours as needed.  12 tablet  0   No current facility-administered medications on file prior to visit.    No Known Allergies  Review of Systems  Review of Systems  Constitutional: Negative for fever and malaise/fatigue.  HENT: Negative for congestion.   Eyes: Negative for discharge.  Respiratory: Negative for shortness of breath.   Cardiovascular: Negative for chest pain, palpitations and leg swelling.  Gastrointestinal: Negative for nausea, abdominal pain and diarrhea.  Genitourinary: Negative for dysuria.  Musculoskeletal: Positive for joint pain. Negative for falls.       Right shoulder pain worse with extension and left shoulder crepitus  Skin: Negative for rash.  Neurological: Negative for loss of consciousness and headaches.  Endo/Heme/Allergies: Negative for polydipsia.  Psychiatric/Behavioral: Negative for depression and suicidal ideas. The patient is not nervous/anxious and does not have insomnia.     Objective  BP 110/92  Pulse 105  Temp(Src) 97.6 F (36.4 C) (Oral)  Ht 6\' 3"  (1.905 m)  Wt 362 lb 1.3 oz (164.238 kg)  BMI 45.26 kg/m2  SpO2 95%  Physical Exam  Physical Exam  Constitutional: He is oriented to person, place, and time and well-developed, well-nourished, and in no distress. No distress.  HENT:  Head: Normocephalic and atraumatic.  Eyes: Conjunctivae are normal.  Neck: Neck supple. No thyromegaly present.  Cardiovascular: Normal rate, regular rhythm and normal heart sounds.   No murmur heard. Pulmonary/Chest: Effort normal and breath  sounds normal. No respiratory distress.  Abdominal: He exhibits no distension and no mass. There is no tenderness.  Musculoskeletal: He exhibits no edema.  Neurological: He is alert and oriented to person, place, and time.  Skin: Skin is warm.  Psychiatric: Memory, affect and judgment normal.    Lab Results  Component Value Date   TSH 0.928 02/17/2013   Lab Results  Component Value Date   WBC 7.8 02/17/2013   HGB 15.4 02/17/2013   HCT 46.0 02/17/2013   MCV 89.3 02/17/2013   PLT 258 02/17/2013   Lab Results  Component Value Date   CREATININE 0.83 02/17/2013   BUN 15 02/17/2013   NA 137 02/17/2013   K 4.3 02/17/2013   CL 102 02/17/2013   CO2 28 02/17/2013   Lab Results  Component Value Date   ALT 28 02/17/2013   AST 15 02/17/2013   ALKPHOS 66 02/17/2013   BILITOT 0.3 02/17/2013   Lab Results  Component Value Date   CHOL 152 02/17/2013   Lab Results  Component Value Date   HDL 32* 02/17/2013   Lab Results  Component Value Date   LDLCALC 97 02/17/2013   Lab Results  Component Value Date   TRIG 113 02/17/2013   Lab Results  Component Value Date   CHOLHDL 4.8 02/17/2013     Assessment & Plan  HTN (hypertension) Well controlled, no changes to meds. Encouraged heart healthy diet such as the DASH diet and exercise as tolerated.   Obesity, unspecified Encouraged DASH diet, decrease po intake and increase exercise as tolerated. Needs 7-8 hours of sleep nightly. Avoid trans fats, eat small, frequent meals every 4-5 hours with lean proteins, complex carbs and healthy fats. Minimize simple carbs, GMO foods.  Hyperlipidemia Tolerating statin, encouraged heart healthy diet, avoid trans fats, minimize simple carbs and saturated fats. Increase exercise as tolerated  DM (diabetes mellitus) hgba1c acceptable, minimize simple carbs. Increase exercise as tolerated. Continue current meds  OSA (obstructive sleep apnea) Follows with Dr Elsworth Soho. Using CPAP daily  Pain, joint, shoulder,  right Referred to sports medicine for further consideration due to nonresponse to conservative measures thus far. Continue meds pr

## 2013-04-09 NOTE — Assessment & Plan Note (Signed)
hgba1c acceptable, minimize simple carbs. Increase exercise as tolerated. Continue current meds 

## 2013-04-09 NOTE — Assessment & Plan Note (Signed)
Referred to sports medicine for further consideration due to nonresponse to conservative measures thus far. Continue meds pr

## 2013-04-12 ENCOUNTER — Telehealth: Payer: Self-pay

## 2013-04-12 NOTE — Telephone Encounter (Signed)
Relevant patient education assigned to patient using Emmi. ° °

## 2013-04-13 ENCOUNTER — Ambulatory Visit: Payer: Commercial Managed Care - PPO | Admitting: Family Medicine

## 2013-04-17 ENCOUNTER — Ambulatory Visit (INDEPENDENT_AMBULATORY_CARE_PROVIDER_SITE_OTHER): Payer: Commercial Managed Care - PPO | Admitting: Family Medicine

## 2013-04-17 ENCOUNTER — Other Ambulatory Visit (INDEPENDENT_AMBULATORY_CARE_PROVIDER_SITE_OTHER): Payer: Commercial Managed Care - PPO

## 2013-04-17 ENCOUNTER — Encounter: Payer: Self-pay | Admitting: Family Medicine

## 2013-04-17 VITALS — BP 122/80 | HR 84 | Wt 366.0 lb

## 2013-04-17 DIAGNOSIS — M25519 Pain in unspecified shoulder: Secondary | ICD-10-CM

## 2013-04-17 DIAGNOSIS — M25511 Pain in right shoulder: Secondary | ICD-10-CM

## 2013-04-17 DIAGNOSIS — S43429A Sprain of unspecified rotator cuff capsule, initial encounter: Secondary | ICD-10-CM

## 2013-04-17 DIAGNOSIS — M75100 Unspecified rotator cuff tear or rupture of unspecified shoulder, not specified as traumatic: Secondary | ICD-10-CM

## 2013-04-17 MED ORDER — MELOXICAM 15 MG PO TABS: 15.0000 mg | ORAL_TABLET | Freq: Every day | ORAL | Status: DC

## 2013-04-17 NOTE — Assessment & Plan Note (Signed)
Patient did do very well with ultrasound guidance injection today with near complete resolution of pain immediately. We did this secondary to patient's body habitus. Patient given home exercise program, anti-inflammatories per orders, as well as an icing protocol. Patient will try this and come back again in 3 weeks for further evaluation.

## 2013-04-17 NOTE — Patient Instructions (Signed)
Nice to meet you Ice 20 minutes 2 times daily Exercises 4 times a week Meloxicam daily for 10 days then as needed Come back again in 3 weeks.

## 2013-04-17 NOTE — Progress Notes (Signed)
[name] [address] [place], [place] [address] Phone: [phone] Subjective:    I'm seeing this patient by the request  of:  Penni Homans, MD   CC: Right shoulder pain  Colin Bennett:[id number] KAIUS Colin Bennett is a 33 y.o. male coming in with complaint of right shoulder pain. Patient does do a lot of weightlifting and has noted some increasing pain especially with extension as well as rotation of the right shoulder. Patient was doing the job recently where he had to do a lot of lifting which seemed to aggravate the shoulder. Patient states he does have some radiation going down his arm from time to time but denies any numbness. Patient is a radiation can all the way to his thumb. Patient denies any significant neck pain with this. Patient denies any fevers or chills or any abnormal weight loss. Patient states that the pain can wake him up at night and this the severity of 7/10. Patient states it's not responding to over-the-counter medications.     Past medical history, social, surgical and family history all reviewed in electronic medical record.   Review of Systems: No headache, visual changes, nausea, vomiting, diarrhea, constipation, dizziness, abdominal pain, skin rash, fevers, chills, night sweats, weight loss, swollen lymph nodes, body aches, joint swelling, muscle aches, chest pain, shortness of breath, mood changes.   Objective Blood pressure 122/80, pulse 84, weight 366 lb (166.017 kg), SpO2 97.00%.  General: No apparent distress alert and oriented x3 mood and affect normal, dressed appropriately. obese HEENT: Pupils equal, extraocular movements intact  Respiratory: Patient's speak in full sentences and does not appear short of breath  Cardiovascular: No lower extremity edema, non tender, no erythema  Skin: Warm dry intact with no signs of infection or rash on extremities or on axial skeleton.  Abdomen: Soft nontender  Neuro: Cranial nerves II through  XII are intact, neurovascularly intact in all extremities with 2+ DTRs and 2+ pulses.  Lymph: No lymphadenopathy of posterior or anterior cervical chain or axillae bilaterally.  Gait normal with good balance and coordination.  MSK:  Non tender with full range of motion and good stability and symmetric strength and tone of  elbows, wrist, hip, knee and ankles bilaterally.  Neck: Inspection unremarkable. No palpable stepoffs. Negative Spurling's maneuver. Full neck range of motion Grip strength and sensation normal in bilateral hands Strength good C4 to T1 distribution No sensory change to C4 to T1 Negative Hoffman sign bilaterally Reflexes normal Shoulder: Right Inspection reveals no abnormalities, atrophy or asymmetry. Palpation is normal with no tenderness over AC joint or bicipital groove. Patient actively does have restricted range of motion with forward flexion of 130 and internal rotation to lateral hip. Patient does have full passive range of motion no. Rotator cuff strength normal throughout. signs of impingement with positive Neer and Hawkin's tests, empty can sign. Speeds and Yergason's tests normal. No labral pathology noted with negative Obrien's, negative clunk and good stability. Normal scapular function observed. No painful arc and no drop arm sign. No apprehension sign Contralateral side unremarkable  X-rays reviewed by me. Patient did have x-rays that show a type II acromion but otherwise no bony abnormalities.  MSK US performed of: Right shoulder This study was ordered, performed, and interpreted by Charlann Boxer D.O.  Shoulder:   Supraspinatus:  Appears  on long and transverse views  does have a very small intersubstance tear, no bursal bulge seen with shoulder abduction on impingement view. Patient does  have impingement with lateral abduction secondary to patient's acromion. Infraspinatus:  Appears normal on long and transverse views. Subscapularis:  Appears normal  on long and transverse views. Teres Minor:  Appears normal on long and transverse views. AC joint:  Capsule undistended, no geyser sign. Glenohumeral Joint:  Appears normal without effusion. Glenoid Labrum:  Intact without visualized tears. Biceps Tendon:  Appears normal on long and transverse views, no fraying of tendon, tendon located in intertubercular groove, no subluxation with shoulder internal or external rotation. No increased power doppler signal.  Impression: Small intersubstance tear of the supraspinatus with positive impingement  Procedure: Real-time Ultrasound Guided Injection of right glenohumeral joint Device: GE Logiq E  Ultrasound guided injection is preferred based studies that show increased duration, increased effect, greater accuracy, decreased procedural pain, increased response rate with ultrasound guided versus blind injection.  Verbal informed consent obtained.  Time-out conducted.  Noted no overlying erythema, induration, or other signs of local infection.  Skin prepped in a sterile fashion.  Local anesthesia: Topical Ethyl chloride.  With sterile technique and under real time ultrasound guidance:  Joint visualized.  23g 1  inch needle inserted posterior approach. Pictures taken for needle placement. Patient did have injection of 2 cc of 1% lidocaine, 2 cc of 0.5% Marcaine, and 1.0 cc of Kenalog 40 mg/dL. Completed without difficulty  Pain immediately resolved suggesting accurate placement of the medication.  Advised to call if fevers/chills, erythema, induration, drainage, or persistent bleeding.  Images permanently stored and available for review in the ultrasound unit.  Impression: Technically successful ultrasound guided injection.      Impression and Recommendations:     This case required medical decision making of moderate complexity.

## 2013-04-26 ENCOUNTER — Other Ambulatory Visit: Payer: Self-pay | Admitting: Physician Assistant

## 2013-04-26 DIAGNOSIS — M7541 Impingement syndrome of right shoulder: Secondary | ICD-10-CM

## 2013-04-26 MED ORDER — METAXALONE 800 MG PO TABS: 800.0000 mg | ORAL_TABLET | Freq: Three times a day (TID) | ORAL | Status: DC

## 2013-04-26 NOTE — Telephone Encounter (Signed)
Medication has been refilled.

## 2013-05-01 ENCOUNTER — Other Ambulatory Visit: Payer: Self-pay | Admitting: Family Medicine

## 2013-05-11 ENCOUNTER — Ambulatory Visit: Payer: Commercial Managed Care - PPO | Admitting: Family Medicine

## 2013-05-11 DIAGNOSIS — Z0289 Encounter for other administrative examinations: Secondary | ICD-10-CM

## 2013-05-15 ENCOUNTER — Ambulatory Visit (INDEPENDENT_AMBULATORY_CARE_PROVIDER_SITE_OTHER): Payer: Commercial Managed Care - PPO | Admitting: Family Medicine

## 2013-05-15 ENCOUNTER — Encounter: Payer: Self-pay | Admitting: Family Medicine

## 2013-05-15 VITALS — BP 120/82 | HR 126 | Wt 366.0 lb

## 2013-05-15 DIAGNOSIS — S43429A Sprain of unspecified rotator cuff capsule, initial encounter: Secondary | ICD-10-CM

## 2013-05-15 DIAGNOSIS — M75100 Unspecified rotator cuff tear or rupture of unspecified shoulder, not specified as traumatic: Secondary | ICD-10-CM

## 2013-05-15 NOTE — Patient Instructions (Signed)
It is good to see you again.  exercises 3 times a week for another 6 weeks.  Ice when you need it See me when you need me.

## 2013-05-15 NOTE — Progress Notes (Signed)
  [facility] [address] [place], [place] [address] Phone: [phone] Subjective:    CC: Right shoulder pain follow up  JQB:HALPFXTKWI Colin Bennett is a 33 y.o. male coming in with complaint of right shoulder pain patient was seen previously and was diagnosed with an intersubstance tear of the supraspinatus. Patient was given an intra-articular injection, home exercises, icing the discussed anti-inflammatories. Patient states that he is 100% better. Patient is having no pain and is able to do all his activities of daily living. Patient is sleeping comfortable he. Patient is not taking any pain medications at this time. Patient is very happy with the results.     Past medical history, social, surgical and family history all reviewed in electronic medical record.   Review of Systems: No headache, visual changes, nausea, vomiting, diarrhea, constipation, dizziness, abdominal pain, skin rash, fevers, chills, night sweats, weight loss, swollen lymph nodes, body aches, joint swelling, muscle aches, chest pain, shortness of breath, mood changes.   Objective Blood pressure 120/82, pulse 126, weight 366 lb (166.017 kg), SpO2 97.00%.  General: No apparent distress alert and oriented x3 mood and affect normal, dressed appropriately. obese HEENT: Pupils equal, extraocular movements intact  Respiratory: Patient's speak in full sentences and does not appear short of breath  Cardiovascular: No lower extremity edema, non tender, no erythema  Skin: Warm dry intact with no signs of infection or rash on extremities or on axial skeleton.  Abdomen: Soft nontender  Neuro: Cranial nerves II through XII are intact, neurovascularly intact in all extremities with 2+ DTRs and 2+ pulses.  Lymph: No lymphadenopathy of posterior or anterior cervical chain or axillae bilaterally.  Gait normal with good balance and coordination.  MSK:  Non tender with full range of motion and good  stability and symmetric strength and tone of  elbows, wrist, hip, knee and ankles bilaterally.  Neck: Inspection unremarkable. No palpable stepoffs. Negative Spurling's maneuver. Full neck range of motion Grip strength and sensation normal in bilateral hands Strength good C4 to T1 distribution No sensory change to C4 to T1 Negative Hoffman sign bilaterally Reflexes normal Shoulder: Right Inspection reveals no abnormalities, atrophy or asymmetry. Palpation is normal with no tenderness over AC joint or bicipital groove. ROM is full in all planes. Rotator cuff strength normal throughout. No signs of impingement with negative Neer and Hawkin's tests, empty can sign. Speeds and Yergason's tests normal. No labral pathology noted with negative Obrien's, negative clunk and good stability. Normal scapular function observed. No painful arc and no drop arm sign. No apprehension sign Contralateral shoulder unremarkable      Impression and Recommendations:     This case required medical decision making of moderate complexity.

## 2013-05-15 NOTE — Assessment & Plan Note (Signed)
Patient is doing remarkably well and likely will continue to do well. But did not feel and volar ultrasound to change management today. Patient will continue exercises 3 times a week for 6 weeks. Patient will followup on an as-needed basis.

## 2013-05-19 LAB — HM DIABETES EYE EXAM

## 2013-06-05 ENCOUNTER — Telehealth: Payer: Self-pay | Admitting: *Deleted

## 2013-06-05 DIAGNOSIS — E119 Type 2 diabetes mellitus without complications: Secondary | ICD-10-CM

## 2013-06-05 DIAGNOSIS — I10 Essential (primary) hypertension: Secondary | ICD-10-CM

## 2013-06-05 DIAGNOSIS — E785 Hyperlipidemia, unspecified: Secondary | ICD-10-CM

## 2013-06-05 LAB — LIPID PANEL
CHOL/HDL RATIO: 3.5 ratio
Cholesterol: 154 mg/dL (ref 0–200)
HDL: 44 mg/dL (ref >39)
LDL Cholesterol: 87 mg/dL (ref 0–99)
Triglycerides: 114 mg/dL (ref <150)
VLDL: 23 mg/dL (ref 0–40)

## 2013-06-05 LAB — CBC
HEMATOCRIT: 44.8 % (ref 39.0–52.0)
Hemoglobin: 15.6 g/dL (ref 13.0–17.0)
MCH: 29.8 pg (ref 26.0–34.0)
MCHC: 34.8 g/dL (ref 30.0–36.0)
MCV: 85.7 fL (ref 78.0–100.0)
PLATELETS: 247 10*3/uL (ref 150–400)
RBC: 5.23 MIL/uL (ref 4.22–5.81)
RDW: 14.1 % (ref 11.5–15.5)
WBC: 9.1 10*3/uL (ref 4.0–10.5)

## 2013-06-05 LAB — HEPATIC FUNCTION PANEL
ALT: 22 U/L (ref 0–53)
AST: 12 U/L (ref 0–37)
Albumin: 3.7 g/dL (ref 3.5–5.2)
Alkaline Phosphatase: 74 U/L (ref 39–117)
BILIRUBIN TOTAL: 0.4 mg/dL (ref 0.2–1.2)
Bilirubin, Direct: 0.1 mg/dL (ref 0.0–0.3)
Indirect Bilirubin: 0.3 mg/dL (ref 0.2–1.2)
Total Protein: 6.2 g/dL (ref 6.0–8.3)

## 2013-06-05 LAB — RENAL FUNCTION PANEL
Albumin: 3.7 g/dL (ref 3.5–5.2)
BUN: 15 mg/dL (ref 6–23)
CALCIUM: 9.3 mg/dL (ref 8.4–10.5)
CHLORIDE: 102 meq/L (ref 96–112)
CO2: 25 mEq/L (ref 19–32)
CREATININE: 0.68 mg/dL (ref 0.50–1.35)
Glucose, Bld: 92 mg/dL (ref 70–99)
PHOSPHORUS: 3.6 mg/dL (ref 2.3–4.6)
POTASSIUM: 4.4 meq/L (ref 3.5–5.3)
Sodium: 137 mEq/L (ref 135–145)

## 2013-06-05 LAB — TSH: TSH: 1.01 u[IU]/mL (ref 0.350–4.500)

## 2013-06-05 LAB — HEMOGLOBIN A1C
Hgb A1c MFr Bld: 6.3 % — ABNORMAL HIGH (ref <5.7)
MEAN PLASMA GLUCOSE: 134 mg/dL — AB (ref <117)

## 2013-06-05 NOTE — Telephone Encounter (Signed)
Pt presented to the lab. Orders entered as below:  Return in about 8 weeks (around 05/30/2013) for follow up DM, HTN, hi chol, labs prior, lipid, renal, cbc, tsh, hepatic, hgba1c prior.

## 2013-06-06 ENCOUNTER — Ambulatory Visit (INDEPENDENT_AMBULATORY_CARE_PROVIDER_SITE_OTHER): Payer: Commercial Managed Care - PPO | Admitting: Family Medicine

## 2013-06-06 ENCOUNTER — Encounter: Payer: Self-pay | Admitting: Family Medicine

## 2013-06-06 VITALS — BP 118/90 | HR 107 | Temp 99.0°F | Ht 75.0 in | Wt 369.1 lb

## 2013-06-06 DIAGNOSIS — M79609 Pain in unspecified limb: Secondary | ICD-10-CM

## 2013-06-06 DIAGNOSIS — R040 Epistaxis: Secondary | ICD-10-CM

## 2013-06-06 DIAGNOSIS — M7580 Other shoulder lesions, unspecified shoulder: Secondary | ICD-10-CM

## 2013-06-06 DIAGNOSIS — M7541 Impingement syndrome of right shoulder: Secondary | ICD-10-CM

## 2013-06-06 DIAGNOSIS — E119 Type 2 diabetes mellitus without complications: Secondary | ICD-10-CM

## 2013-06-06 DIAGNOSIS — I10 Essential (primary) hypertension: Secondary | ICD-10-CM

## 2013-06-06 DIAGNOSIS — E785 Hyperlipidemia, unspecified: Secondary | ICD-10-CM

## 2013-06-06 DIAGNOSIS — M79603 Pain in arm, unspecified: Secondary | ICD-10-CM

## 2013-06-06 DIAGNOSIS — R350 Frequency of micturition: Secondary | ICD-10-CM

## 2013-06-06 DIAGNOSIS — M25819 Other specified joint disorders, unspecified shoulder: Secondary | ICD-10-CM

## 2013-06-06 DIAGNOSIS — E669 Obesity, unspecified: Secondary | ICD-10-CM

## 2013-06-06 LAB — POCT URINALYSIS DIPSTICK
Blood, UA: NEGATIVE
GLUCOSE UA: NEGATIVE
Ketones, UA: NEGATIVE
LEUKOCYTES UA: NEGATIVE
Nitrite, UA: NEGATIVE
Protein, UA: NEGATIVE
SPEC GRAV UA: 1.02
UROBILINOGEN UA: 0.2
pH, UA: 5

## 2013-06-06 MED ORDER — METAXALONE 800 MG PO TABS: 800.0000 mg | ORAL_TABLET | Freq: Three times a day (TID) | ORAL | Status: DC | PRN

## 2013-06-06 MED ORDER — AMLODIPINE BESYLATE 2.5 MG PO TABS: 2.5000 mg | ORAL_TABLET | Freq: Every day | ORAL | Status: DC

## 2013-06-06 MED ORDER — MUPIROCIN 2 % EX OINT: 1.0000 "application " | TOPICAL_OINTMENT | Freq: Every day | CUTANEOUS | Status: DC | PRN

## 2013-06-06 NOTE — Progress Notes (Signed)
Pre visit review using our clinic review tool, if applicable. No additional management support is needed unless otherwise documented below in the visit note. 

## 2013-06-06 NOTE — Patient Instructions (Addendum)
Needs annual in 6 months with labs and 4 week bp check    Nosebleed Nosebleeds can be caused by many conditions including trauma, infections, polyps, foreign bodies, dry mucous membranes or climate, medications and air conditioning. Most nosebleeds occur in the front of the nose. It is because of this location that most nosebleeds can be controlled by pinching the nostrils gently and continuously. Do this for at least 10 to 20 minutes. The reason for this long continuous pressure is that you must hold it long enough for the blood to clot. If during that 10 to 20 minute time period, pressure is released, the process may have to be started again. The nosebleed may stop by itself, quit with pressure, need concentrated heating (cautery) or stop with pressure from packing. HOME CARE INSTRUCTIONS   If your nose was packed, try to maintain the pack inside until your caregiver removes it. If a gauze pack was used and it starts to fall out, gently replace or cut the end off. Do not cut if a balloon catheter was used to pack the nose. Otherwise, do not remove unless instructed.  Avoid blowing your nose for 12 hours after treatment. This could dislodge the pack or clot and start bleeding again.  If the bleeding starts again, sit up and bending forward, gently pinch the front half of your nose continuously for 20 minutes.  If bleeding was caused by dry mucous membranes, cover the inside of your nose every morning with a petroleum or antibiotic ointment. Use your little fingertip as an applicator. Do this as needed during dry weather. This will keep the mucous membranes moist and allow them to heal.  Maintain humidity in your home by using less air conditioning or using a humidifier.  Do not use aspirin or medications which make bleeding more likely. Your caregiver can give you recommendations on this.  Resume normal activities as able but try to avoid straining, lifting or bending at the waist for several  days.  If the nosebleeds become recurrent and the cause is unknown, your caregiver may suggest laboratory tests. SEEK IMMEDIATE MEDICAL CARE IF:   Bleeding recurs and cannot be controlled.  There is unusual bleeding from or bruising on other parts of the body.  You have a fever.  Nosebleeds continue.  There is any worsening of the condition which originally brought you in.  You become lightheaded, feel faint, become sweaty or vomit blood. MAKE SURE YOU:   Understand these instructions.  Will watch your condition.  Will get help right away if you are not doing well or get worse. Document Released: 10/15/2004 Document Revised: 03/30/2011 Document Reviewed: 12/07/2008 [facility] Patient Information 2014 [place], [place].

## 2013-06-10 LAB — CULTURE, URINE COMPREHENSIVE
COLONY COUNT: NO GROWTH
Organism ID, Bacteria: NO GROWTH

## 2013-06-11 ENCOUNTER — Encounter: Payer: Self-pay | Admitting: Family Medicine

## 2013-06-11 DIAGNOSIS — R040 Epistaxis: Secondary | ICD-10-CM | POA: Insufficient documentation

## 2013-06-11 DIAGNOSIS — R350 Frequency of micturition: Secondary | ICD-10-CM | POA: Insufficient documentation

## 2013-06-11 HISTORY — DX: Frequency of micturition: R35.0

## 2013-06-11 NOTE — Assessment & Plan Note (Signed)
Improved with treatment. No c/o today

## 2013-06-11 NOTE — Assessment & Plan Note (Signed)
Always on left and long standing, his father has a similar problem. No other bleeding concerns. Will try Mupirocin qhs and if worsens will call for referral to ENT

## 2013-06-11 NOTE — Assessment & Plan Note (Signed)
Tolerating statin, encouraged heart healthy diet, avoid trans fats, minimize simple carbs and saturated fats. Increase exercise as tolerated 

## 2013-06-11 NOTE — Assessment & Plan Note (Signed)
hgba1c acceptable, minimize simple carbs. Increase exercise as tolerated. Continue current meds 

## 2013-06-11 NOTE — Progress Notes (Signed)
Patient ID: Colin Bennett, male   DOB: [birth year], 33 y.o.   MRN: [id number] JOCSAN MCGINLEY [id number] [birth year] 06/11/2013      Progress Note-Follow Up  Subjective  Chief Complaint  Chief Complaint  Patient presents with  . Follow-up    HPI  Patient is a 33 year old male in today for routine medical care. Doing well. Her shoulder pain is essentially resolved with therapy. He notes using Skelaxin occasionally for low back pain but has good results. He is using his CPAP routinely. No recent illness. His largest complaint is a slight increase in his left nares epistaxis. He has had trouble with it off and on for years but it has become more frequent lately he had a bad episode yesterday. Is also noting some increased urinary frequency for the past month but denies any incontinence, dysuria or hematuria. Denies CP/palp/SOB/HA/congestion/fevers/GI c/o. Taking meds as prescribed  Past Medical History  Diagnosis Date  . Kidney stones   . Diabetes mellitus   . Bipolar 1 disorder   . HTN (hypertension) 05/10/2012  . Preventative health care 05/14/2012  . Renal lithiasis 08/10/2012  . Bipolar disorder, unspecified 08/10/2012  . Obesity, unspecified 11/09/2012  . Pain, joint, shoulder, right 04/09/2013    History reviewed. No pertinent past surgical history.  Family History  Problem Relation Age of Onset  . Hypertension Other   . Diabetes Other     History   Social History  . Marital Status: Married    Spouse Name: N/A    Number of Children: 0  . Years of Education: N/A   Occupational History  . Collateral recovery    Social History Main Topics  . Smoking status: Never Smoker   . Smokeless tobacco: Never Used  . Alcohol Use: No  . Drug Use: No  . Sexual Activity: Not on file   Other Topics Concern  . Not on file   Social History Narrative  . No narrative on file    Current Outpatient Prescriptions on File Prior to Visit  Medication Sig Dispense Refill  . glimepiride  (AMARYL) 4 MG tablet Take 1 tablet (4 mg total) by mouth daily with breakfast.  90 tablet  1  . lisinopril (PRINIVIL,ZESTRIL) 10 MG tablet Take 1 tablet (10 mg total) by mouth daily.  90 tablet  3  . meloxicam (MOBIC) 15 MG tablet Take 1 tablet (15 mg total) by mouth daily.  30 tablet  1  . metFORMIN (GLUCOPHAGE) 500 MG tablet TAKE ONE TABLET BY MOUTH AT BEDTIME  90 tablet  0  . naproxen sodium (ANAPROX) 550 MG tablet Take 1 tablet (550 mg total) by mouth 2 (two) times daily with a meal.  60 tablet  0  . oxyCODONE-acetaminophen (PERCOCET/ROXICET) 5-325 MG per tablet Take 2 tablets by mouth every 4 (four) hours as needed.  12 tablet  0  . pravastatin (PRAVACHOL) 10 MG tablet Take 1 tablet (10 mg total) by mouth daily.  90 tablet  1   No current facility-administered medications on file prior to visit.    No Known Allergies  Review of Systems  Review of Systems  Constitutional: Negative for fever and malaise/fatigue.  HENT: Negative for congestion.   Eyes: Negative for discharge.  Respiratory: Positive for sputum production. Negative for shortness of breath.   Cardiovascular: Negative for chest pain, palpitations and leg swelling.  Gastrointestinal: Negative for nausea, abdominal pain and diarrhea.  Genitourinary: Negative for dysuria.  Musculoskeletal: Positive for back pain. Negative  for falls.  Skin: Negative for rash.  Neurological: Negative for loss of consciousness and headaches.  Endo/Heme/Allergies: Negative for polydipsia.  Psychiatric/Behavioral: Negative for depression and suicidal ideas. The patient is not nervous/anxious and does not have insomnia.     Objective  BP 118/90  Pulse 107  Temp(Src) 99 F (37.2 C) (Oral)  Ht 6\' 3"  (1.905 m)  Wt 369 lb 1.3 oz (167.414 kg)  BMI 46.13 kg/m2  SpO2 95%  Physical Exam  Physical Exam  Constitutional: He is oriented to person, place, and time and well-developed, well-nourished, and in no distress. No distress.  HENT:   Head: Normocephalic and atraumatic.  Eyes: Conjunctivae are normal.  Neck: Neck supple. No thyromegaly present.  Cardiovascular: Normal rate, regular rhythm and normal heart sounds.   No murmur heard. Pulmonary/Chest: Effort normal and breath sounds normal. No respiratory distress.  Abdominal: He exhibits no distension and no mass. There is no tenderness.  Musculoskeletal: He exhibits no edema.  Neurological: He is alert and oriented to person, place, and time.  Skin: Skin is warm.  Psychiatric: Memory, affect and judgment normal.    Lab Results  Component Value Date   TSH 1.010 06/05/2013   Lab Results  Component Value Date   WBC 9.1 06/05/2013   HGB 15.6 06/05/2013   HCT 44.8 06/05/2013   MCV 85.7 06/05/2013   PLT 247 06/05/2013   Lab Results  Component Value Date   CREATININE 0.68 06/05/2013   BUN 15 06/05/2013   NA 137 06/05/2013   K 4.4 06/05/2013   CL 102 06/05/2013   CO2 25 06/05/2013   Lab Results  Component Value Date   ALT 22 06/05/2013   AST 12 06/05/2013   ALKPHOS 74 06/05/2013   BILITOT 0.4 06/05/2013   Lab Results  Component Value Date   CHOL 154 06/05/2013   Lab Results  Component Value Date   HDL 44 06/05/2013   Lab Results  Component Value Date   LDLCALC 87 06/05/2013   Lab Results  Component Value Date   TRIG 114 06/05/2013   Lab Results  Component Value Date   CHOLHDL 3.5 06/05/2013     Assessment & Plan  HTN (hypertension) Well controlled, no changes to meds. Encouraged heart healthy diet such as the DASH diet and exercise as tolerated.   Hyperlipidemia Tolerating statin, encouraged heart healthy diet, avoid trans fats, minimize simple carbs and saturated fats. Increase exercise as tolerated  DM (diabetes mellitus) hgba1c acceptable, minimize simple carbs. Increase exercise as tolerated. Continue current meds  Musculoskeletal arm pain Improved with treatment. No c/o today  Obesity, unspecified Encouraged DASH diet, decrease po intake and  increase exercise as tolerated. Needs 7-8 hours of sleep nightly. Avoid trans fats, eat small, frequent meals every 4-5 hours with lean proteins, complex carbs and healthy fats. Minimize simple carbs, GMO foods.  Frequent urination Urine culture neg   Epistaxis Always on left and long standing, his father has a similar problem. No other bleeding concerns. Will try Mupirocin qhs and if worsens will call for referral to ENT

## 2013-06-11 NOTE — Assessment & Plan Note (Signed)
Well controlled, no changes to meds. Encouraged heart healthy diet such as the DASH diet and exercise as tolerated.  °

## 2013-06-11 NOTE — Assessment & Plan Note (Signed)
Encouraged DASH diet, decrease po intake and increase exercise as tolerated. Needs 7-8 hours of sleep nightly. Avoid trans fats, eat small, frequent meals every 4-5 hours with lean proteins, complex carbs and healthy fats. Minimize simple carbs, GMO foods. 

## 2013-06-11 NOTE — Assessment & Plan Note (Signed)
Urine culture neg

## 2013-10-17 ENCOUNTER — Other Ambulatory Visit: Payer: Self-pay | Admitting: Family Medicine

## 2013-10-18 ENCOUNTER — Other Ambulatory Visit: Payer: Self-pay | Admitting: Family Medicine

## 2013-10-19 MED ORDER — LISINOPRIL 10 MG PO TABS: 10.0000 mg | ORAL_TABLET | Freq: Every day | ORAL | Status: DC

## 2013-10-31 ENCOUNTER — Ambulatory Visit (INDEPENDENT_AMBULATORY_CARE_PROVIDER_SITE_OTHER): Payer: Commercial Managed Care - PPO | Admitting: Family Medicine

## 2013-10-31 ENCOUNTER — Encounter: Payer: Self-pay | Admitting: Family Medicine

## 2013-10-31 VITALS — BP 118/82 | HR 95 | Ht 74.5 in | Wt 384.0 lb

## 2013-10-31 DIAGNOSIS — M9908 Segmental and somatic dysfunction of rib cage: Secondary | ICD-10-CM

## 2013-10-31 DIAGNOSIS — M549 Dorsalgia, unspecified: Secondary | ICD-10-CM

## 2013-10-31 DIAGNOSIS — M9902 Segmental and somatic dysfunction of thoracic region: Secondary | ICD-10-CM

## 2013-10-31 DIAGNOSIS — M999 Biomechanical lesion, unspecified: Secondary | ICD-10-CM

## 2013-10-31 DIAGNOSIS — M9903 Segmental and somatic dysfunction of lumbar region: Secondary | ICD-10-CM

## 2013-10-31 DIAGNOSIS — M546 Pain in thoracic spine: Secondary | ICD-10-CM

## 2013-10-31 HISTORY — DX: Dorsalgia, unspecified: M54.9

## 2013-10-31 HISTORY — DX: Biomechanical lesion, unspecified: M99.9

## 2013-10-31 NOTE — Progress Notes (Signed)
  [name] [address] [place], [place] [address] Phone: [phone] Subjective:      CC: Back pain  NID:[id number] Colin Bennett is a 33 y.o. male coming in with complaint of back pain. Patient has had back pain for multiple years and has seen other providers. Patient states most of his pain seems to be in the upper thoracic spine. Patient states sitting in the same posture for a long amount of time can give him significant discomfort. Patient states that there is no radiation in the arms or any numbness or weakness. Patient denies that actually lifting causes any worsening of the symptoms. Patient states in the morning though he has significant stiffness as well because the discomfort. Has not noticed any association with food or medications at seem to give him difficulty. Patient is a severity of 7/10. This patient does state that there is positive nighttime awakening sometimes. Has to take pain medications from time to time. Denies any lateralization and denies any radiation.    Past medical history, social, surgical and family history all reviewed in electronic medical record.   Review of Systems: No headache, visual changes, nausea, vomiting, diarrhea, constipation, dizziness, abdominal pain, skin rash, fevers, chills, night sweats, weight loss, swollen lymph nodes, body aches, joint swelling, muscle aches, chest pain, shortness of breath, mood changes.   Objective Blood pressure 118/82, pulse 95, height 6' 2.5" (1.892 m), weight 384 lb (174.181 kg), SpO2 95.00%.  General: No apparent distress alert and oriented x3 mood and affect normal, dressed appropriately. Morbidly obese HEENT: Pupils equal, extraocular movements intact  Respiratory: Patient's speak in full sentences and does not appear short of breath  Cardiovascular: No lower extremity edema, non tender, no erythema  Skin: Warm dry intact with no signs of infection or rash on extremities or on  axial skeleton.  Abdomen: Soft nontender  Neuro: Cranial nerves II through XII are intact, neurovascularly intact in all extremities with 2+ DTRs and 2+ pulses.  Lymph: No lymphadenopathy of posterior or anterior cervical chain or axillae bilaterally.  Gait normal with good balance and coordination.  MSK:  Non tender with full range of motion and good stability and symmetric strength and tone of shoulders, elbows, wrist, hip, knee and ankles bilaterally.  Back Exam:  Inspection: Significant for strength of the core patient does have anterior displacement of the shoulders. Weakness of the upper back muscles compared to the patient's mid back muscles. 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 Motion: Flexion 35 deg, Extension 35 deg, Side Bending to 45 deg bilaterally,  Rotation to 45 deg bilaterally  SLR laying: Negative  XSLR laying: Negative  Palpable tenderness: None. FABER: negative. Sensory change: Gross sensation intact to all lumbar and sacral dermatomes.  Reflexes: 2+ at both patellar tendons, 2+ at achilles tendons, Babinski's downgoing.  Strength at foot  Plantar-flexion: 5/5 Dorsi-flexion: 5/5 Eversion: 5/5 Inversion: 5/5  Leg strength  Quad: 5/5 Hamstring: 5/5 Hip flexor: 5/5 Hip abductors: 4/5  Gait unremarkable.  Osteopathic findings Cervical Neutral Thoracic T3 extended rotated and side bent right with elevated third rib and inhalation T6 extended rotated and side bent right Lumbar L2 flexed rotated and side bent right     Impression and Recommendations:     This case required medical decision making of moderate complexity.

## 2013-10-31 NOTE — Assessment & Plan Note (Signed)
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 We discussed how strengthening certain muscles will be more beneficial as well as working on postural exercises. Patient was given a handout exercises and showed proper technique today. We discussed over-the-counter medications that can be beneficial in prescription medications were given per orders. We discussed an icing regimen as well. We discussed different ergonomic changes patient can make it work as well. Patient did respond well to osteopathic manipulation will followup again in 3 weeks for further evaluation and treatment.  Spent greater than 25 minutes with patient face-to-face and had greater than 50% of counseling including as described above in assessment and plan.

## 2013-10-31 NOTE — Patient Instructions (Signed)
Good to see you New exercises 3 times a week with a bowflex Turmeric 500mg  times daily.  On wall heels, butt shoulder and head touching for 5 minutes daily.  In car tennis ball between shoulder blades.  Come back in 2-3 weeks and we will manipulate you.    Scapular Winging  with Rehab  Scapular winging syndrome is also known as serratus anterior palsy or long thoracic nerve injury. The condition is an uncommon injury to the nervous system. The condition is caused by injury to the long thoracic nerve that runs through the neck and shoulder. Injury to the shoulder, such as a fall or repetitive stress on the shoulder causes the nerve to become stretched. Occasionally the injury is the result of an infection of the nerve. Damage to the long thoracic nerve results in weakness of the serratus anterior muscle. The serratus anterior muscle is responsible for controlling the shoulder blade (scapula). Weakness in this muscle results in a instability (winging) of the scapula. SYMPTOMS   Pain and weakness in the shoulder (usually the back of the shoulder) that is often diffuse or unable to localize.  Loss of or decrease in shoulder function.  Upper back pain while sitting, due to the scapula pressing on the back of the chair.  Visible deformity in the back of the shoulder. CAUSES  Scapular winging is caused by stretching of the long thoracic nerve. Common mechanisms of injury include:  Viral illness.  Repetitive and/or stressful use of the shoulder.  Falling onto the shoulder with the head and neck stretched away from the shoulder. RISK INCREASES WITH:  Contact sports (football, rugby, lacrosse, or soccer).  Activities involving overhead arm movement (baseball, volleyball, or racquet sports).  Poor strength and flexibility. PREVENTION  Warm up and stretch properly before activity.  Allow for adequate recovery between workouts.  Maintain physical fitness:  Strength, flexibility, and  endurance.  Cardiovascular fitness.  Learn and use proper technique. When possible, have a coach correct improper technique. PROGNOSIS  Scapular winging normally resolves spontaneously within 18 months. In rare circumstances surgery is recommended.  RELATED COMPLICATIONS   Permanent nerve damage, including pain, numbness, tingle, or weakness.  Shoulder weakness.  Recurrent shoulder pain.  Inability to compete in athletics. TREATMENT Treatment initially involves resting from any activities that aggravate your symptoms. The use of ice and medication may help reduce pain and inflammation. The use of strengthening and stretching exercises may help reduce pain with activity, specifically shoulder exercises that improve range of motion. These exercises may be performed at home or with referral to a therapist. If symptoms persist for greater than 6 months despite non-surgical (conservative) treatment, then surgery may be recommended. Surgery is only used for the most serious cases and the purpose is to regain function, not to allow an athlete to return to sports. MEDICATION   If pain medication is necessary, then nonsteroidal anti-inflammatory medications, such as aspirin and ibuprofen, or other minor pain relievers, such as acetaminophen, are often recommended.  Do not take pain medication for 7 days before surgery.  Prescription pain relievers may be given if deemed necessary by your caregiver. Use only as directed and only as much as you need. HEAT AND COLD  Cold treatment (icing) relieves pain and reduces inflammation. Cold treatment should be applied for 10 to 15 minutes every 2 to 3 hours for inflammation and pain and immediately after any activity that aggravates your symptoms. Use ice packs or massage the area with a piece of ice (  ice massage).  Heat treatment may be used prior to performing the stretching and strengthening activities prescribed by your caregiver, physical therapist, or  athletic trainer. Use a heat pack or soak the injury in warm water. SEEK MEDICAL CARE IF:  Treatment seems to offer no benefit, or the condition worsens.  Any medications produce adverse side effects. EXERCISES  RANGE OF MOTION (ROM) AND STRETCHING EXERCISES - Scapular Winging (Serratus Anterior Palsy, Long Thoracic Nerve Injury)  These exercises may help you when beginning to rehabilitate your injury. Your symptoms may resolve with or without further involvement from your physician, physical therapist or athletic trainer. While completing these exercises, remember:   Restoring tissue flexibility helps normal motion to return to the joints. This allows healthier, less painful movement and activity.  An effective stretch should be held for at least 30 seconds.  A stretch should never be painful. You should only feel a gentle lengthening or release in the stretched tissue. ROM - Pendulum  Bend at the waist so that your right / left arm falls away from your body. Support yourself with your opposite hand on a solid surface, such as a table or a countertop.  Your right / left arm should be perpendicular to the ground. If it is not perpendicular, you need to lean over farther. Relax the muscles in your right / left arm and shoulder as much as possible.  Gently sway your hips and trunk so they move your right / left arm without any use of your right / left shoulder muscles.  Progress your movements so that your right / left arm moves side to side, then forward and backward, and finally, both clockwise and counterclockwise.  Complete __________ repetitions in each direction. Many people use this exercise to relieve discomfort in their shoulder as well as to gain range of motion. Repeat __________ times. Complete this exercise __________ times per day. STRETCH - Flexion, Seated   Sit in a firm chair so that your right / left forearm can rest on a table or on a table or countertop. Your right /  left elbow should rest below the height of your shoulder so that your shoulder feels supported and not tense or uncomfortable.  Keeping your right / left shoulder relaxed, lean forward at your waist, allowing your right / left hand to slide forward. Bend forward until you feel a moderate stretch in your shoulder, but before you feel an increase in your pain.  Hold __________ seconds. Slowly return to your starting position. Repeat __________ times. Complete this exercise __________ times per day.  STRETCH - Flexion, Standing  Stand with good posture. With an underhand grip on your right / left and an overhand grip on the opposite hand, grasp a broomstick or cane so that your hands are a little more than shoulder-width apart.  Keeping your right / left elbow straight and shoulder muscles relaxed, push the stick with your opposite hand to raise your right / left arm in front of your body and then overhead. Raise your arm until you feel a stretch in your right / left shoulder, but before you have increased shoulder pain.  Avoid shrugging your right / left shoulder as your arm rises by keeping your shoulder blade tucked down and toward your mid-back spine. Hold __________ seconds.  Slowly return to the starting position. Repeat __________ times. Complete this exercise __________ times per day. STRETCH - Abduction, Supine  Stand with good posture. With an underhand grip on your right /  left and an overhand grip on the opposite hand, grasp a broomstick or cane so that your hands are a little more than shoulder-width apart.  Keeping your right / left elbow straight and shoulder muscles relaxed, push the stick with your opposite hand to raise your right / left arm out to the side of your body and then overhead. Raise your arm until you feel a stretch in your right / left shoulder, but before you have increased shoulder pain.  Avoid shrugging your right / left shoulder as your arm rises by keeping your  shoulder blade tucked down and toward your mid-back spine. Hold __________ seconds.  Slowly return to the starting position. Repeat __________ times. Complete this exercise __________ times per day. ROM - Flexion, Active-Assisted  Lie on your back. You may bend your knees for comfort.  Grasp a broomstick or cane so your hands are about shoulder-width apart. Your right / left hand should grip the end of the stick/cane so that your hand is positioned "thumbs-up," as if you were about to shake hands.  Using your healthy arm to lead, raise your right / left arm overhead until you feel a gentle stretch in your shoulder. Hold __________ seconds.  Use the stick/cane to assist in returning your right / left arm to its starting position. Repeat __________ times. Complete this exercise __________ times per day.  STRENGTHENING EXERCISES - Scapular Winging (Serratus Anterior Palsy, Long Thoracic Nerve Injury) These exercises may help you when beginning to rehabilitate your injury. They may resolve your symptoms with or without further involvement from your physician, physical therapist or athletic trainer. While completing these exercises, remember:   Muscles can gain both the endurance and the strength needed for everyday activities through controlled exercises.  Complete these exercises as instructed by your physician, physical therapist or athletic trainer. Progress with the resistance and repetition exercises only as your caregiver advises.  You may experience muscle soreness or fatigue, but the pain or discomfort you are trying to eliminate should never worsen during these exercises. If this pain does worsen, stop and make certain you are following the directions exactly. If the pain is still present after adjustments, discontinue the exercise until you can discuss the trouble with your clinician.  During your recovery, avoid activity or exercises which involve actions that place your injured hand or  elbow above your head or behind your back or head. These positions stress the tissues which are trying to heal. STRENGTH - Scapular Depression and Adduction   With good posture, sit on a firm chair. Supported your arms in front of you with pillows, arm rests or a table top. Have your elbows in line with the sides of your body.  Gently draw your shoulder blades down and toward your mid-back spine. Gradually increase the tension without tensing the muscles along the top of your shoulders and the back of your neck.  Hold for __________ seconds. Slowly release the tension and relax your muscles completely before completing the next repetition.  After you have practiced this exercise, remove the arm support and complete it in standing as well as sitting. Repeat __________ times. Complete this exercise __________ times per day.  STRENGTH - Scapular Protractors, Standing   Stand arms-length away from a wall. Place your hands on the wall, keeping your elbows straight.  Begin by dropping your shoulder blades down and toward your mid-back spine.  To strengthen your protractors, keep your shoulder blades down, but slide them forward on your  rib cage. It will feel as if you are lifting the back of your rib cage away from the wall. This is a subtle motion and can be challenging to complete. Ask your clinician for further instruction if you are not sure you are doing the exercise correctly.  Hold for __________ seconds. Slowly return to the starting position, resting the muscles completely before completing the next repetition. Repeat __________ times. Complete this exercise __________ times per day. STRENGTH - Scapular Protractors, Supine  Lie on your back on a firm surface. Extend your right / left arm straight into the air while holding a __________ weight in your hand.  Keeping your head and back in place, lift your shoulder off the floor.  Hold __________ seconds. Slowly return to the starting  position and allow your muscles to relax completely before completing the next repetition. Repeat __________ times. Complete this exercise __________ times per day. STRENGTH - Scapular Protractors, Quadruped  Get onto your hands and knees with your shoulders directly over your hands (or as close as you comfortably can be).  Keeping your elbows locked, lift the back of your rib cage up into your shoulder blades so your mid-back rounds-out. Keep your neck muscles relaxed.  Hold this position for __________ seconds. Slowly return to the starting position and allow your muscles to relax completely before completing the next repetition. Repeat __________ times. Complete this exercise __________ times per day.  STRENGTH - Scapular Depressors  Keeping your feet on the floor, lift your bottom from the seat and lock your elbows.  Keeping your elbows straight, allow gravity to pull your body weight down. Your shoulders will rise toward your ears.  Raise your body against gravity by drawing your shoulder blades down your back, shortening the distance between your shoulders and ears. Although your feet should always maintain contact with the floor, your feet should progressively support less body weight as you get stronger.  Hold __________ seconds. In a controlled and slow manner, lower your body weight to begin the next repetition. Repeat __________ times. Complete this exercise __________ times per day.  STRENGTH - Shoulder Extensors, Prone  Lie on your stomach on a firm surface so that your right / left arm overhangs the edge. Rest your forehead on your opposite forearm. With your thumb facing away from your body and your elbow straight, hold a __________ weight in your hand.  Squeeze your right / left shoulder blade to your mid-back spine and then slowly raise your arm behind you to the height of the bed.  Hold for __________ seconds. Slowly reverse the directions and return to the starting  position, controlling the weight as you lower your arm. Repeat __________ times. Complete this exercise __________ times per day.  STRENGTH - Horizontal Abductors Choose one of the two oppositions to complete this exercise. Prone: lying on stomach:  Lie on your stomach on a firm surface so that your right / left arm overhangs the edge. Rest your forehead on your opposite forearm. With your palm facing the floor and your elbow straight, hold a __________ weight in your hand.  Squeeze your right / left shoulder blade to your mid-back spine and then slowly raise your arm to the height of the bed.  Hold for __________ seconds. Slowly reverse the directions and return to the starting position, controlling the weight as you lower your arm. Repeat __________ times. Complete this exercise __________ times per day. Standing:  Secure a rubber exercise band/tubing so that it  is at the height of your shoulders when you are either standing or sitting on a firm arm-less chair.  Grasp an end of the band/tubing in each hand and have your palms face each other. Straighten your elbows and lift your hands straight in front of you at shoulder height. Step back away from the secured end of band/tubing until it becomes tense.  Squeeze your shoulder blades together. Keeping your elbows locked and your hands at shoulder-height, bring your hands out to your side.  Hold __________ seconds. Slowly ease the tension on the band/tubing as you reverse the directions and return to the starting position. Repeat __________ times. Complete this exercise __________ times per day. STRENGTH - Scapular Retractors  Secure a rubber exercise band/tubing so that it is at the height of your shoulders when you are either standing or sitting on a firm arm-less chair.  With a palm-down grip, grasp an end of the band/tubing in each hand. Straighten your elbows and lift your hands straight in front of you at shoulder height. Step back  away from the secured end of band/tubing until it becomes tense.  Squeezing your shoulder blades together, draw your elbows back as you bend them. Keep your upper arm lifted away from your body throughout the exercise.  Hold __________ seconds. Slowly ease the tension on the band/tubing as you reverse the directions and return to the starting position. Repeat __________ times. Complete this exercise __________ times per day. STRENGTH - Shoulder Extensors   Secure a rubber exercise band/tubing so that it is at the height of your shoulders when you are either standing or sitting on a firm arm-less chair.  With a thumbs-up grip, grasp an end of the band/tubing in each hand. Straighten your elbows and lift your hands straight in front of you at shoulder height. Step back away from the secured end of band/tubing until it becomes tense.  Squeezing your shoulder blades together, pull your hands down to the sides of your thighs. Do not allow your hands to go behind you.  Hold for __________ seconds. Slowly ease the tension on the band/tubing as you reverse the directions and return to the starting position. Repeat __________ times. Complete this exercise __________ times per day.  STRENGTH - Scapular Retractors and External Rotators  Secure a rubber exercise band/tubing so that it is at the height of your shoulders when you are either standing or sitting on a firm arm-less chair.  With a palm-down grip, grasp an end of the band/tubing in each hand. Bend your elbows 90 degrees and lift your elbows to shoulder height at your sides. Step back away from the secured end of band/tubing until it becomes tense.  Squeezing your shoulder blades together, rotate your shoulder so that your upper arm and elbow remain stationary, but your fists travel upward to head-height.  Hold __________ for seconds. Slowly ease the tension on the band/tubing as you reverse the directions and return to the starting  position. Repeat __________ times. Complete this exercise __________ times per day.  STRENGTH - Scapular Retractors and External Rotators, Rowing  Secure a rubber exercise band/tubing so that it is at the height of your shoulders when you are either standing or sitting on a firm arm-less chair.  With a palm-down grip, grasp an end of the band/tubing in each hand. Straighten your elbows and lift your hands straight in front of you at shoulder height. Step back away from the secured end of band/tubing until it becomes tense.  Step 1: Squeeze your shoulder blades together. Bending your elbows, draw your hands to your chest as if you are rowing a boat. At the end of this motion, your hands and elbow should be at shoulder-height and your elbows should be out to your sides.  Step 2: Rotate your shoulder to raise your hands above your head. Your forearms should be vertical and your upper-arms should be horizontal.  Hold for __________ seconds. Slowly ease the tension on the band/tubing as you reverse the directions and return to the starting position. Repeat __________ times. Complete this exercise __________ times per day.  STRENGTH - Scapular Retractors and Elevators  Secure a rubber exercise band/tubing so that it is at the height of your shoulders when you are either standing or sitting on a firm arm-less chair.  With a thumbs-up grip, grasp an end of the band/tubing in each hand. Step back away from the secured end of band/tubing until it becomes tense.  Squeezing your shoulder blades together, straighten your elbows and lift your hands straight over your head.  Hold for __________ seconds. Slowly ease the tension on the band/tubing as you reverse the directions and return to the starting position. Repeat __________ times. Complete this exercise __________ times per day.  Document Released: 01/05/2005 Document Revised: 03/30/2011 Document Reviewed: 04/19/2008 [facility] Patient Information  2015 [place], [place]. This information is not intended to replace advice given to you by your health care provider. Make sure you discuss any questions you have with your health care provider.

## 2013-10-31 NOTE — Assessment & Plan Note (Signed)
Decision today to treat with OMT was based on Physical Exam  After verbal consent patient was treated with HVLA, ME, FPR techniques in acid, red, lumbar areas  Patient tolerated the procedure well with improvement in symptoms  Patient given exercises, stretches and lifestyle modifications  See medications in patient instructions if given  Patient will follow up in 2-3 weeks

## 2013-11-21 ENCOUNTER — Ambulatory Visit (INDEPENDENT_AMBULATORY_CARE_PROVIDER_SITE_OTHER): Payer: Commercial Managed Care - PPO | Admitting: Family Medicine

## 2013-11-21 ENCOUNTER — Encounter: Payer: Self-pay | Admitting: Family Medicine

## 2013-11-21 VITALS — BP 106/80 | HR 102 | Ht 75.0 in | Wt 380.0 lb

## 2013-11-21 DIAGNOSIS — M9908 Segmental and somatic dysfunction of rib cage: Secondary | ICD-10-CM

## 2013-11-21 DIAGNOSIS — M9902 Segmental and somatic dysfunction of thoracic region: Secondary | ICD-10-CM

## 2013-11-21 DIAGNOSIS — G2589 Other specified extrapyramidal and movement disorders: Secondary | ICD-10-CM

## 2013-11-21 DIAGNOSIS — M999 Biomechanical lesion, unspecified: Secondary | ICD-10-CM

## 2013-11-21 DIAGNOSIS — M9903 Segmental and somatic dysfunction of lumbar region: Secondary | ICD-10-CM

## 2013-11-21 HISTORY — DX: Other specified extrapyramidal and movement disorders: G25.89

## 2013-11-21 NOTE — Assessment & Plan Note (Signed)
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 Patient also has a very poor core strength overall. We discussed doing the exercises on a more regular basis and patient has elected to start formal physical therapy which he has had benefit previously. Patient will continue the medications that he has at this time. We discussed continuing the supplementation. Encourage patient to do the exercises and showed proper technique again today. Patient did respond fairly well to osteopathic manipulation again. Patient will come back and see me again in 3-4 weeks for further evaluation and treatment.  Spent greater than 25 minutes with patient face-to-face and had greater than 50% of counseling including as described above in assessment and plan.

## 2013-11-21 NOTE — Assessment & Plan Note (Signed)
Decision today to treat with OMT was based on Physical Exam  After verbal consent patient was treated with HVLA, ME, FPR techniques in thoracic, rib,, lumbar areas  Patient tolerated the procedure well with improvement in symptoms  Patient given exercises, stretches and lifestyle modifications  See medications in patient instructions if given  Patient will follow up in 3 weeks

## 2013-11-21 NOTE — Patient Instructions (Signed)
Good to see you. You are doing well Try the exercises in the handout Will get you in with physical therapy.  Continue the vitamins.  Ice is your friend See me again in 3-4 weeks.

## 2013-11-21 NOTE — Progress Notes (Signed)
  [name] [address], [place] [address] Phone: [phone] Subjective:      CC: Back painfollow-up  [id number] Colin Bennett is a 33 y.o. male coming in with complaint of back pain. 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 In addition to this patient was given home exercises, discussed over-the-counter medications, and was started on oral anti-inflammatories. Patient states he has not been very compliant with doing the exercises. Patient states though that he was doing significantly better after the osteopathic manipulation and only over the course last several days as he started having increasing pain again. Patient describes it as more of a dull aching pain. Denies any radiation into the hands or arms. Seems to be still localized around the shoulder blades. Denies any new symptoms. Is taking the over-the-counter supplementations.    Past medical history, social, surgical and family history all reviewed in electronic medical record.   Review of Systems: No headache, visual changes, nausea, vomiting, diarrhea, constipation, dizziness, abdominal pain, skin rash, fevers, chills, night sweats, weight loss, swollen lymph nodes, body aches, joint swelling, muscle aches, chest pain, shortness of breath, mood changes.   Objective Blood pressure 106/80, pulse 102, height 6\' 3"  (1.905 m), weight 380 lb (172.367 kg), SpO2 96 %.  General: No apparent distress alert and oriented x3 mood and affect normal, dressed appropriately. Morbidly obese HEENT: Pupils equal, extraocular movements intact  Respiratory: Patient's speak in full sentences and does not appear short of breath  Cardiovascular: No lower extremity edema, non tender, no erythema  Skin: Warm dry intact with no signs of infection or rash on extremities or on axial skeleton.  Abdomen: Soft nontender  Neuro: Cranial nerves II through XII are intact,  neurovascularly intact in all extremities with 2+ DTRs and 2+ pulses.  Lymph: No lymphadenopathy of posterior or anterior cervical chain or axillae bilaterally.  Gait normal with good balance and coordination.  MSK:  Non tender with full range of motion and good stability and symmetric strength and tone of shoulders, elbows, wrist, hip, knee and ankles bilaterally.  Back Exam:  Inspection: Significant for strength of the core patient does have anterior displacement of the shoulders. Weakness of the upper back muscles compared to the patient's mid back muscles. 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 Motion: Flexion 35 deg, Extension 35 deg, Side Bending to 45 deg bilaterally,  Rotation to 45 deg bilaterally  SLR laying: Negative  XSLR laying: Negative  Palpable tenderness: mild paraspinal musculature tenderness around the scapular bilaterally FABER: negative. Sensory change: Gross sensation intact to all lumbar and sacral dermatomes.  Reflexes: 2+ at both patellar tendons, 2+ at achilles tendons, Babinski's downgoing.  Strength at foot  Plantar-flexion: 5/5 Dorsi-flexion: 5/5 Eversion: 5/5 Inversion: 5/5  Leg strength  Quad: 5/5 Hamstring: 5/5 Hip flexor: 5/5 Hip abductors: 4/5  Gait unremarkable.  Osteopathic findings Cervical Neutral Thoracic T3 extended rotated and side bent right with elevated third rib and inhalation T6 extended rotated and side bent right T9 extended rotated and side bent left Lumbar L2 flexed rotated and side bent right     Impression and Recommendations:     This case required medical decision making of moderate complexity.

## 2013-12-04 ENCOUNTER — Ambulatory Visit (INDEPENDENT_AMBULATORY_CARE_PROVIDER_SITE_OTHER): Payer: Commercial Managed Care - PPO | Admitting: Family Medicine

## 2013-12-04 ENCOUNTER — Ambulatory Visit (INDEPENDENT_AMBULATORY_CARE_PROVIDER_SITE_OTHER): Payer: Commercial Managed Care - PPO

## 2013-12-04 ENCOUNTER — Encounter: Payer: Self-pay | Admitting: Family Medicine

## 2013-12-04 VITALS — BP 118/69 | HR 98 | Temp 97.9°F | Ht 75.0 in | Wt 379.8 lb

## 2013-12-04 DIAGNOSIS — E669 Obesity, unspecified: Secondary | ICD-10-CM

## 2013-12-04 DIAGNOSIS — E119 Type 2 diabetes mellitus without complications: Secondary | ICD-10-CM

## 2013-12-04 DIAGNOSIS — Z23 Encounter for immunization: Secondary | ICD-10-CM

## 2013-12-04 DIAGNOSIS — R0602 Shortness of breath: Secondary | ICD-10-CM

## 2013-12-04 DIAGNOSIS — E785 Hyperlipidemia, unspecified: Secondary | ICD-10-CM

## 2013-12-04 DIAGNOSIS — I10 Essential (primary) hypertension: Secondary | ICD-10-CM

## 2013-12-04 DIAGNOSIS — M546 Pain in thoracic spine: Secondary | ICD-10-CM

## 2013-12-04 MED ORDER — ALBUTEROL SULFATE HFA 108 (90 BASE) MCG/ACT IN AERS: 2.0000 | INHALATION_SPRAY | Freq: Four times a day (QID) | RESPIRATORY_TRACT | Status: DC | PRN

## 2013-12-04 MED ORDER — AMLODIPINE BESYLATE 5 MG PO TABS: 5.0000 mg | ORAL_TABLET | Freq: Every day | ORAL | Status: DC

## 2013-12-04 NOTE — Patient Instructions (Signed)
Seborrheic keratosis Cherry angiomas  Basic Carbohydrate Counting for Diabetes Mellitus Carbohydrate counting is a method for keeping track of the amount of carbohydrates you eat. Eating carbohydrates naturally increases the level of sugar (glucose) in your blood, so it is important for you to know the amount that is okay for you to have in every meal. Carbohydrate counting helps keep the level of glucose in your blood within normal limits. The amount of carbohydrates allowed is different for every person. A dietitian can help you calculate the amount that is right for you. Once you know the amount of carbohydrates you can have, you can count the carbohydrates in the foods you want to eat. Carbohydrates are found in the following foods:  Grains, such as breads and cereals.  Dried beans and soy products.  Starchy vegetables, such as potatoes, peas, and corn.  Fruit and fruit juices.  Milk and yogurt.  Sweets and snack foods, such as cake, cookies, candy, chips, soft drinks, and fruit drinks. CARBOHYDRATE COUNTING There are two ways to count the carbohydrates in your food. You can use either of the methods or a combination of both. Reading the "Nutrition Facts" on [place] The "Nutrition Facts" is an area that is included on the labels of almost all packaged food and beverages in the [place]. It includes the serving size of that food or beverage and information about the nutrients in each serving of the food, including the grams (g) of carbohydrate per serving.  Decide the number of servings of this food or beverage that you will be able to eat or drink. Multiply that number of servings by the number of grams of carbohydrate that is listed on the label for that serving. The total will be the amount of carbohydrates you will be having when you eat or drink this food or beverage. Learning Standard Serving Sizes of Food When you eat food that is not packaged or does not include  "Nutrition Facts" on the label, you need to measure the servings in order to count the amount of carbohydrates.A serving of most carbohydrate-rich foods contains about 15 g of carbohydrates. The following list includes serving sizes of carbohydrate-rich foods that provide 15 g ofcarbohydrate per serving:   1 slice of bread (1 oz) or 1 six-inch tortilla.    of a hamburger bun or English muffin.  4-6 crackers.   cup unsweetened dry cereal.    cup hot cereal.   cup rice or pasta.    cup mashed potatoes or  of a large baked potato.  1 cup fresh fruit or one small piece of fruit.    cup canned or frozen fruit or fruit juice.  1 cup milk.   cup plain fat-free yogurt or yogurt sweetened with artificial sweeteners.   cup cooked dried beans or starchy vegetable, such as peas, corn, or potatoes.  Decide the number of standard-size servings that you will eat. Multiply that number of servings by 15 (the grams of carbohydrates in that serving). For example, if you eat 2 cups of strawberries, you will have eaten 2 servings and 30 g of carbohydrates (2 servings x 15 g = 30 g). For foods such as soups and casseroles, in which more than one food is mixed in, you will need to count the carbohydrates in each food that is included. EXAMPLE OF CARBOHYDRATE COUNTING Sample Dinner  3 oz chicken breast.   cup of brown rice.   cup of corn.  1 cup milk.  1 cup strawberries with sugar-free whipped topping.  Carbohydrate Calculation Step 1: Identify the foods that contain carbohydrates:   Rice.   Corn.   Milk.   Strawberries. Step 2:Calculate the number of servings eaten of each:   2 servings of rice.   1 serving of corn.   1 serving of milk.   1 serving of strawberries. Step 3: Multiply each of those number of servings by 15 g:   2 servings of rice x 15 g = 30 g.   1 serving of corn x 15 g = 15 g.   1 serving of milk x 15 g = 15 g.   1 serving of  strawberries x 15 g = 15 g. Step 4: Add together all of the amounts to find the total grams of carbohydrates eaten: 30 g + 15 g + 15 g + 15 g = 75 g. Document Released: 01/05/2005 Document Revised: 05/22/2013 Document Reviewed: 12/02/2012 [employer] [facility] [employer] [facility] Patient Information 2015 [place], [place]. This information is not intended to replace advice given to you by your health care provider. Make sure you discuss any questions you have with your health care provider.

## 2013-12-04 NOTE — Progress Notes (Signed)
Pre visit review using our clinic review tool, if applicable. No additional management support is needed unless otherwise documented below in the visit note. 

## 2013-12-05 LAB — CBC
HCT: 49.6 % (ref 39.0–52.0)
Hemoglobin: 16.6 g/dL (ref 13.0–17.0)
MCHC: 33.6 g/dL (ref 30.0–36.0)
MCV: 86.9 fl (ref 78.0–100.0)
PLATELETS: 274 10*3/uL (ref 150.0–400.0)
RBC: 5.71 Mil/uL (ref 4.22–5.81)
RDW: 13.8 % (ref 11.5–15.5)
WBC: 11.5 10*3/uL — ABNORMAL HIGH (ref 4.0–10.5)

## 2013-12-05 LAB — TSH: TSH: 0.72 u[IU]/mL (ref 0.35–4.50)

## 2013-12-05 LAB — HEMOGLOBIN A1C: Hgb A1c MFr Bld: 8.8 % — ABNORMAL HIGH (ref 4.6–6.5)

## 2013-12-06 LAB — RENAL FUNCTION PANEL
Albumin: 4.2 g/dL (ref 3.5–5.2)
BUN: 12 mg/dL (ref 6–23)
CO2: 17 meq/L — AB (ref 19–32)
Calcium: 10 mg/dL (ref 8.4–10.5)
Chloride: 107 mEq/L (ref 96–112)
Creatinine, Ser: 0.8 mg/dL (ref 0.4–1.5)
GFR: 112.38 mL/min (ref >60.00)
Glucose, Bld: 137 mg/dL — ABNORMAL HIGH (ref 70–99)
POTASSIUM: 4.6 meq/L (ref 3.5–5.1)
Phosphorus: 3.9 mg/dL (ref 2.3–4.6)
SODIUM: 142 meq/L (ref 135–145)

## 2013-12-06 LAB — HEPATIC FUNCTION PANEL
ALK PHOS: 80 U/L (ref 39–117)
ALT: 61 U/L — AB (ref 0–53)
AST: 80 U/L — AB (ref 0–37)
Albumin: 4.2 g/dL (ref 3.5–5.2)
BILIRUBIN DIRECT: 0.1 mg/dL (ref 0.0–0.3)
BILIRUBIN TOTAL: 0.5 mg/dL (ref 0.2–1.2)
Total Protein: 8.1 g/dL (ref 6.0–8.3)

## 2013-12-06 LAB — LIPID PANEL
Cholesterol: 188 mg/dL (ref 0–200)
HDL: 42.4 mg/dL (ref >39.00)
LDL Cholesterol: 118 mg/dL — ABNORMAL HIGH (ref 0–99)
NonHDL: 145.6
Total CHOL/HDL Ratio: 4
Triglycerides: 139 mg/dL (ref 0.0–149.0)
VLDL: 27.8 mg/dL (ref 0.0–40.0)

## 2013-12-07 ENCOUNTER — Encounter: Payer: Self-pay | Admitting: Family Medicine

## 2013-12-07 MED ORDER — METFORMIN HCL 500 MG PO TABS: 500.0000 mg | ORAL_TABLET | ORAL | Status: DC

## 2013-12-10 ENCOUNTER — Encounter: Payer: Self-pay | Admitting: Family Medicine

## 2013-12-10 NOTE — Assessment & Plan Note (Signed)
Encouraged heart healthy diet, increase exercise, avoid trans fats, consider a krill oil cap daily 

## 2013-12-10 NOTE — Assessment & Plan Note (Signed)
Encouraged moist heat and gentle stretching as tolerated. May try NSAIDs and prescription meds as directed and report if symptoms worsen or seek immediate care 

## 2013-12-10 NOTE — Assessment & Plan Note (Signed)
Encouraged DASH diet, decrease po intake and increase exercise as tolerated. Needs 7-8 hours of sleep nightly. Avoid trans fats, eat small, frequent meals every 4-5 hours with lean proteins, complex carbs and healthy fats. Minimize simple carbs, 

## 2013-12-10 NOTE — Assessment & Plan Note (Signed)
Well controlled, no changes to meds. Encouraged heart healthy diet such as the DASH diet and exercise as tolerated.  °

## 2013-12-10 NOTE — Assessment & Plan Note (Signed)
hgba1c not acceptable, minimize simple carbs. Increase exercise as tolerated. Continue current meds but increase Metformin
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
## 2013-12-19 ENCOUNTER — Encounter: Payer: Self-pay | Admitting: Family Medicine

## 2013-12-19 ENCOUNTER — Ambulatory Visit (INDEPENDENT_AMBULATORY_CARE_PROVIDER_SITE_OTHER): Payer: Commercial Managed Care - PPO | Admitting: Family Medicine

## 2013-12-19 VITALS — BP 114/80 | HR 100 | Ht 75.0 in | Wt 380.0 lb

## 2013-12-19 DIAGNOSIS — M9908 Segmental and somatic dysfunction of rib cage: Secondary | ICD-10-CM

## 2013-12-19 DIAGNOSIS — M999 Biomechanical lesion, unspecified: Secondary | ICD-10-CM

## 2013-12-19 DIAGNOSIS — G2589 Other specified extrapyramidal and movement disorders: Secondary | ICD-10-CM

## 2013-12-19 DIAGNOSIS — M9902 Segmental and somatic dysfunction of thoracic region: Secondary | ICD-10-CM

## 2013-12-19 DIAGNOSIS — M9903 Segmental and somatic dysfunction of lumbar region: Secondary | ICD-10-CM

## 2013-12-19 MED ORDER — KETOROLAC TROMETHAMINE 60 MG/2ML IM SOLN
60.0000 mg | Freq: Once | INTRAMUSCULAR | Status: AC
  Administered 2013-12-19: 60 mg via INTRAMUSCULAR

## 2013-12-19 MED ORDER — BACLOFEN 10 MG PO TABS: 10.0000 mg | ORAL_TABLET | Freq: Three times a day (TID) | ORAL | Status: DC

## 2013-12-19 MED ORDER — PREDNISONE 50 MG PO TABS: 50.0000 mg | ORAL_TABLET | Freq: Every day | ORAL | Status: DC

## 2013-12-19 NOTE — Progress Notes (Signed)
  [facility] [address] [place], [place] [address] Phone: [phone] Subjective:      CC: Back painfollow-up  [id number] LIEF Colin Bennett is a 33 y.o. male coming in with complaint of back pain. 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 Patient has been doing formal physical therapy and has noticed some improvement. Patient though started doing repetitive activity again and started having increasing pain. Patient states that it feels more like a spasm in the shoulder girdle. States that he is needed narcotics on a regular basis now. Denies any radiation of pain or any numbness. Patient states though that it is so severe it is stopped him from regular daily activities.    Past medical history, social, surgical and family history all reviewed in electronic medical record.   Review of Systems: No headache, visual changes, nausea, vomiting, diarrhea, constipation, dizziness, abdominal pain, skin rash, fevers, chills, night sweats, weight loss, swollen lymph nodes, body aches, joint swelling, muscle aches, chest pain, shortness of breath, mood changes.   Objective Blood pressure 114/80, pulse 100, height 6\' 3"  (1.905 m), weight 380 lb (172.367 kg), SpO2 95 %.  General: No apparent distress alert and oriented x3 mood and affect normal, dressed appropriately. Morbidly obese HEENT: Pupils equal, extraocular movements intact  Respiratory: Patient's speak in full sentences and does not appear short of breath  Cardiovascular: No lower extremity edema, non tender, no erythema  Skin: Warm dry intact with no signs of infection or rash on extremities or on axial skeleton.  Abdomen: Soft nontender  Neuro: Cranial nerves II through XII are intact, neurovascularly intact in all extremities with 2+ DTRs and 2+ pulses.  Lymph: No lymphadenopathy of posterior or anterior cervical chain or axillae bilaterally.  Gait normal with  good balance and coordination.  MSK:  Non tender with full range of motion and good stability and symmetric strength and tone of shoulders, elbows, wrist, hip, knee and ankles bilaterally.  Back Exam:  Inspection: . 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 Patient does have spasming of the trapezius and rhomboids. Motion: Flexion 35 deg, Extension 35 deg, Side Bending to 45 deg bilaterally,  Rotation to 45 deg bilaterally  SLR laying: Negative  XSLR laying: Negative  Palpable tenderness: Severe tenderness over the paraspinal musculature. FABER: negative. Sensory change: Gross sensation intact to all lumbar and sacral dermatomes.  Reflexes: 2+ at both patellar tendons, 2+ at achilles tendons, Babinski's downgoing.  Strength at foot  Plantar-flexion: 5/5 Dorsi-flexion: 5/5 Eversion: 5/5 Inversion: 5/5  Leg strength  Quad: 5/5 Hamstring: 5/5 Hip flexor: 5/5 Hip abductors: 4/5  Gait unremarkable.  Osteopathic findings Cervical Neutral Thoracic T3 extended rotated and side bent right  T6 extended rotated and side bent right T9 extended rotated and side bent left Lumbar L2 flexed rotated and side bent right     Impression and Recommendations:     This case required medical decision making of moderate complexity.

## 2013-12-19 NOTE — Assessment & Plan Note (Signed)
Patient overall is having more about back spasm. I think this is secondary to the repetitive activity while he was standing for a longer amount of time. I do believe that patient would respond best to a short course of prednisone. We discussed that this may cause blood sugars to go up and patient's A1c did increase recently. Discuss that he will need to continue to monitor this closely. We discussed home exercises. Patient was given a prescription for baclofen as well. Patient will come back in one week to make sure he continues to improve. If not we may need to consider further imaging.

## 2013-12-19 NOTE — Assessment & Plan Note (Signed)
Decision today to treat with OMT was based on Physical Exam  After verbal consent patient was treated with HVLA, ME, FPR techniques in thoracic, rib,, lumbar areas  Patient tolerated the procedure well with improvement in symptoms  Patient given exercises, stretches and lifestyle modifications  See medications in patient instructions if given  Patient will follow up in 1 week

## 2013-12-19 NOTE — Addendum Note (Signed)
Addended by: Douglass Rivers T on: 12/19/2013 09:23 AM   Modules accepted: Orders

## 2013-12-19 NOTE — Patient Instructions (Signed)
Good to see you Prednisone daily for 5 days Baclofen up to 3 times daily.  Continue the exercises again in 48 hours.  Message could be good.  See me again in 1-2 weeks.

## 2014-01-02 ENCOUNTER — Ambulatory Visit: Payer: Commercial Managed Care - PPO | Admitting: Family Medicine

## 2014-01-10 ENCOUNTER — Encounter: Payer: Self-pay | Admitting: Family Medicine

## 2014-01-10 ENCOUNTER — Ambulatory Visit (INDEPENDENT_AMBULATORY_CARE_PROVIDER_SITE_OTHER): Payer: Commercial Managed Care - PPO | Admitting: Family Medicine

## 2014-01-10 VITALS — BP 112/70 | HR 112 | Wt 381.0 lb

## 2014-01-10 DIAGNOSIS — M9908 Segmental and somatic dysfunction of rib cage: Secondary | ICD-10-CM

## 2014-01-10 DIAGNOSIS — M9902 Segmental and somatic dysfunction of thoracic region: Secondary | ICD-10-CM

## 2014-01-10 DIAGNOSIS — M999 Biomechanical lesion, unspecified: Secondary | ICD-10-CM

## 2014-01-10 DIAGNOSIS — M9903 Segmental and somatic dysfunction of lumbar region: Secondary | ICD-10-CM

## 2014-01-10 DIAGNOSIS — M546 Pain in thoracic spine: Secondary | ICD-10-CM

## 2014-01-10 DIAGNOSIS — M75101 Unspecified rotator cuff tear or rupture of right shoulder, not specified as traumatic: Secondary | ICD-10-CM

## 2014-01-10 NOTE — Assessment & Plan Note (Signed)
Decision today to treat with OMT was based on Physical Exam  After verbal consent patient was treated with HVLA, ME, FPR techniques in thoracic, rib,, lumbar areas  Patient tolerated the procedure well with improvement in symptoms  Patient given exercises, stretches and lifestyle modifications  See medications in patient instructions if given  Patient will follow up in 4-6 week

## 2014-01-10 NOTE — Progress Notes (Signed)
[name] [address] [place], [place] [address] Phone: [phone] Subjective:    CC: Back painfollow-up  NTI:[id number] Colin Bennett is a 33 y.o. male coming in with complaint of back pain. 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 Patient has been doing formal physical therapy and has noticed some improvement. Patient at last exam though did started having increase activity secondary to repetitive work. Patient was given a 5 day burst of prednisone as well as baclofen secondary to the pain. Patient was told to decrease activity for 48 hours and then start the exercises again. Patient states significant better. Patient states still some mild discomfort but overall feeling much better.  Patient is having recurrent right shoulder pain. Patient was seen previously back in March and did have an intersubstance tear of the rotator cuff. Patient started doing exercises and did have an ultrasound guided injection. Patient states since then was doing very well until now. Having some discomfort mostly over the anterior part of the shoulder that seems to radiate down the arm somewhat. Denies any neck pain that is associated with this. Patient states though as a dull throbbing aching sensation that can keep him up at night.     Past medical history, social, surgical and family history all reviewed in electronic medical record.   Review of Systems: No headache, visual changes, nausea, vomiting, diarrhea, constipation, dizziness, abdominal pain, skin rash, fevers, chills, night sweats, weight loss, swollen lymph nodes, body aches, joint swelling, muscle aches, chest pain, shortness of breath, mood changes.   Objective Blood pressure 112/70, pulse 112, weight 381 lb (172.82 kg), SpO2 96 %.  General: No apparent distress alert and oriented x3 mood and affect normal, dressed appropriately. Morbidly obese HEENT: Pupils equal,  extraocular movements intact  Respiratory: Patient's speak in full sentences and does not appear short of breath  Cardiovascular: No lower extremity edema, non tender, no erythema  Skin: Warm dry intact with no signs of infection or rash on extremities or on axial skeleton.  Abdomen: Soft nontender  Neuro: Cranial nerves II through XII are intact, neurovascularly intact in all extremities with 2+ DTRs and 2+ pulses.  Lymph: No lymphadenopathy of posterior or anterior cervical chain or axillae bilaterally.  Gait normal with good balance and coordination.  MSK:  Non tender with full range of motion and good stability and symmetric strength and tone of shoulders, elbows, wrist, hip, knee and ankles bilaterally.  MSK US performed of: Right shoulder This study was ordered, performed, and interpreted by Charlann Boxer D.O.  Shoulder:  Supraspinatus: Appears on long and transverse views does have a healing of the previous intersubstance tear, no bursal bulge seen with shoulder abduction on impingement view. Patient does have impingement with lateral abduction secondary to patient's acromion. Infraspinatus: Appears normal on long and transverse views. Subscapularis: Appears normal on long and transverse views. Teres Minor: Appears normal on long and transverse views. AC joint: Capsule undistended, no geyser sign. Glenohumeral Joint: Appears normal without effusion. Glenoid Labrum: Intact without visualized tears. Biceps Tendon: Appears normal on long and transverse views, no fraying of tendon, tendon located in intertubercular groove, no subluxation with shoulder internal or external rotation. No increased power doppler signal.  Impression: Subacromial bursitis  Procedure: Real-time Ultrasound Guided Injection of right glenohumeral joint Device: GE Logiq E  Ultrasound guided injection is preferred based studies that show increased duration, increased effect, greater accuracy, decreased  procedural pain, increased response  rate with ultrasound guided versus blind injection.  Verbal informed consent obtained.  Time-out conducted.  Noted no overlying erythema, induration, or other signs of local infection.  Skin prepped in a sterile fashion.  Local anesthesia: Topical Ethyl chloride.  With sterile technique and under real time ultrasound guidance: Joint visualized. 23g 1  inch needle inserted posterior approach. Pictures taken for needle placement. Patient did have injection of 2 cc of 1% lidocaine, 2 cc of 0.5% Marcaine, and 1.0 cc of Kenalog 40 mg/dL. Completed without difficulty  Pain immediately resolved suggesting accurate placement of the medication.  Advised to call if fevers/chills, erythema, induration, drainage, or persistent bleeding.  Images permanently stored and available for review in the ultrasound unit.  Impression: Technically successful ultrasound guided injection. Back Exam:  Inspection: . 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 Patient does have spasming of the trapezius and rhomboids. Motion: Flexion 35 deg, Extension 35 deg, Side Bending to 45 deg bilaterally,  Rotation to 45 deg bilaterally  SLR laying: Negative  XSLR laying: Negative  Palpable tenderness: Significant decrease in tenderness FABER: negative. Sensory change: Gross sensation intact to all lumbar and sacral dermatomes.  Reflexes: 2+ at both patellar tendons, 2+ at achilles tendons, Babinski's downgoing.  Strength at foot  Plantar-flexion: 5/5 Dorsi-flexion: 5/5 Eversion: 5/5 Inversion: 5/5  Leg strength  Quad: 5/5 Hamstring: 5/5 Hip flexor: 5/5 Hip abductors: 4/5  Gait unremarkable.  Osteopathic findings Cervical Neutral Thoracic T3 extended rotated and side bent left with inhaled third rib T6 extended rotated and side bent right T9 extended rotated and side bent left Lumbar L2 flexed rotated and side bent right     Impression and  Recommendations:     This case required medical decision making of moderate complexity.

## 2014-01-10 NOTE — Assessment & Plan Note (Signed)
Will be chronic and likely multifactorial secondary to patient's body habitus. Patient encouraged to do more the exercises on areolar basis. We discussed icing regimen. We discussed continuing the medications as we've done previously. We encouraged patient to have better control of his blood sugar. I'm happy that his pain did resolve with the prednisone. We will try to avoid this due to his diabetes. Patient come back again in 4-6 weeks for further evaluation and treatment. Patient will continue with formal physical therapy.

## 2014-01-10 NOTE — Assessment & Plan Note (Signed)
Patient was given another injection today and tolerated the procedure very well. We discussed icing protocol. We discussed home exercises given start these regularly. Patient on ultrasound does show good healing of the rotator cuff tear and had more of a subacromial bursitis today. I expect him to do actively well.

## 2014-01-10 NOTE — Patient Instructions (Addendum)
Good to see you Lets get xrays of neck and back with you not improving.  Thanks for saving me :) I am glad you are doing better Enjoy christmas Ice shoulder in 6 hours.  See me again in 4-6 weeks

## 2014-01-26 ENCOUNTER — Ambulatory Visit (INDEPENDENT_AMBULATORY_CARE_PROVIDER_SITE_OTHER): Payer: Commercial Managed Care - PPO | Admitting: Medical

## 2014-01-26 ENCOUNTER — Encounter: Payer: Self-pay | Admitting: Medical

## 2014-01-26 VITALS — BP 125/80 | HR 110 | Temp 98.2°F | Ht 75.0 in | Wt 375.0 lb

## 2014-01-26 DIAGNOSIS — E1169 Type 2 diabetes mellitus with other specified complication: Secondary | ICD-10-CM | POA: Insufficient documentation

## 2014-01-26 DIAGNOSIS — R739 Hyperglycemia, unspecified: Secondary | ICD-10-CM

## 2014-01-26 DIAGNOSIS — E669 Obesity, unspecified: Secondary | ICD-10-CM

## 2014-01-26 DIAGNOSIS — E119 Type 2 diabetes mellitus without complications: Secondary | ICD-10-CM

## 2014-01-26 DIAGNOSIS — R7309 Other abnormal glucose: Secondary | ICD-10-CM

## 2014-01-26 HISTORY — DX: Type 2 diabetes mellitus with other specified complication: E11.69

## 2014-01-26 HISTORY — DX: Type 2 diabetes mellitus with other specified complication: E66.9

## 2014-01-26 LAB — GLUCOSE, POCT (MANUAL RESULT ENTRY): POC Glucose: 135 mg/dl — AB (ref 70–99)

## 2014-01-26 MED ORDER — METFORMIN HCL 500 MG PO TABS: 500.0000 mg | ORAL_TABLET | Freq: Two times a day (BID) | ORAL | Status: DC

## 2014-01-26 NOTE — Patient Instructions (Addendum)
I want you to increase metformin to 2 tab po bid and continue amaryl. I will refer to dietician. I want you to start exercising.   Check bs fasting am and 30 minutes to 1 hour after eating one time a day. If you feel bad also check your bs.  Follow up in 2 weeks or as needed.  Bs today in office was 130.

## 2014-01-26 NOTE — Progress Notes (Signed)
Subjective:    Patient ID: Colin Bennett, male    DOB: May 11, 1980, 34 y.o.   MRN: [id number]  HPI   Pt in with reported blood sugar elevation. His levels have been above 250 at times when he checks. This am after breakfast bs was 268. Pt a1-c in November was 8.8. Pt gfr qas good.  Pt states last couple of months his blood his blood sugar have increased.   His a1-c was in around 6 before.  Pt is taking 2 tabs metformin in am and 1 tab at night.  Pt exercises when he can. Pt states moderate compliance with diet. He states his blood sugar was 86 earlier today. His lowest blood sugar in past was 76. He is diabetic for 2 years.  Most of the time bs in  low 100-150.  Pt states right now he feels fine.  Pt states he has frequent urination, hungry all the time and thirsty a lot.   Last time he ate one hour ago. Bag crackers. This was in response to bs of 86.  Past Medical History  Diagnosis Date  . Kidney stones   . Diabetes mellitus   . Bipolar 1 disorder   . HTN (hypertension) 05/10/2012  . Preventative health care 05/14/2012  . Renal lithiasis 08/10/2012  . Bipolar disorder, unspecified 08/10/2012  . Obesity, unspecified 11/09/2012  . Pain, joint, shoulder, right 04/09/2013    History   Social History  . Marital Status: Married    Spouse Name: N/A    Number of Children: 0  . Years of Education: N/A   Occupational History  . Collateral recovery    Social History Main Topics  . Smoking status: Never Smoker   . Smokeless tobacco: Never Used  . Alcohol Use: No  . Drug Use: No  . Sexual Activity: Not on file   Other Topics Concern  . Not on file   Social History Narrative    No past surgical history on file.  Family History  Problem Relation Age of Onset  . Hypertension Other   . Diabetes Other     No Known Allergies  Current Outpatient Prescriptions on File Prior to Visit  Medication Sig Dispense Refill  . albuterol (PROVENTIL HFA;VENTOLIN HFA) 108 (90  BASE) MCG/ACT inhaler Inhale 2 puffs into the lungs every 6 (six) hours as needed for wheezing or shortness of breath. 1 Inhaler 0  . amLODipine (NORVASC) 5 MG tablet Take 1 tablet (5 mg total) by mouth daily. 90 tablet 1  . baclofen (LIORESAL) 10 MG tablet Take 1 tablet (10 mg total) by mouth 3 (three) times daily. 30 each 0  . glimepiride (AMARYL) 4 MG tablet Take 1 tablet (4 mg total) by mouth daily with breakfast. 90 tablet 1  . lisinopril (PRINIVIL,ZESTRIL) 10 MG tablet Take 1 tablet (10 mg total) by mouth daily. 90 tablet 0  . metFORMIN (GLUCOPHAGE) 500 MG tablet Take 1 tablet (500 mg total) by mouth as directed. Take 1 tab po bid X 7 days, then increase to 2 tabs in AM and 1 tab in PM 90 tablet 3  . mupirocin ointment (BACTROBAN) 2 % Place 1 application into the nose daily as needed. Epistaxis/dry nose 22 g 1  . pravastatin (PRAVACHOL) 10 MG tablet Take 1 tablet (10 mg total) by mouth daily. 90 tablet 1  . predniSONE (DELTASONE) 50 MG tablet Take 1 tablet (50 mg total) by mouth daily. 5 tablet 0  . meloxicam (MOBIC) 15  MG tablet Take 1 tablet (15 mg total) by mouth daily. (Patient not taking: Reported on 01/26/2014) 30 tablet 1  . naproxen sodium (ANAPROX) 550 MG tablet Take 1 tablet (550 mg total) by mouth 2 (two) times daily with a meal. (Patient not taking: Reported on 01/26/2014) 60 tablet 0  . oxyCODONE-acetaminophen (PERCOCET/ROXICET) 5-325 MG per tablet Take 2 tablets by mouth every 4 (four) hours as needed. (Patient not taking: Reported on 01/26/2014) 12 tablet 0   No current facility-administered medications on file prior to visit.    BP 125/80 mmHg  Pulse 110  Temp(Src) 98.2 F (36.8 C) (Oral)  Ht 6\' 3"  (1.905 m)  Wt 375 lb (170.099 kg)  BMI 46.87 kg/m2  SpO2 94%       Review of Systems  Constitutional: Negative for fever, chills and fatigue.  HENT: Negative.   Respiratory: Negative for cough, chest tightness, shortness of breath and wheezing.   Cardiovascular: Negative  for chest pain and palpitations.  Gastrointestinal: Negative for nausea, vomiting, abdominal pain, constipation and anal bleeding.       None now but some transient on and off.  tranansient abdominal pain seconds- 1 minutes lt and rt abdomen when bends over at times to grab things then resolved. But not constant.   Endocrine: Negative for polydipsia, polyphagia and polyuria.  Musculoskeletal: Negative for back pain.  Neurological: Negative for dizziness, weakness and light-headedness.  Hematological: Negative for adenopathy. Does not bruise/bleed easily.  Psychiatric/Behavioral: Negative for behavioral problems.       Objective:   Physical Exam   General  Mental Status - Alert. General Appearance - Well groomed. Not in acute distress.  Skin Rashes- No Rashes.  HEENT Head- Normal. Ear Auditory Canal - Left- Normal. Right - Normal.Tympanic Membrane- Left- Normal. Right- Normal. Eye Sclera/Conjunctiva- Left- Normal. Right- Normal. Nose & Sinuses Nasal Mucosa- Left-  Not boggy or Congested. Right-  Not  boggy or Congested. Mouth & Throat Lips: Upper Lip- Normal: no dryness, cracking, pallor, cyanosis, or vesicular eruption. Lower Lip-Normal: no dryness, cracking, pallor, cyanosis or vesicular eruption. Buccal Mucosa- Bilateral- No Aphthous ulcers. Oropharynx- No Discharge or Erythema. Tonsils: Characteristics- Bilateral- No Erythema or Congestion. Size/Enlargement- Bilateral- No enlargement. Discharge- bilateral-None.  Neck Neck- Supple. No Masses.   Chest and Lung Exam Auscultation: Breath Sounds:- even and unlabored, but bilateral upper lobe rhonchi.  Cardiovascular Auscultation:Rythm- Regular, rate and rhythm. Murmurs & Other Heart Sounds:Ausculatation of the heart reveal- No Murmurs.  Lymphatic Head & Neck General Head & Neck Lymphatics: Bilateral: Description- No Localized lymphadenopathy.  General Appearance- Not in acute distress.  HEENT Eyes-  Scleraeral/Conjuntiva-bilat- Not Yellow. Mouth & Throat- Normal.  Chest and Lung Exam Auscultation: Breath sounds:-Normal. Adventitious sounds:- No Adventitious sounds.  Cardiovascular Auscultation:Rythm - Regular. Heart Sounds -Normal heart sounds.  Abdomen Inspection:-Inspection Normal.  Palpation/Perucssion: Palpation and Percussion of the abdomen reveal- faint minimal flank  Tender(he did not mention this during initial interview, found incidentaly), No Rebound tenderness, No rigidity(Guarding) and No Palpable abdominal masses.  Liver:-Normal.  Spleen:- Normal.   Back- no cva tenderness. . .          Assessment & Plan:

## 2014-01-26 NOTE — Progress Notes (Signed)
Pre visit review using our clinic review tool, if applicable. No additional management support is needed unless otherwise documented below in the visit note. 

## 2014-01-26 NOTE — Assessment & Plan Note (Signed)
I want you to increase metformin to 2 tab po bid and continue amaryl. I will refer to dietician. I want you to start exercising.   Check bs fasting am and 30 minutes to 1 hour after eating one time a day. If you feel bad also check your bs.  Follow up in 2 weeks or as needed.  Please log book with your bs readings.

## 2014-01-26 NOTE — Progress Notes (Deleted)
before

## 2014-02-15 ENCOUNTER — Ambulatory Visit (INDEPENDENT_AMBULATORY_CARE_PROVIDER_SITE_OTHER): Payer: Commercial Managed Care - PPO | Admitting: Family Medicine

## 2014-02-15 ENCOUNTER — Encounter: Payer: Self-pay | Admitting: Family Medicine

## 2014-02-15 ENCOUNTER — Ambulatory Visit: Payer: Commercial Managed Care - PPO | Admitting: Family Medicine

## 2014-02-15 VITALS — BP 104/71 | HR 90 | Temp 98.4°F | Ht 75.0 in | Wt 383.6 lb

## 2014-02-15 DIAGNOSIS — I10 Essential (primary) hypertension: Secondary | ICD-10-CM

## 2014-02-15 DIAGNOSIS — E119 Type 2 diabetes mellitus without complications: Secondary | ICD-10-CM

## 2014-02-15 DIAGNOSIS — E785 Hyperlipidemia, unspecified: Secondary | ICD-10-CM

## 2014-02-15 DIAGNOSIS — E1110 Type 2 diabetes mellitus with ketoacidosis without coma: Secondary | ICD-10-CM

## 2014-02-15 DIAGNOSIS — E1169 Type 2 diabetes mellitus with other specified complication: Secondary | ICD-10-CM

## 2014-02-15 DIAGNOSIS — E1310 Other specified diabetes mellitus with ketoacidosis without coma: Secondary | ICD-10-CM

## 2014-02-15 DIAGNOSIS — G4733 Obstructive sleep apnea (adult) (pediatric): Secondary | ICD-10-CM

## 2014-02-15 DIAGNOSIS — E669 Obesity, unspecified: Secondary | ICD-10-CM

## 2014-02-15 DIAGNOSIS — M546 Pain in thoracic spine: Secondary | ICD-10-CM

## 2014-02-15 MED ORDER — LISINOPRIL 10 MG PO TABS: 10.0000 mg | ORAL_TABLET | Freq: Every day | ORAL | Status: DC

## 2014-02-15 MED ORDER — PRAVASTATIN SODIUM 10 MG PO TABS: 10.0000 mg | ORAL_TABLET | Freq: Every day | ORAL | Status: DC

## 2014-02-15 MED ORDER — GLIMEPIRIDE 4 MG PO TABS: 4.0000 mg | ORAL_TABLET | Freq: Every day | ORAL | Status: DC

## 2014-02-15 NOTE — Assessment & Plan Note (Signed)
Well controlled, no changes to meds. Encouraged heart healthy diet such as the DASH diet and exercise as tolerated.  °

## 2014-02-15 NOTE — Assessment & Plan Note (Addendum)
Last A1C at 8.8, patient continues to eat erratically needs small, frequent meals with lean proteins and complex carbs if we hope to control his sugars more completely

## 2014-02-15 NOTE — Progress Notes (Signed)
Pre visit review using our clinic review tool, if applicable. No additional management support is needed unless otherwise documented below in the visit note. 

## 2014-02-15 NOTE — Patient Instructions (Signed)

## 2014-02-18 ENCOUNTER — Encounter: Payer: Self-pay | Admitting: Family Medicine

## 2014-02-18 NOTE — Assessment & Plan Note (Signed)
Tolerating statin, encouraged heart healthy diet, avoid trans fats, minimize simple carbs and saturated fats. Increase exercise as tolerated 

## 2014-02-18 NOTE — Assessment & Plan Note (Signed)
Allowed muscle relaxer prn, continue movement and heat to keep the back moving

## 2014-02-18 NOTE — Assessment & Plan Note (Signed)
Using CPAP 

## 2014-02-18 NOTE — Assessment & Plan Note (Signed)
Encouraged DASH diet, decrease po intake and increase exercise as tolerated. Needs 7-8 hours of sleep nightly. Avoid trans fats, eat small, frequent meals every 4-5 hours with lean proteins, complex carbs and healthy fats. Minimize simple carbs, 

## 2014-02-18 NOTE — Progress Notes (Signed)
Patient ID: MINOR IDEN, male   DOB: [birth year], 34 y.o.   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 Follow-up    2 weeks    HPI  Patient is a 34 y.o. male in today for routine medical care. Patient in today to review sugar, he continues to skip meals while he is working. He has a very erratic work schedule. He reports his sugar had dropeed to low so he dropped his Metformin to once daily from twice. Sugars this week ranged from 79 to 200. No acute illness. Denies CP/palp/SOB/HA/congestion/fevers/GI or GU c/o. Taking meds as prescribed  Past Medical History  Diagnosis Date  . Kidney stones   . Diabetes mellitus   . Bipolar 1 disorder   . HTN (hypertension) 05/10/2012  . Preventative health care 05/14/2012  . Renal lithiasis 08/10/2012  . Bipolar disorder, unspecified 08/10/2012  . Obesity, unspecified 11/09/2012  . Pain, joint, shoulder, right 04/09/2013  . Diabetes mellitus type 2 in obese 01/26/2014    History reviewed. No pertinent past surgical history.  Family History  Problem Relation Age of Onset  . Hypertension Other   . Diabetes Other     History   Social History  . Marital Status: Married    Spouse Name: N/A    Number of Children: 0  . Years of Education: N/A   Occupational History  . Collateral recovery    Social History Main Topics  . Smoking status: Never Smoker   . Smokeless tobacco: Never Used  . Alcohol Use: No  . Drug Use: No  . Sexual Activity: Not on file   Other Topics Concern  . Not on file   Social History Narrative    Current Outpatient Prescriptions on File Prior to Visit  Medication Sig Dispense Refill  . albuterol (PROVENTIL HFA;VENTOLIN HFA) 108 (90 BASE) MCG/ACT inhaler Inhale 2 puffs into the lungs every 6 (six) hours as needed for wheezing or shortness of breath. 1 Inhaler 0  . amLODipine (NORVASC) 5 MG  tablet Take 1 tablet (5 mg total) by mouth daily. 90 tablet 1  . baclofen (LIORESAL) 10 MG tablet Take 1 tablet (10 mg total) by mouth 3 (three) times daily. 30 each 0  . metFORMIN (GLUCOPHAGE) 500 MG tablet Take 1 tablet (500 mg total) by mouth 2 (two) times daily with a meal. (Patient taking differently: Take 2 tablets by mouth 2 times daily with meals.) 180 tablet 3  . mupirocin ointment (BACTROBAN) 2 % Place 1 application into the nose daily as needed. Epistaxis/dry nose 22 g 1   No current facility-administered medications on file prior to visit.    No Known Allergies  Review of Systems  Review of Systems  Constitutional: Positive for malaise/fatigue. Negative for fever.  HENT: Negative for congestion.   Eyes: Negative for discharge.  Respiratory: Negative for shortness of breath.   Cardiovascular: Negative for chest pain, palpitations and leg swelling.  Gastrointestinal: Negative for nausea, abdominal pain and diarrhea.  Genitourinary: Negative for dysuria.  Musculoskeletal: Negative for falls.  Skin: Negative for rash.  Neurological: Negative for loss of consciousness and headaches.  Endo/Heme/Allergies: Negative for polydipsia.  Psychiatric/Behavioral: Negative for depression and suicidal ideas. The patient is not nervous/anxious and does not have insomnia.     Objective  BP 104/71 mmHg  Pulse 90  Temp(Src) 98.4 F (  36.9 C) (Oral)  Ht 6\' 3"  (1.905 m)  Wt 383 lb 9.6 oz (174 kg)  BMI 47.95 kg/m2  SpO2 95%  Physical Exam  Physical Exam  Constitutional: He is oriented to person, place, and time and well-developed, well-nourished, and in no distress. No distress.  HENT:  Head: Normocephalic and atraumatic.  Eyes: Conjunctivae are normal.  Neck: Neck supple. No thyromegaly present.  Cardiovascular: Normal rate, regular rhythm and normal heart sounds.   No murmur heard. Pulmonary/Chest: Effort normal and breath sounds normal. No respiratory distress.  Abdominal: He  exhibits no distension and no mass. There is no tenderness.  Musculoskeletal: He exhibits no edema.  Neurological: He is alert and oriented to person, place, and time.  Skin: Skin is warm.  Psychiatric: Memory, affect and judgment normal.    Lab Results  Component Value Date   TSH 0.72 12/04/2013   Lab Results  Component Value Date   WBC 11.5* 12/04/2013   HGB 16.6 12/04/2013   HCT 49.6 12/04/2013   MCV 86.9 12/04/2013   PLT 274.0 12/04/2013   Lab Results  Component Value Date   CREATININE 0.8 12/04/2013   BUN 12 12/04/2013   NA 142 12/04/2013   K 4.6 12/04/2013   CL 107 12/04/2013   CO2 17* 12/04/2013   Lab Results  Component Value Date   ALT 61* 12/04/2013   AST 80* 12/04/2013   ALKPHOS 80 12/04/2013   BILITOT 0.5 12/04/2013   Lab Results  Component Value Date   CHOL 188 12/04/2013   Lab Results  Component Value Date   HDL 42.40 12/04/2013   Lab Results  Component Value Date   LDLCALC 118* 12/04/2013   Lab Results  Component Value Date   TRIG 139.0 12/04/2013   Lab Results  Component Value Date   CHOLHDL 4 12/04/2013     Assessment & Plan  Diabetes mellitus type 2 in obese Last A1C at 8.8, patient continues to eat erratically needs small, frequent meals with lean proteins and complex carbs if we hope to control his sugars more completely   HTN (hypertension) Well controlled, no changes to meds. Encouraged heart healthy diet such as the DASH diet and exercise as tolerated.    OSA (obstructive sleep apnea) Using CPAP   Obesity Encouraged DASH diet, decrease po intake and increase exercise as tolerated. Needs 7-8 hours of sleep nightly. Avoid trans fats, eat small, frequent meals every 4-5 hours with lean proteins, complex carbs and healthy fats. Minimize simple carbs,    Hyperlipidemia Tolerating statin, encouraged heart healthy diet, avoid trans fats, minimize simple carbs and saturated fats. Increase exercise as tolerated   Back  pain Allowed muscle relaxer prn, continue movement and heat to keep the back moving

## 2014-02-19 ENCOUNTER — Telehealth: Payer: Self-pay | Admitting: Pulmonary Disease

## 2014-02-19 NOTE — Telephone Encounter (Signed)
Spoke with Janett Billow and pt can see TP for this. OV has been made with pt's wife to see TP on 02/27/14 at 10:30am. Advised pt's wife that we can take care of what he needs but pt will need to see RA soon since he has not seen him since 2014. She agreed and verbalized understanding.

## 2014-02-27 ENCOUNTER — Encounter: Payer: Self-pay | Admitting: Adult Health

## 2014-02-27 ENCOUNTER — Ambulatory Visit (INDEPENDENT_AMBULATORY_CARE_PROVIDER_SITE_OTHER): Payer: Commercial Managed Care - PPO | Admitting: Adult Health

## 2014-02-27 VITALS — BP 122/84 | HR 89 | Temp 97.9°F | Ht 74.0 in | Wt 384.2 lb

## 2014-02-27 DIAGNOSIS — G4733 Obstructive sleep apnea (adult) (pediatric): Secondary | ICD-10-CM

## 2014-02-27 DIAGNOSIS — E669 Obesity, unspecified: Secondary | ICD-10-CM

## 2014-02-27 NOTE — Assessment & Plan Note (Signed)
Continue to work on weight loss 

## 2014-02-27 NOTE — Patient Instructions (Signed)
Continue on CPAP At bedtime  .  Keep up good work wearing CPAP .  Order sent to DME for mask supplies  Follow up Dr. Elsworth Soho  In 1 year and As needed

## 2014-02-27 NOTE — Progress Notes (Signed)
  Subjective:    Patient ID: Colin Bennett, male    DOB: 04-19-1980, 34 y.o.   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  Epworth sleepiness score is 16/24 . During a recent hospitalization and 713 for renal calculi nurses noted drop in oxygen saturation.  He gained 40 pounds in the last 2 years his current weight of 349 pounds. Not seem to have defined shifts at work. He may work from 7 PM to noon the next day and that 3-4 hours of sleep in his truck. Thus, his bedtime and wake up time varies. Sleep latency is minimal, he has 2- 3 awakenings including nocturia and gasping episodes. Wakes up about 6 hours after going to bed still feeling tired, with a dry mouth but denies headaches.   PSG showed mod OSA- AHI 17/h, nadir desatn 77%, corrected by CPAP 16 cm     03/28/12  Download 1/14 - on cpap 16 cm - no residuals, leak ok with beard - CPAP very effective 3/14 Download on CPAP 16 cm - no residuals  Much improved Currently using CPAP every night for 4-5 hours. Reports problems with the mask leaking. Denies problems with the machine or pressure. Mask ok, pressure ok  He feels sleepy if he uses CPAP > 6h  02/27/2014 Follow up OSA  Pt returns for a annual OSA follow up  Wears  CPAP every ngiht, for at least 5-6 hrs.  If he takes a nap he wears his CPAP .  Feels he is improved with CPAP w/ less daytime sleepiness/fatigue.  Discussed not driving when sleepy.  His DME is [facility]. Needs new mask supplies. -order sent.  Download last 90 days shows good compliance with avg usage 5.5 hr  Good control with minimal leaks.  Discussed wt loss.     Review of Systems neg for any significant sore throat, dysphagia, itching, sneezing, nasal congestion or excess/ purulent secretions, fever, chills, sweats, unintended wt loss, pleuritic or exertional cp, hempoptysis, orthopnea pnd or change in chronic leg swelling. Also denies presyncope, palpitations, heartburn,  abdominal pain, nausea, vomiting, diarrhea or change in bowel or urinary habits, dysuria,hematuria, rash, arthralgias, visual complaints, headache, numbness weakness or ataxia.     Objective:   Physical Exam  Gen. Pleasant, obese, in no distress ENT - no lesions, no post nasal drip Neck: No JVD, no thyromegaly, no carotid bruits Lungs: no use of accessory muscles, no dullness to percussion, decreased without rales or rhonchi  Cardiovascular: Rhythm regular, heart sounds  normal, no murmurs or gallops, no peripheral edema Musculoskeletal: No deformities, no cyanosis or clubbing , no tremors        Assessment & Plan:

## 2014-02-27 NOTE — Assessment & Plan Note (Signed)
Doing well on CPAP   Plan  Continue on CPAP At bedtime  .  Keep up good work wearing CPAP .  Order sent to DME for mask supplies  Follow up Dr. Elsworth Soho  In 1 year and As needed

## 2014-02-28 NOTE — Addendum Note (Signed)
Addended by: Parke Poisson E on: 02/28/2014 10:56 AM   Modules accepted: Orders

## 2014-03-01 NOTE — Progress Notes (Signed)
Reviewed & agree with plan  

## 2014-03-15 ENCOUNTER — Telehealth: Payer: Self-pay | Admitting: Adult Health

## 2014-03-15 ENCOUNTER — Telehealth: Payer: Self-pay | Admitting: Pulmonary Disease

## 2014-03-15 NOTE — Telephone Encounter (Signed)
Spoke with pt, states that [facility] is stating they still dont have his order.    Spoke with Daphne at [facility], states she has an order for mask refit and supplies.  Patient just needs to call [facility] to schedule his mask refit.    Spoke with pt and relayed this to him.  Nothing further needed.

## 2014-03-15 NOTE — Telephone Encounter (Signed)
lmtcb X1 for pt  

## 2014-03-15 NOTE — Telephone Encounter (Signed)
Spoke with pt, checking on the status of cpap supplies order that was placed on 2/10.    Called [facility], states order is on file and ready for pickup at [facility]. Location.    [address] location # [phone]- ext [phone], [phone]  Spoke with pt, he is aware and has numbers.  Nothing further needed.

## 2014-03-15 NOTE — Telephone Encounter (Signed)
Pt calling back again -[phone] (H)

## 2014-03-30 ENCOUNTER — Encounter: Payer: Self-pay | Admitting: Adult Health

## 2014-04-16 ENCOUNTER — Telehealth: Payer: Self-pay | Admitting: Pulmonary Disease

## 2014-04-16 ENCOUNTER — Telehealth: Payer: Self-pay | Admitting: Family Medicine

## 2014-04-16 NOTE — Telephone Encounter (Signed)
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 Please fax to [phone]

## 2014-04-16 NOTE — Telephone Encounter (Signed)
Needing last office notes showing that he is complient on sleep study.[facility]

## 2014-04-16 NOTE — Telephone Encounter (Signed)
Faxed lab results to [phone] and patient informed

## 2014-04-16 NOTE — Telephone Encounter (Signed)
Called spoke with Olivia Mackie from [facility]. She reports she has received what she needs and needed nothing further. She reports someone had already faxed this over. Will sign off

## 2014-04-16 NOTE — Telephone Encounter (Signed)
Pt calling back about results needs them for dot physcical  [phone] Colin Bennett

## 2014-05-07 ENCOUNTER — Ambulatory Visit (INDEPENDENT_AMBULATORY_CARE_PROVIDER_SITE_OTHER): Payer: Commercial Managed Care - PPO | Admitting: Family Medicine

## 2014-05-07 ENCOUNTER — Encounter: Payer: Self-pay | Admitting: Family Medicine

## 2014-05-07 ENCOUNTER — Other Ambulatory Visit (INDEPENDENT_AMBULATORY_CARE_PROVIDER_SITE_OTHER): Payer: Commercial Managed Care - PPO

## 2014-05-07 VITALS — BP 122/82 | HR 96 | Ht 74.0 in | Wt 383.0 lb

## 2014-05-07 DIAGNOSIS — G2589 Other specified extrapyramidal and movement disorders: Secondary | ICD-10-CM

## 2014-05-07 DIAGNOSIS — M9902 Segmental and somatic dysfunction of thoracic region: Secondary | ICD-10-CM

## 2014-05-07 DIAGNOSIS — M7541 Impingement syndrome of right shoulder: Secondary | ICD-10-CM | POA: Diagnosis not present

## 2014-05-07 DIAGNOSIS — M25511 Pain in right shoulder: Secondary | ICD-10-CM

## 2014-05-07 DIAGNOSIS — M999 Biomechanical lesion, unspecified: Secondary | ICD-10-CM

## 2014-05-07 NOTE — Patient Instructions (Signed)
Good to see you Tried another injection in the shoulder Ice is your friend Start the exercises regularly Cal me in 2 weeks and tell me how you are doing if worsening pain then we will consider MRI Otherwise see me again in 3-4 weeks.

## 2014-05-07 NOTE — Assessment & Plan Note (Signed)
Patient another injection today and tolerated the procedure very well. We discussed icing and home exercises. We discussed continuing this on a regular basis. We discussed repeating formal physical therapy but patient thinks he can do the exercises on his own. Patient and will come back and see me again in 3 weeks. If any worsening symptoms patient will call and we'll consider an MR arthrogram to rule out labral tear or worsening rotator cuff pathology.

## 2014-05-07 NOTE — Progress Notes (Signed)
Pre visit review using our clinic review tool, if applicable. No additional management support is needed unless otherwise documented below in the visit note. 

## 2014-05-07 NOTE — Progress Notes (Signed)
[facility] [address] [place], [place] [address] Phone: [phone] Subjective:    CC: Back painfollow-up, right shoulder pain follow up  PTW:[id number] Colin Bennett is a 34 y.o. male coming in with complaint of back pain. 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 Continues to respond well to home exercises.  Patient has had right shoulder pain previously as well. Patient did respond very well to a injection but unfortunate the pain is started to come back. Patient was found to have a small rotator cuff tear and subacromial bursitis. Patient states he was seen by primary care provider who did get x-rays previously. Patient did not have any bony abnormality some this was reviewed by me today. Patient states was doing good until the following several weeks. Patient states that the same pain seems to be coming again. More on the anterior aspect of the arm. Patient states it is more of a dull aching pain. Seems to be getting worse with the activities.     Past medical history, social, surgical and family history all reviewed in electronic medical record.   Review of Systems: No headache, visual changes, nausea, vomiting, diarrhea, constipation, dizziness, abdominal pain, skin rash, fevers, chills, night sweats, weight loss, swollen lymph nodes, body aches, joint swelling, muscle aches, chest pain, shortness of breath, mood changes.   Objective Blood pressure 122/82, pulse 96, height 6\' 2"  (1.88 m), weight 383 lb (173.728 kg), SpO2 96 %.  General: No apparent distress alert and oriented x3 mood and affect normal, dressed appropriately. Morbidly obese HEENT: Pupils equal, extraocular movements intact  Respiratory: Patient's speak in full sentences and does not appear short of breath  Cardiovascular: No lower extremity edema, non tender, no erythema  Skin: Warm dry intact with no signs of infection or rash on  extremities or on axial skeleton.  Abdomen: Soft nontender  Neuro: Cranial nerves II through XII are intact, neurovascularly intact in all extremities with 2+ DTRs and 2+ pulses.  Lymph: No lymphadenopathy of posterior or anterior cervical chain or axillae bilaterally.  Gait normal with good balance and coordination.  MSK:  Non tender with full range of motion and good stability and symmetric strength and tone of shoulders, elbows, wrist, hip, knee and ankles bilaterally.  Patient continues to have impingement syndrome of the right shoulder. Neurovascular intact with full rotator cuff strength. Full range of motion noted.   Back Exam:  Inspection: . 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 Patient does have spasming of the trapezius and rhomboids. Motion: Flexion 35 deg, Extension 35 deg, Side Bending to 45 deg bilaterally,  Rotation to 45 deg bilaterally  SLR laying: Negative  XSLR laying: Negative  Palpable tenderness: Significant decrease in tenderness FABER: negative. Sensory change: Gross sensation intact to all lumbar and sacral dermatomes.  Reflexes: 2+ at both patellar tendons, 2+ at achilles tendons, Babinski's downgoing.  Strength at foot  Plantar-flexion: 5/5 Dorsi-flexion: 5/5 Eversion: 5/5 Inversion: 5/5  Leg strength  Quad: 5/5 Hamstring: 5/5 Hip flexor: 5/5 Hip abductors: 4/5  Gait unremarkable.  Osteopathic findings Cervical Neutral Thoracic T3 extended rotated and side bent left with inhaled third rib T6 extended rotated and side bent right T9 extended rotated and side bent left Lumbar L2 flexed rotated and side bent right  Procedure: Real-time Ultrasound Guided Injection of right glenohumeral joint Device: GE Logiq E  Ultrasound guided injection is preferred based studies that show increased duration,  increased effect, greater accuracy, decreased procedural pain, increased response rate with ultrasound guided versus blind injection.   Verbal informed consent obtained.  Time-out conducted.  Noted no overlying erythema, induration, or other signs of local infection.  Skin prepped in a sterile fashion.  Local anesthesia: Topical Ethyl chloride.  With sterile technique and under real time ultrasound guidance: Joint visualized. 23g 1  inch needle inserted posterior approach. Pictures taken for needle placement. Patient did have injection of 2 cc of 1% lidocaine, 2 cc of 0.5% Marcaine, and 1.0 cc of Kenalog 40 mg/dL. Completed without difficulty  Pain immediately resolved suggesting accurate placement of the medication.  Advised to call if fevers/chills, erythema, induration, drainage, or persistent bleeding.  Images permanently stored and available for review in the ultrasound unit.  Impression: Technically successful ultrasound guided injection.    Impression and Recommendations:     This case required medical decision making of moderate complexity.

## 2014-05-07 NOTE — Assessment & Plan Note (Signed)
Patient is having some worsening of the posture. We discussed more of the postural exercises that was beneficial. We discussed topical anti-inflammatories. Patient come back and see me again in 3-4 weeks.

## 2014-05-07 NOTE — Assessment & Plan Note (Signed)
Decision today to treat with OMT was based on Physical Exam  After verbal consent patient was treated with HVLA, ME, FPR techniques in thoracic, rib,, lumbar areas  Patient tolerated the procedure well with improvement in symptoms  Patient given exercises, stretches and lifestyle modifications  See medications in patient instructions if given  Patient will follow up in 4-6 week

## 2014-05-08 ENCOUNTER — Telehealth: Payer: Self-pay | Admitting: Family Medicine

## 2014-05-08 ENCOUNTER — Other Ambulatory Visit (INDEPENDENT_AMBULATORY_CARE_PROVIDER_SITE_OTHER): Payer: Commercial Managed Care - PPO

## 2014-05-08 ENCOUNTER — Other Ambulatory Visit: Payer: Self-pay | Admitting: Family Medicine

## 2014-05-08 DIAGNOSIS — E785 Hyperlipidemia, unspecified: Secondary | ICD-10-CM | POA: Diagnosis not present

## 2014-05-08 DIAGNOSIS — E119 Type 2 diabetes mellitus without complications: Secondary | ICD-10-CM

## 2014-05-08 DIAGNOSIS — E669 Obesity, unspecified: Secondary | ICD-10-CM | POA: Diagnosis not present

## 2014-05-08 DIAGNOSIS — I10 Essential (primary) hypertension: Secondary | ICD-10-CM

## 2014-05-08 DIAGNOSIS — E1169 Type 2 diabetes mellitus with other specified complication: Secondary | ICD-10-CM

## 2014-05-08 LAB — COMPREHENSIVE METABOLIC PANEL
ALT: 41 U/L (ref 0–53)
AST: 29 U/L (ref 0–37)
Albumin: 3.9 g/dL (ref 3.5–5.2)
Alkaline Phosphatase: 86 U/L (ref 39–117)
BUN: 11 mg/dL (ref 6–23)
CO2: 25 meq/L (ref 19–32)
Calcium: 9.8 mg/dL (ref 8.4–10.5)
Chloride: 100 mEq/L (ref 96–112)
Creatinine, Ser: 0.72 mg/dL (ref 0.40–1.50)
GFR: 132.09 mL/min (ref >60.00)
Glucose, Bld: 229 mg/dL — ABNORMAL HIGH (ref 70–99)
Potassium: 4 mEq/L (ref 3.5–5.1)
SODIUM: 133 meq/L — AB (ref 135–145)
TOTAL PROTEIN: 7.4 g/dL (ref 6.0–8.3)
Total Bilirubin: 0.4 mg/dL (ref 0.2–1.2)

## 2014-05-08 LAB — CBC WITH DIFFERENTIAL/PLATELET
BASOS ABS: 0 10*3/uL (ref 0.0–0.1)
Basophils Relative: 0.2 % (ref 0.0–3.0)
Eosinophils Absolute: 0 10*3/uL (ref 0.0–0.7)
Eosinophils Relative: 0.1 % (ref 0.0–5.0)
HCT: 47.1 % (ref 39.0–52.0)
HEMOGLOBIN: 16.3 g/dL (ref 13.0–17.0)
LYMPHS ABS: 1.5 10*3/uL (ref 0.7–4.0)
Lymphocytes Relative: 13.3 % (ref 12.0–46.0)
MCHC: 34.6 g/dL (ref 30.0–36.0)
MCV: 85.7 fl (ref 78.0–100.0)
MONO ABS: 0.5 10*3/uL (ref 0.1–1.0)
Monocytes Relative: 4.1 % (ref 3.0–12.0)
NEUTROS PCT: 82.3 % — AB (ref 43.0–77.0)
Neutro Abs: 9.5 10*3/uL — ABNORMAL HIGH (ref 1.4–7.7)
Platelets: 264 10*3/uL (ref 150.0–400.0)
RBC: 5.5 Mil/uL (ref 4.22–5.81)
RDW: 13.3 % (ref 11.5–15.5)
WBC: 11.5 10*3/uL — AB (ref 4.0–10.5)

## 2014-05-08 LAB — LIPID PANEL
Cholesterol: 168 mg/dL (ref 0–200)
HDL: 38 mg/dL — ABNORMAL LOW (ref >39.00)
LDL Cholesterol: 105 mg/dL — ABNORMAL HIGH (ref 0–99)
NonHDL: 130
TRIGLYCERIDES: 123 mg/dL (ref 0.0–149.0)
Total CHOL/HDL Ratio: 4
VLDL: 24.6 mg/dL (ref 0.0–40.0)

## 2014-05-08 LAB — TSH: TSH: 0.81 u[IU]/mL (ref 0.35–4.50)

## 2014-05-08 LAB — HEMOGLOBIN A1C: HEMOGLOBIN A1C: 8.9 % — AB (ref 4.6–6.5)

## 2014-05-08 MED ORDER — GLIMEPIRIDE 4 MG PO TABS: 4.0000 mg | ORAL_TABLET | Freq: Two times a day (BID) | ORAL | Status: DC

## 2014-05-08 NOTE — Telephone Encounter (Signed)
Patient wanted to inform he had a steroid shot in his shoulder from Dr Tamala Julian on 05/07/14 and did they make a difference on his lab results.

## 2014-05-08 NOTE — Telephone Encounter (Signed)
Not significantly the sugar number is an average of 3 months, one day would not matter much

## 2014-05-09 ENCOUNTER — Telehealth: Payer: Self-pay | Admitting: Family Medicine

## 2014-05-09 NOTE — Telephone Encounter (Signed)
Pre Visit letter sent  °

## 2014-05-10 NOTE — Telephone Encounter (Signed)
Patient informed. 

## 2014-05-30 ENCOUNTER — Encounter: Payer: Self-pay | Admitting: *Deleted

## 2014-05-30 ENCOUNTER — Telehealth: Payer: Self-pay | Admitting: *Deleted

## 2014-05-30 NOTE — Telephone Encounter (Signed)
Pre-Visit Call completed with patient and chart updated.   Pre-Visit Info documented in Specialty Comments under [employer].    

## 2014-05-31 ENCOUNTER — Ambulatory Visit (HOSPITAL_BASED_OUTPATIENT_CLINIC_OR_DEPARTMENT_OTHER)
Admission: RE | Admit: 2014-05-31 | Discharge: 2014-05-31 | Disposition: A | Discharge status: Home / Self Care | Payer: Commercial Managed Care - PPO | Source: Ambulatory Visit | Attending: Family Medicine | Admitting: Family Medicine

## 2014-05-31 ENCOUNTER — Encounter: Payer: Self-pay | Admitting: Family Medicine

## 2014-05-31 ENCOUNTER — Ambulatory Visit (INDEPENDENT_AMBULATORY_CARE_PROVIDER_SITE_OTHER): Payer: Commercial Managed Care - PPO | Admitting: Family Medicine

## 2014-05-31 ENCOUNTER — Telehealth: Payer: Self-pay | Admitting: Family Medicine

## 2014-05-31 VITALS — BP 112/72 | HR 92 | Temp 98.4°F | Ht 74.0 in | Wt 380.0 lb

## 2014-05-31 DIAGNOSIS — M545 Low back pain: Secondary | ICD-10-CM | POA: Insufficient documentation

## 2014-05-31 DIAGNOSIS — E669 Obesity, unspecified: Secondary | ICD-10-CM

## 2014-05-31 DIAGNOSIS — Z0000 Encounter for general adult medical examination without abnormal findings: Secondary | ICD-10-CM

## 2014-05-31 DIAGNOSIS — E1169 Type 2 diabetes mellitus with other specified complication: Secondary | ICD-10-CM

## 2014-05-31 DIAGNOSIS — N200 Calculus of kidney: Secondary | ICD-10-CM

## 2014-05-31 DIAGNOSIS — M546 Pain in thoracic spine: Secondary | ICD-10-CM

## 2014-05-31 DIAGNOSIS — E785 Hyperlipidemia, unspecified: Secondary | ICD-10-CM

## 2014-05-31 DIAGNOSIS — M47896 Other spondylosis, lumbar region: Secondary | ICD-10-CM | POA: Diagnosis not present

## 2014-05-31 DIAGNOSIS — I10 Essential (primary) hypertension: Secondary | ICD-10-CM

## 2014-05-31 DIAGNOSIS — E119 Type 2 diabetes mellitus without complications: Secondary | ICD-10-CM

## 2014-05-31 MED ORDER — LISINOPRIL 5 MG PO TABS: 5.0000 mg | ORAL_TABLET | Freq: Every day | ORAL | Status: DC

## 2014-05-31 MED ORDER — GLIMEPIRIDE 4 MG PO TABS: 4.0000 mg | ORAL_TABLET | Freq: Two times a day (BID) | ORAL | Status: DC

## 2014-05-31 MED ORDER — LISINOPRIL 10 MG PO TABS: 10.0000 mg | ORAL_TABLET | Freq: Every day | ORAL | Status: DC

## 2014-05-31 MED ORDER — METFORMIN HCL 500 MG PO TABS: 1000.0000 mg | ORAL_TABLET | Freq: Two times a day (BID) | ORAL | Status: DC

## 2014-05-31 MED ORDER — PRAVASTATIN SODIUM 10 MG PO TABS: 10.0000 mg | ORAL_TABLET | Freq: Every day | ORAL | Status: DC

## 2014-05-31 MED ORDER — SITAGLIPTIN PHOSPHATE 100 MG PO TABS: 100.0000 mg | ORAL_TABLET | Freq: Every day | ORAL | Status: DC

## 2014-05-31 MED ORDER — METOPROLOL SUCCINATE ER 25 MG PO TB24: 25.0000 mg | ORAL_TABLET | Freq: Every day | ORAL | Status: DC

## 2014-05-31 MED ORDER — AMLODIPINE BESYLATE 5 MG PO TABS: 5.0000 mg | ORAL_TABLET | Freq: Every day | ORAL | Status: DC

## 2014-05-31 NOTE — Patient Instructions (Addendum)
Consider gastric [facility]    Preventive Care for Adults A healthy lifestyle and preventive care can promote health and wellness. Preventive health guidelines for men include the following key practices:  A routine yearly physical is a good way to check with your health care provider about your health and preventative screening. It is a chance to share any concerns and updates on your health and to receive a thorough exam.  Visit your dentist for a routine exam and preventative care every 6 months. Brush your teeth twice a day and floss once a day. Good oral hygiene prevents tooth decay and gum disease.  The frequency of eye exams is based on your age, health, family medical history, use of contact lenses, and other factors. Follow your health care provider's recommendations for frequency of eye exams.  Eat a healthy diet. Foods such as vegetables, fruits, whole grains, low-fat dairy products, and lean protein foods contain the nutrients you need without too many calories. Decrease your intake of foods high in solid fats, added sugars, and salt. Eat the right amount of calories for you.Get information about a proper diet from your health care provider, if necessary.  Regular physical exercise is one of the most important things you can do for your health. Most adults should get at least 150 minutes of moderate-intensity exercise (any activity that increases your heart rate and causes you to sweat) each week. In addition, most adults need muscle-strengthening exercises on 2 or more days a week.  Maintain a healthy weight. The body mass index (BMI) is a screening tool to identify possible weight problems. It provides an estimate of body fat based on height and weight. Your health care provider can find your BMI and can help you achieve or maintain a healthy weight.For adults 20 years and older:  A BMI below 18.5 is considered underweight.  A BMI of 18.5 to 24.9  is normal.  A BMI of 25 to 29.9 is considered overweight.  A BMI of 30 and above is considered obese.  Maintain normal blood lipids and cholesterol levels by exercising and minimizing your intake of saturated fat. Eat a balanced diet with plenty of fruit and vegetables. Blood tests for lipids and cholesterol should begin at age 51 and be repeated every 5 years. If your lipid or cholesterol levels are high, you are over 50, or you are at high risk for heart disease, you may need your cholesterol levels checked more frequently.Ongoing high lipid and cholesterol levels should be treated with medicines if diet and exercise are not working.  If you smoke, find out from your health care provider how to quit. If you do not use tobacco, do not start.  Lung cancer screening is recommended for adults aged 69-80 years who are at high risk for developing lung cancer because of a history of smoking. A yearly low-dose CT scan of the lungs is recommended for people who have at least a 30-pack-year history of smoking and are a current smoker or have quit within the past 15 years. A pack year of smoking is smoking an average of 1 pack of cigarettes a day for 1 year (for example: 1 pack a day for 30 years or 2 packs a day for 15 years). Yearly screening should continue until the smoker has stopped smoking for at least 15 years. Yearly screening should be stopped for people who develop a health problem that would prevent them from having lung cancer treatment.  If you choose to drink alcohol, do not have more than 2 drinks per day. One drink is considered to be 12 ounces (355 mL) of beer, 5 ounces (148 mL) of wine, or 1.5 ounces (44 mL) of liquor.  Avoid use of street drugs. Do not share needles with anyone. Ask for help if you need support or instructions about stopping the use of drugs.  High blood pressure causes heart disease and increases the risk of stroke. Your blood pressure should be checked at least every  1-2 years. Ongoing high blood pressure should be treated with medicines, if weight loss and exercise are not effective.  If you are 56-58 years old, ask your health care provider if you should take aspirin to prevent heart disease.  Diabetes screening involves taking a blood sample to check your fasting blood sugar level. This should be done once every 3 years, after age 30, if you are within normal weight and without risk factors for diabetes. Testing should be considered at a younger age or be carried out more frequently if you are overweight and have at least 1 risk factor for diabetes.  Colorectal cancer can be detected and often prevented. Most routine colorectal cancer screening begins at the age of 79 and continues through age 62. However, your health care provider may recommend screening at an earlier age if you have risk factors for colon cancer. On a yearly basis, your health care provider may provide home test kits to check for hidden blood in the stool. Use of a small camera at the end of a tube to directly examine the colon (sigmoidoscopy or colonoscopy) can detect the earliest forms of colorectal cancer. Talk to your health care provider about this at age 70, when routine screening begins. Direct exam of the colon should be repeated every 5-10 years through age 39, unless early forms of precancerous polyps or small growths are found.  People who are at an increased risk for hepatitis B should be screened for this virus. You are considered at high risk for hepatitis B if:  You were born in a country where hepatitis B occurs often. Talk with your health care provider about which countries are considered high risk.  Your parents were born in a high-risk country and you have not received a shot to protect against hepatitis B (hepatitis B vaccine).  You have HIV or AIDS.  You use needles to inject street drugs.  You live with, or have sex with, someone who has hepatitis B.  You are a man  who has sex with other men (MSM).  You get hemodialysis treatment.  You take certain medicines for conditions such as cancer, organ transplantation, and autoimmune conditions.  Hepatitis C blood testing is recommended for all people born from [birth year] through 1965 and any individual with known risks for hepatitis C.  Practice safe sex. Use condoms and avoid high-risk sexual practices to reduce the spread of sexually transmitted infections (STIs). STIs include gonorrhea, chlamydia, syphilis, trichomonas, herpes, HPV, and human immunodeficiency virus (HIV). Herpes, HIV, and HPV are viral illnesses that have no cure. They can result in disability, cancer, and death.  If you are at risk of being infected with HIV, it is recommended that you take a prescription medicine daily to prevent HIV infection. This is called preexposure prophylaxis (PrEP). You are considered at risk if:  You are a man who has sex with other men (MSM) and have other risk factors.  You are a heterosexual man,  are sexually active, and are at increased risk for HIV infection.  You take drugs by injection.  You are sexually active with a partner who has HIV.  Talk with your health care provider about whether you are at high risk of being infected with HIV. If you choose to begin PrEP, you should first be tested for HIV. You should then be tested every 3 months for as long as you are taking PrEP.  A one-time screening for abdominal aortic aneurysm (AAA) and surgical repair of large AAAs by ultrasound are recommended for men ages 85 to 72 years who are current or former smokers.  Healthy men should no longer receive prostate-specific antigen (PSA) blood tests as part of routine cancer screening. Talk with your health care provider about prostate cancer screening.  Testicular cancer screening is not recommended for adult males who have no symptoms. Screening includes self-exam, a health care provider exam, and other screening tests.  Consult with your health care provider about any symptoms you have or any concerns you have about testicular cancer.  Use sunscreen. Apply sunscreen liberally and repeatedly throughout the day. You should seek shade when your shadow is shorter than you. Protect yourself by wearing long sleeves, pants, a wide-brimmed hat, and sunglasses year round, whenever you are outdoors.  Once a month, do a whole-body skin exam, using a mirror to look at the skin on your back. Tell your health care provider about new moles, moles that have irregular borders, moles that are larger than a pencil eraser, or moles that have changed in shape or color.  Stay current with required vaccines (immunizations).  Influenza vaccine. All adults should be immunized every year.  Tetanus, diphtheria, and acellular pertussis (Td, Tdap) vaccine. An adult who has not previously received Tdap or who does not know his vaccine status should receive 1 dose of Tdap. This initial dose should be followed by tetanus and diphtheria toxoids (Td) booster doses every 10 years. Adults with an unknown or incomplete history of completing a 3-dose immunization series with Td-containing vaccines should begin or complete a primary immunization series including a Tdap dose. Adults should receive a Td booster every 10 years.  Varicella vaccine. An adult without evidence of immunity to varicella should receive 2 doses or a second dose if he has previously received 1 dose.  Human papillomavirus (HPV) vaccine. Males aged 76-21 years who have not received the vaccine previously should receive the 3-dose series. Males aged 22-26 years may be immunized. Immunization is recommended through the age of 87 years for any male who has sex with males and did not get any or all doses earlier. Immunization is recommended for any person with an immunocompromised condition through the age of 21 years if he did not get any or all doses earlier. During the 3-dose series, the  second dose should be obtained 4-8 weeks after the first dose. The third dose should be obtained 24 weeks after the first dose and 16 weeks after the second dose.  Zoster vaccine. One dose is recommended for adults aged 36 years or older unless certain conditions are present.  Measles, mumps, and rubella (MMR) vaccine. Adults born before [birth year] generally are considered immune to measles and mumps. Adults born in [birth year] or later should have 1 or more doses of MMR vaccine unless there is a contraindication to the vaccine or there is laboratory evidence of immunity to each of the three diseases. A routine second dose of MMR vaccine should be obtained  at least 28 days after the first dose for students attending postsecondary schools, health care workers, or international travelers. People who received inactivated measles vaccine or an unknown type of measles vaccine during 1963-1967 should receive 2 doses of MMR vaccine. People who received inactivated mumps vaccine or an unknown type of mumps vaccine before 1979 and are at high risk for mumps infection should consider immunization with 2 doses of MMR vaccine. Unvaccinated health care workers born before [birth year] who lack laboratory evidence of measles, mumps, or rubella immunity or laboratory confirmation of disease should consider measles and mumps immunization with 2 doses of MMR vaccine or rubella immunization with 1 dose of MMR vaccine.  Pneumococcal 13-valent conjugate (PCV13) vaccine. When indicated, a person who is uncertain of his immunization history and has no record of immunization should receive the PCV13 vaccine. An adult aged 62 years or older who has certain medical conditions and has not been previously immunized should receive 1 dose of PCV13 vaccine. This PCV13 should be followed with a dose of pneumococcal polysaccharide (PPSV23) vaccine. The PPSV23 vaccine dose should be obtained at least 8 weeks after the dose of PCV13 vaccine. An adult aged 61 years  or older who has certain medical conditions and previously received 1 or more doses of PPSV23 vaccine should receive 1 dose of PCV13. The PCV13 vaccine dose should be obtained 1 or more years after the last PPSV23 vaccine dose.  Pneumococcal polysaccharide (PPSV23) vaccine. When PCV13 is also indicated, PCV13 should be obtained first. All adults aged 9 years and older should be immunized. An adult younger than age 49 years who has certain medical conditions should be immunized. Any person who resides in a nursing home or long-term care facility should be immunized. An adult smoker should be immunized. People with an immunocompromised condition and certain other conditions should receive both PCV13 and PPSV23 vaccines. People with human immunodeficiency virus (HIV) infection should be immunized as soon as possible after diagnosis. Immunization during chemotherapy or radiation therapy should be avoided. Routine use of PPSV23 vaccine is not recommended for American Indians, [place] Natives, or people younger than 65 years unless there are medical conditions that require PPSV23 vaccine. When indicated, people who have unknown immunization and have no record of immunization should receive PPSV23 vaccine. One-time revaccination 5 years after the first dose of PPSV23 is recommended for people aged 19-64 years who have chronic kidney failure, nephrotic syndrome, asplenia, or immunocompromised conditions. People who received 1-2 doses of PPSV23 before age 26 years should receive another dose of PPSV23 vaccine at age 39 years or later if at least 5 years have passed since the previous dose. Doses of PPSV23 are not needed for people immunized with PPSV23 at or after age 36 years.  Meningococcal vaccine. Adults with asplenia or persistent complement component deficiencies should receive 2 doses of quadrivalent meningococcal conjugate (MenACWY-D) vaccine. The doses should be obtained at least 2 months apart. Microbiologists  working with certain meningococcal bacteria, [employer] recruits, people at risk during an outbreak, and people who travel to or live in countries with a high rate of meningitis should be immunized. A first-year college student up through age 70 years who is living in a residence hall should receive a dose if he did not receive a dose on or after his 16th birthday. Adults who have certain high-risk conditions should receive one or more doses of vaccine.  Hepatitis A vaccine. Adults who wish to be protected from this disease, have certain high-risk conditions,  work with hepatitis A-infected animals, work in hepatitis A research labs, or travel to or work in countries with a high rate of hepatitis A should be immunized. Adults who were previously unvaccinated and who anticipate close contact with an international adoptee during the first 60 days after arrival in the [place] from a country with a high rate of hepatitis A should be immunized.  Hepatitis B vaccine. Adults should be immunized if they wish to be protected from this disease, have certain high-risk conditions, may be exposed to blood or other infectious body fluids, are household contacts or sex partners of hepatitis B positive people, are clients or workers in certain care facilities, or travel to or work in countries with a high rate of hepatitis B.  Haemophilus influenzae type b (Hib) vaccine. A previously unvaccinated person with asplenia or sickle cell disease or having a scheduled splenectomy should receive 1 dose of Hib vaccine. Regardless of previous immunization, a recipient of a hematopoietic stem cell transplant should receive a 3-dose series 6-12 months after his successful transplant. Hib vaccine is not recommended for adults with HIV infection. Preventive Service / Frequency Ages 25 to 65  Blood pressure check.** / Every 1 to 2 years.  Lipid and cholesterol check.** / Every 5 years beginning at age 84.  Hepatitis C blood  test.** / For any individual with known risks for hepatitis C.  Skin self-exam. / Monthly.  Influenza vaccine. / Every year.  Tetanus, diphtheria, and acellular pertussis (Tdap, Td) vaccine.** / Consult your health care provider. 1 dose of Td every 10 years.  Varicella vaccine.** / Consult your health care provider.  HPV vaccine. / 3 doses over 6 months, if 48 or younger.  Measles, mumps, rubella (MMR) vaccine.** / You need at least 1 dose of MMR if you were born in 1957 or later. You may also need a second dose.  Pneumococcal 13-valent conjugate (PCV13) vaccine.** / Consult your health care provider.  Pneumococcal polysaccharide (PPSV23) vaccine.** / 1 to 2 doses if you smoke cigarettes or if you have certain conditions.  Meningococcal vaccine.** / 1 dose if you are age 77 to 71 years and a Market researcher living in a residence hall, or have one of several medical conditions. You may also need additional booster doses.  Hepatitis A vaccine.** / Consult your health care provider.  Hepatitis B vaccine.** / Consult your health care provider.  Haemophilus influenzae type b (Hib) vaccine.** / Consult your health care provider. Ages 46 to 75  Blood pressure check.** / Every 1 to 2 years.  Lipid and cholesterol check.** / Every 5 years beginning at age 80.  Lung cancer screening. / Every year if you are aged 48-80 years and have a 30-pack-year history of smoking and currently smoke or have quit within the past 15 years. Yearly screening is stopped once you have quit smoking for at least 15 years or develop a health problem that would prevent you from having lung cancer treatment.  Fecal occult blood test (FOBT) of stool. / Every year beginning at age 77 and continuing until age 18. You may not have to do this test if you get a colonoscopy every 10 years.  Flexible sigmoidoscopy** or colonoscopy.** / Every 5 years for a flexible sigmoidoscopy or every 10 years for a colonoscopy  beginning at age 87 and continuing until age 82.  Hepatitis C blood test.** / For all people born from [birth year] through 1965 and any individual with known risks for  hepatitis C.  Skin self-exam. / Monthly.  Influenza vaccine. / Every year.  Tetanus, diphtheria, and acellular pertussis (Tdap/Td) vaccine.** / Consult your health care provider. 1 dose of Td every 10 years.  Varicella vaccine.** / Consult your health care provider.  Zoster vaccine.** / 1 dose for adults aged 42 years or older.  Measles, mumps, rubella (MMR) vaccine.** / You need at least 1 dose of MMR if you were born in 1958 or later. You may also need a second dose.  Pneumococcal 13-valent conjugate (PCV13) vaccine.** / Consult your health care provider.  Pneumococcal polysaccharide (PPSV23) vaccine.** / 1 to 2 doses if you smoke cigarettes or if you have certain conditions.  Meningococcal vaccine.** / Consult your health care provider.  Hepatitis A vaccine.** / Consult your health care provider.  Hepatitis B vaccine.** / Consult your health care provider.  Haemophilus influenzae type b (Hib) vaccine.** / Consult your health care provider. Ages 71 and over  Blood pressure check.** / Every 1 to 2 years.  Lipid and cholesterol check.**/ Every 5 years beginning at age 56.  Lung cancer screening. / Every year if you are aged 63-80 years and have a 30-pack-year history of smoking and currently smoke or have quit within the past 15 years. Yearly screening is stopped once you have quit smoking for at least 15 years or develop a health problem that would prevent you from having lung cancer treatment.  Fecal occult blood test (FOBT) of stool. / Every year beginning at age 59 and continuing until age 49. You may not have to do this test if you get a colonoscopy every 10 years.  Flexible sigmoidoscopy** or colonoscopy.** / Every 5 years for a flexible sigmoidoscopy or every 10 years for a colonoscopy beginning at age 85 and  continuing until age 25.  Hepatitis C blood test.** / For all people born from [birth year] through 1966 and any individual with known risks for hepatitis C.  Abdominal aortic aneurysm (AAA) screening.** / A one-time screening for ages 76 to 30 years who are current or former smokers.  Skin self-exam. / Monthly.  Influenza vaccine. / Every year.  Tetanus, diphtheria, and acellular pertussis (Tdap/Td) vaccine.** / 1 dose of Td every 10 years.  Varicella vaccine.** / Consult your health care provider.  Zoster vaccine.** / 1 dose for adults aged 43 years or older.  Pneumococcal 13-valent conjugate (PCV13) vaccine.** / Consult your health care provider.  Pneumococcal polysaccharide (PPSV23) vaccine.** / 1 dose for all adults aged 42 years and older.  Meningococcal vaccine.** / Consult your health care provider.  Hepatitis A vaccine.** / Consult your health care provider.  Hepatitis B vaccine.** / Consult your health care provider.  Haemophilus influenzae type b (Hib) vaccine.** / Consult your health care provider. **Family history and personal history of risk and conditions may change your health care provider's recommendations. Document Released: 03/03/2001 Document Revised: 01/10/2013 Document Reviewed: 06/02/2010 [facility] Patient Information 2015 [place], [place]. This information is not intended to replace advice given to you by your health care provider. Make sure you discuss any questions you have with your health care provider.

## 2014-05-31 NOTE — Telephone Encounter (Signed)
Not sure what lab orders he wants. Orders for prior to his next appt in August? He will need hgba1c, lipid, cmp, tsh and cbc is needed

## 2014-05-31 NOTE — Progress Notes (Signed)
Pre visit review using our clinic review tool, if applicable. No additional management support is needed unless otherwise documented below in the visit note. 

## 2014-05-31 NOTE — Telephone Encounter (Signed)
Relation to pt: self  Call back number: [phone]   Reason for call:  Pt requesting lab orders please advise

## 2014-06-01 NOTE — Telephone Encounter (Signed)
Spoke with patient and placed orders for CPE labs.

## 2014-06-10 ENCOUNTER — Encounter: Payer: Self-pay | Admitting: Family Medicine

## 2014-06-10 DIAGNOSIS — E1169 Type 2 diabetes mellitus with other specified complication: Secondary | ICD-10-CM | POA: Insufficient documentation

## 2014-06-10 DIAGNOSIS — E669 Obesity, unspecified: Secondary | ICD-10-CM

## 2014-06-10 DIAGNOSIS — N200 Calculus of kidney: Secondary | ICD-10-CM

## 2014-06-10 HISTORY — DX: Calculus of kidney: N20.0

## 2014-06-10 NOTE — Assessment & Plan Note (Signed)
Tolerating statin, encouraged heart healthy diet, avoid trans fats, minimize simple carbs and saturated fats. Increase exercise as tolerated 

## 2014-06-10 NOTE — Assessment & Plan Note (Signed)
Patient encouraged to maintain heart healthy diet, regular exercise, adequate sleep. Consider daily probiotics. Take medications as prescribed. Discussed possibility of gastric procedure to address his numerous health concerns. He is not interested at this time

## 2014-06-10 NOTE — Assessment & Plan Note (Signed)
Encouraged adequate hydration. If pain worsens in back may need referral to urology

## 2014-06-10 NOTE — Assessment & Plan Note (Addendum)
hgba1c unacceptable, minimize simple carbs. Increase exercise as tolerated. Continue current meds and maximize oral meds

## 2014-06-10 NOTE — Assessment & Plan Note (Signed)
Xray shows degenerative changes and kidney stones. Encouraged to try Encouraged moist heat and gentle stretching as tolerated. May try NSAIDs and prescription meds as directed and report if symptoms worsen or seek immediate care

## 2014-06-10 NOTE — Assessment & Plan Note (Signed)
Encouraged DASH diet, decrease po intake and increase exercise as tolerated. Needs 7-8 hours of sleep nightly. Avoid trans fats, eat small, frequent meals every 4-5 hours with lean proteins, complex carbs and healthy fats. Minimize simple carbs, GMO foods. 

## 2014-06-10 NOTE — Assessment & Plan Note (Signed)
Well controlled, no changes to meds. Encouraged heart healthy diet such as the DASH diet and exercise as tolerated.  °

## 2014-06-10 NOTE — Progress Notes (Signed)
Colin Bennett  [id number] 04-29-1980 06/10/2014      Progress Note-Follow Up  Subjective  Chief Complaint  Chief Complaint  Patient presents with  . Annual Exam    HPI  Patient is a 34 y.o. male in today for routine medical care. Patient is in today for annual exam. Is complaining of some low back pain. It is intermittent but is been more constant lately. He denies any recent injury or fall. No radicular symptoms. No incontinence. Pain can worsen with position changes. No recent illness or new acute concern. Otherwise noted. Denies CP/palp/SOB/HA/congestion/fevers/GI or GU c/o. Taking meds as prescribed  Past Medical History  Diagnosis Date  . Kidney stones   . Diabetes mellitus   . Bipolar 1 disorder   . HTN (hypertension) 05/10/2012  . Preventative health care 05/14/2012  . Renal lithiasis 08/10/2012  . Bipolar disorder, unspecified 08/10/2012  . Obesity, unspecified 11/09/2012  . Pain, joint, shoulder, right 04/09/2013  . Diabetes mellitus type 2 in obese 01/26/2014  . Diabetes mellitus type 2 in obese 06/10/2014    No past surgical history on file.  Family History  Problem Relation Age of Onset  . Hypertension Other   . Diabetes Other     History   Social History  . Marital Status: Married    Spouse Name: N/A  . Number of Children: 0  . Years of Education: N/A   Occupational History  . Collateral recovery    Social History Main Topics  . Smoking status: Never Smoker   . Smokeless tobacco: Never Used  . Alcohol Use: No  . Drug Use: No  . Sexual Activity: Not on file   Other Topics Concern  . Not on file   Social History Narrative    Current Outpatient Prescriptions on File Prior to Visit  Medication Sig Dispense Refill  . baclofen (LIORESAL) 10 MG tablet Take 1 tablet (10 mg total) by mouth 3 (three) times daily. 30 each 0  . mupirocin ointment (BACTROBAN) 2 % Place 1 application into the nose daily as needed. Epistaxis/dry nose 22 g 1  . albuterol  (PROVENTIL HFA;VENTOLIN HFA) 108 (90 BASE) MCG/ACT inhaler Inhale 2 puffs into the lungs every 6 (six) hours as needed for wheezing or shortness of breath. (Patient not taking: Reported on 05/30/2014) 1 Inhaler 0   No current facility-administered medications on file prior to visit.    No Known Allergies  Review of Systems  Review of Systems  Constitutional: Positive for malaise/fatigue. Negative for fever.  HENT: Negative for congestion.   Eyes: Negative for discharge.  Respiratory: Negative for cough and shortness of breath.   Cardiovascular: Negative for chest pain, palpitations and leg swelling.  Gastrointestinal: Negative for nausea, abdominal pain and diarrhea.  Genitourinary: Negative for dysuria.  Musculoskeletal: Positive for back pain. Negative for falls.  Skin: Negative for rash.  Neurological: Negative for loss of consciousness and headaches.  Endo/Heme/Allergies: Negative for polydipsia.  Psychiatric/Behavioral: Negative for depression and suicidal ideas. The patient is not nervous/anxious and does not have insomnia.     Objective  BP 112/72 mmHg  Pulse 92  Temp(Src) 98.4 F (36.9 C) (Oral)  Ht 6\' 2"  (1.88 m)  Wt 380 lb (172.367 kg)  BMI 48.77 kg/m2  SpO2 96%  Physical Exam  Physical Exam  Constitutional: He is oriented to person, place, and time and well-developed, well-nourished, and in no distress. No distress.  HENT:  Head: Normocephalic and atraumatic.  Eyes: Conjunctivae are normal.  Neck: Neck supple. No thyromegaly present.  Cardiovascular: Normal rate, regular rhythm and normal heart sounds.   No murmur heard. Pulmonary/Chest: Effort normal and breath sounds normal. No respiratory distress.  Abdominal: He exhibits no distension and no mass. There is no tenderness.  Musculoskeletal: He exhibits no edema.  Neurological: He is alert and oriented to person, place, and time.  Skin: Skin is warm.  Psychiatric: Memory, affect and judgment normal.     Lab Results  Component Value Date   TSH 0.81 05/08/2014   Lab Results  Component Value Date   WBC 11.5* 05/08/2014   HGB 16.3 05/08/2014   HCT 47.1 05/08/2014   MCV 85.7 05/08/2014   PLT 264.0 05/08/2014   Lab Results  Component Value Date   CREATININE 0.72 05/08/2014   BUN 11 05/08/2014   NA 133* 05/08/2014   K 4.0 05/08/2014   CL 100 05/08/2014   CO2 25 05/08/2014   Lab Results  Component Value Date   ALT 41 05/08/2014   AST 29 05/08/2014   ALKPHOS 86 05/08/2014   BILITOT 0.4 05/08/2014   Lab Results  Component Value Date   CHOL 168 05/08/2014   Lab Results  Component Value Date   HDL 38.00* 05/08/2014   Lab Results  Component Value Date   LDLCALC 105* 05/08/2014   Lab Results  Component Value Date   TRIG 123.0 05/08/2014   Lab Results  Component Value Date   CHOLHDL 4 05/08/2014     Assessment & Plan   HTN (hypertension) Well controlled, no changes to meds. Encouraged heart healthy diet such as the DASH diet and exercise as tolerated.    Hyperlipidemia Tolerating statin, encouraged heart healthy diet, avoid trans fats, minimize simple carbs and saturated fats. Increase exercise as tolerated   Back pain Xray shows degenerative changes and kidney stones. Encouraged to try Encouraged moist heat and gentle stretching as tolerated. May try NSAIDs and prescription meds as directed and report if symptoms worsen or seek immediate care   Obesity Encouraged DASH diet, decrease po intake and increase exercise as tolerated. Needs 7-8 hours of sleep nightly. Avoid trans fats, eat small, frequent meals every 4-5 hours with lean proteins, complex carbs and healthy fats. Minimize simple carbs, GMO foods.   Diabetes mellitus type 2 in obese [id number] unacceptable, minimize simple carbs. Increase exercise as tolerated. Continue current meds and maximize oral meds   Nephrolithiasis Encouraged adequate hydration. If pain worsens in back may need referral to  urology   Preventative health care Patient encouraged to maintain heart healthy diet, regular exercise, adequate sleep. Consider daily probiotics. Take medications as prescribed. Discussed possibility of gastric procedure to address his numerous health concerns. He is not interested at this time

## 2014-06-27 ENCOUNTER — Ambulatory Visit (INDEPENDENT_AMBULATORY_CARE_PROVIDER_SITE_OTHER): Payer: Commercial Managed Care - PPO | Admitting: Medical

## 2014-06-27 ENCOUNTER — Encounter: Payer: Self-pay | Admitting: Medical

## 2014-06-27 VITALS — BP 113/82 | HR 132 | Temp 100.0°F | Ht 74.0 in | Wt 378.0 lb

## 2014-06-27 DIAGNOSIS — J029 Acute pharyngitis, unspecified: Secondary | ICD-10-CM

## 2014-06-27 DIAGNOSIS — M609 Myositis, unspecified: Secondary | ICD-10-CM | POA: Diagnosis not present

## 2014-06-27 DIAGNOSIS — M791 Myalgia: Secondary | ICD-10-CM

## 2014-06-27 DIAGNOSIS — R000 Tachycardia, unspecified: Secondary | ICD-10-CM

## 2014-06-27 DIAGNOSIS — E119 Type 2 diabetes mellitus without complications: Secondary | ICD-10-CM | POA: Diagnosis not present

## 2014-06-27 DIAGNOSIS — R509 Fever, unspecified: Secondary | ICD-10-CM | POA: Diagnosis not present

## 2014-06-27 DIAGNOSIS — IMO0001 Reserved for inherently not codable concepts without codable children: Secondary | ICD-10-CM

## 2014-06-27 HISTORY — DX: Tachycardia, unspecified: R00.0

## 2014-06-27 LAB — POCT INFLUENZA A/B
INFLUENZA A, POC: NEGATIVE
Influenza B, POC: NEGATIVE

## 2014-06-27 LAB — GLUCOSE, POCT (MANUAL RESULT ENTRY): POC Glucose: 142 mg/dl — AB (ref 70–99)

## 2014-06-27 LAB — POCT RAPID STREP A (OFFICE): Rapid Strep A Screen: POSITIVE — AB

## 2014-06-27 MED ORDER — AMOXICILLIN 875 MG PO TABS: 875.0000 mg | ORAL_TABLET | Freq: Two times a day (BID) | ORAL | Status: DC

## 2014-06-27 NOTE — Patient Instructions (Addendum)
   Your strep test was positive. I am prescribing antibiotic amoxicillin. Rest hydrate, tylenol for fever and warm salt water gargles. Follow up in 7 days or as needed.  Note since you had no throat pain but other symptoms some work up had to be done based on our med history and other symptoms.   All of your symptoms could be accounted by strep as severity for presentation can vary. If symptoms worsen or change notify us  Follow up in 7 days or as needed. Would recommend tylenol for fever. If fever persists could use ibuprofen. This should bring temp and pulse rate down.

## 2014-06-27 NOTE — Progress Notes (Signed)
Subjective:    Patient ID: Colin Bennett, male    DOB: 10/05/1980, 34 y.o.   MRN: [id number]  HPI  Pt in states he feels run down and exhausted. He feels cold. Decresaed appetite. Limbs feel heavy. Bp was higher recenlty was 137/94. Which is higher than usual for him. But today when check by lpn was lower.  Pulse has been higher. He has felt chils. Yesterday was sweating. Presently fever of 100. No sinus pressure, no st, no chest congestion. Mild indigestion recently. Last time he felt that was last week. None recenty. No abdomen pain. No diarrhea.   Pt blood sugar the other night was 203. That was maybe 5 hours after eating.   Decreased appetite.   Today in office bs 140.   Review of Systems  Constitutional: Positive for chills and fatigue.  HENT: Negative for congestion, dental problem, facial swelling, nosebleeds, postnasal drip, rhinorrhea and sore throat.        Pharyngitis only by exam. No pain.  Respiratory: Negative for cough, chest tightness, shortness of breath and wheezing.   Cardiovascular: Negative for chest pain and palpitations.  Gastrointestinal: Negative for nausea, vomiting, abdominal pain, constipation, blood in stool and abdominal distention.  Musculoskeletal: Positive for myalgias. Negative for back pain and neck stiffness.       Some myalgias recently. Over 2 days.  Psychiatric/Behavioral: Negative for hallucinations, confusion, dysphoric mood, decreased concentration and agitation. The patient is not hyperactive.     Past Medical History  Diagnosis Date  . Kidney stones   . Diabetes mellitus   . Bipolar 1 disorder   . HTN (hypertension) 05/10/2012  . Preventative health care 05/14/2012  . Renal lithiasis 08/10/2012  . Bipolar disorder, unspecified 08/10/2012  . Obesity, unspecified 11/09/2012  . Pain, joint, shoulder, right 04/09/2013  . Diabetes mellitus type 2 in obese 01/26/2014  . Diabetes mellitus type 2 in obese 06/10/2014  . Nephrolithiasis 06/10/2014      History   Social History  . Marital Status: Married    Spouse Name: N/A  . Number of Children: 0  . Years of Education: N/A   Occupational History  . Collateral recovery    Social History Main Topics  . Smoking status: Never Smoker   . Smokeless tobacco: Never Used  . Alcohol Use: No  . Drug Use: No  . Sexual Activity: Not on file   Other Topics Concern  . Not on file   Social History Narrative    No past surgical history on file.  Family History  Problem Relation Age of Onset  . Hypertension Other   . Diabetes Other     No Known Allergies  Current Outpatient Prescriptions on File Prior to Visit  Medication Sig Dispense Refill  . albuterol (PROVENTIL HFA;VENTOLIN HFA) 108 (90 BASE) MCG/ACT inhaler Inhale 2 puffs into the lungs every 6 (six) hours as needed for wheezing or shortness of breath. 1 Inhaler 0  . baclofen (LIORESAL) 10 MG tablet Take 1 tablet (10 mg total) by mouth 3 (three) times daily. 30 each 0  . lisinopril (PRINIVIL,ZESTRIL) 5 MG tablet Take 1 tablet (5 mg total) by mouth daily. 90 tablet 3  . metFORMIN (GLUCOPHAGE) 500 MG tablet Take 2 tablets (1,000 mg total) by mouth 2 (two) times daily with a meal. 180 tablet 2  . metoprolol succinate (TOPROL-XL) 25 MG 24 hr tablet Take 1 tablet (25 mg total) by mouth daily. 90 tablet 3  . mupirocin ointment (BACTROBAN) 2 %  Place 1 application into the nose daily as needed. Epistaxis/dry nose 22 g 1  . pravastatin (PRAVACHOL) 10 MG tablet Take 1 tablet (10 mg total) by mouth daily. 90 tablet 2  . sitaGLIPtin (JANUVIA) 100 MG tablet Take 1 tablet (100 mg total) by mouth daily. 30 tablet 3   No current facility-administered medications on file prior to visit.    BP 113/82 mmHg  Pulse 132  Temp(Src) 100 F (37.8 C) (Oral)  Ht 6\' 2"  (1.88 m)  Wt 378 lb (171.46 kg)  BMI 48.51 kg/m2  SpO2 95%      Objective:   Physical Exam        Assessment & Plan:

## 2014-06-27 NOTE — Progress Notes (Signed)
Pre visit review using our clinic review tool, if applicable. No additional management support is needed unless otherwise documented below in the visit note. 

## 2014-07-05 ENCOUNTER — Telehealth: Payer: Self-pay | Admitting: Family Medicine

## 2014-07-05 NOTE — Telephone Encounter (Signed)
So I can help if he does well on first line medications, if he has a complicated mix of meds we will need psychiatry. See what meds have worked for him in past and then I can advise and make referral or give him an appt

## 2014-07-05 NOTE — Telephone Encounter (Signed)
Called the patient and he has done well in the past on Lamictal and Effexor ER.

## 2014-07-05 NOTE — Telephone Encounter (Signed)
OK to start Lamictal 25 mg po qd and Effexor XR 37.5 qd give him 2 weeks worth of both and bring him in in  Next 2 weeks to discuss increasing meds from there. If he is in any danger, suicidal or engaging in risky behavior he needs to come in sooner or go to ER at [facility]

## 2014-07-05 NOTE — Telephone Encounter (Signed)
Caller name: Bunnie Lederman Relationship to patient: self Can be reached: [phone] Pharm[facility]: [facility] in [place] if meds ordered  Reason for call: Pt calling in about his bipolor disorder. He said that it is bothersome again and is wanting to know if Dr. Charlett Blake is comfortable prescribing meds for him or if he needs referral to someone else. Please call pt back.

## 2014-07-06 MED ORDER — LAMOTRIGINE 25 MG PO TABS: 25.0000 mg | ORAL_TABLET | Freq: Every day | ORAL | Status: DC

## 2014-07-06 MED ORDER — VENLAFAXINE HCL ER 37.5 MG PO CP24: 37.5000 mg | ORAL_CAPSULE | Freq: Every day | ORAL | Status: DC

## 2014-07-06 NOTE — Addendum Note (Signed)
Addended by: Sharon Seller B on: 07/06/2014 08:29 AM   Modules accepted: Orders

## 2014-07-06 NOTE — Telephone Encounter (Signed)
Patient had to reschedule 6/20 appt stating that he would only be on meds for three days. Scheduled appt for 7/15(first available). Patient is requesting more meds to be called in stating that he was only given a two week supply

## 2014-07-06 NOTE — Telephone Encounter (Signed)
Medications sent in to [facility] in [place] as instructed.  Patient has been notified and did agree to schedule appt.

## 2014-07-06 NOTE — Telephone Encounter (Signed)
Patient informed ok to send in #30 due to change in appt.

## 2014-07-09 ENCOUNTER — Ambulatory Visit: Payer: Commercial Managed Care - PPO | Admitting: Family Medicine

## 2014-07-13 ENCOUNTER — Ambulatory Visit (INDEPENDENT_AMBULATORY_CARE_PROVIDER_SITE_OTHER): Payer: Commercial Managed Care - PPO | Admitting: Physician Assistant

## 2014-07-13 ENCOUNTER — Ambulatory Visit (HOSPITAL_BASED_OUTPATIENT_CLINIC_OR_DEPARTMENT_OTHER)
Admission: RE | Admit: 2014-07-13 | Discharge: 2014-07-13 | Disposition: A | Discharge status: Home / Self Care | Payer: Commercial Managed Care - PPO | Source: Ambulatory Visit | Attending: Physician Assistant | Admitting: Physician Assistant

## 2014-07-13 ENCOUNTER — Encounter: Payer: Self-pay | Admitting: Physician Assistant

## 2014-07-13 VITALS — BP 100/62 | HR 84 | Temp 98.1°F | Ht 74.0 in | Wt 374.2 lb

## 2014-07-13 DIAGNOSIS — N5089 Other specified disorders of the male genital organs: Secondary | ICD-10-CM

## 2014-07-13 DIAGNOSIS — N508 Other specified disorders of male genital organs: Secondary | ICD-10-CM | POA: Diagnosis not present

## 2014-07-13 DIAGNOSIS — N200 Calculus of kidney: Secondary | ICD-10-CM | POA: Diagnosis not present

## 2014-07-13 DIAGNOSIS — N509 Disorder of male genital organs, unspecified: Secondary | ICD-10-CM | POA: Diagnosis present

## 2014-07-13 DIAGNOSIS — N433 Hydrocele, unspecified: Secondary | ICD-10-CM | POA: Insufficient documentation

## 2014-07-13 LAB — POCT URINALYSIS DIPSTICK
BILIRUBIN UA: NEGATIVE
GLUCOSE UA: NEGATIVE
Ketones, UA: NEGATIVE
Leukocytes, UA: NEGATIVE
Nitrite, UA: NEGATIVE
UROBILINOGEN UA: 0.2
pH, UA: 6

## 2014-07-13 LAB — POCT URINE PREGNANCY: Preg Test, Ur: NEGATIVE

## 2014-07-13 NOTE — Progress Notes (Signed)
Pre visit review using our clinic review tool, if applicable. No additional management support is needed unless otherwise documented below in the visit note. 

## 2014-07-13 NOTE — Progress Notes (Signed)
Patient presents to clinic today c/o noticing a lump on right posterior testicle.  Denies pain but endorses sensation of pressure. No mass noted elsewhere.  Denies fever, chills, malaise.  Endorses hematuria x 2-3 days. Has prior history of kidney stones.  Endorses passing stone 2 days ago.  Denies dysuria, urinary frequency or urgency.   Past Medical History  Diagnosis Date  . Kidney stones   . Diabetes mellitus   . Bipolar 1 disorder   . HTN (hypertension) 05/10/2012  . Preventative health care 05/14/2012  . Renal lithiasis 08/10/2012  . Bipolar disorder, unspecified 08/10/2012  . Obesity, unspecified 11/09/2012  . Pain, joint, shoulder, right 04/09/2013  . Diabetes mellitus type 2 in obese 01/26/2014  . Diabetes mellitus type 2 in obese 06/10/2014  . Nephrolithiasis 06/10/2014    Current Outpatient Prescriptions on File Prior to Visit  Medication Sig Dispense Refill  . albuterol (PROVENTIL HFA;VENTOLIN HFA) 108 (90 BASE) MCG/ACT inhaler Inhale 2 puffs into the lungs every 6 (six) hours as needed for wheezing or shortness of breath. 1 Inhaler 0  . baclofen (LIORESAL) 10 MG tablet Take 1 tablet (10 mg total) by mouth 3 (three) times daily. 30 each 0  . lamoTRIgine (LAMICTAL) 25 MG tablet Take 1 tablet (25 mg total) by mouth daily. 30 tablet 0  . lisinopril (PRINIVIL,ZESTRIL) 5 MG tablet Take 1 tablet (5 mg total) by mouth daily. 90 tablet 3  . metFORMIN (GLUCOPHAGE) 500 MG tablet Take 2 tablets (1,000 mg total) by mouth 2 (two) times daily with a meal. 180 tablet 2  . metoprolol succinate (TOPROL-XL) 25 MG 24 hr tablet Take 1 tablet (25 mg total) by mouth daily. 90 tablet 3  . mupirocin ointment (BACTROBAN) 2 % Place 1 application into the nose daily as needed. Epistaxis/dry nose 22 g 1  . pravastatin (PRAVACHOL) 10 MG tablet Take 1 tablet (10 mg total) by mouth daily. 90 tablet 2  . sitaGLIPtin (JANUVIA) 100 MG tablet Take 1 tablet (100 mg total) by mouth daily. 30 tablet 3  . venlafaxine  XR (EFFEXOR-XR) 37.5 MG 24 hr capsule Take 1 capsule (37.5 mg total) by mouth daily. 30 capsule 0   No current facility-administered medications on file prior to visit.   No Known Allergies  Family History  Problem Relation Age of Onset  . Hypertension Other   . Diabetes Other     History   Social History  . Marital Status: Married    Spouse Name: N/A  . Number of Children: 0  . Years of Education: N/A   Occupational History  . Collateral recovery    Social History Main Topics  . Smoking status: Never Smoker   . Smokeless tobacco: Never Used  . Alcohol Use: No  . Drug Use: No  . Sexual Activity: Not on file   Other Topics Concern  . None   Social History Narrative   Review of Systems - See HPI.  All other ROS are negative.  BP 100/62 mmHg  Pulse 84  Temp(Src) 98.1 F (36.7 C) (Oral)  Ht _0  (1.88 m)  Wt 374 lb 3.2 oz (169.736 kg)  BMI 48.02 kg/m2  SpO2 97%  Physical Exam  Constitutional: He is well-developed, well-nourished, and in no distress.  HENT:  Head: Normocephalic and atraumatic.  Cardiovascular: Normal rate, regular rhythm, normal heart sounds and intact distal pulses.   Pulmonary/Chest: Effort normal and breath sounds normal. No respiratory distress. He has no wheezes. He has no  rales. He exhibits no tenderness.  Genitourinary: Penis normal. He exhibits abnormal testicular mass. He exhibits no testicular tenderness, no abnormal scrotal mass, no scrotal tenderness and no epididymal tenderness.  Skin: Skin is warm and dry. No rash noted.  Psychiatric: Affect normal.  Vitals reviewed.  Recent Results (from the past 2160 hour(s))  CBC w/Diff     Status: Abnormal   Collection Time: 05/08/14  8:06 AM  Result Value Ref Range   WBC 11.5 (H) 4.0 - 10.5 K/uL   RBC 5.50 4.22 - 5.81 Mil/uL   Hemoglobin 16.3 13.0 - 17.0 g/dL   HCT 47.1 39.0 - 52.0 %   MCV 85.7 78.0 - 100.0 fl   MCHC 34.6 30.0 - 36.0 g/dL   RDW 13.3 11.5 - 15.5 %   Platelets 264.0  150.0 - 400.0 K/uL   Neutrophils Relative % 82.3 (H) 43.0 - 77.0 %   Lymphocytes Relative 13.3 12.0 - 46.0 %   Monocytes Relative 4.1 3.0 - 12.0 %   Eosinophils Relative 0.1 0.0 - 5.0 %   Basophils Relative 0.2 0.0 - 3.0 %   Neutro Abs 9.5 (H) 1.4 - 7.7 K/uL   Lymphs Abs 1.5 0.7 - 4.0 K/uL   Monocytes Absolute 0.5 0.1 - 1.0 K/uL   Eosinophils Absolute 0.0 0.0 - 0.7 K/uL   Basophils Absolute 0.0 0.0 - 0.1 K/uL  TSH     Status: None   Collection Time: 05/08/14  8:06 AM  Result Value Ref Range   TSH 0.81 0.35 - 4.50 uIU/mL  Comp Met (CMET)     Status: Abnormal   Collection Time: 05/08/14  8:06 AM  Result Value Ref Range   Sodium 133 (L) 135 - 145 mEq/L   Potassium 4.0 3.5 - 5.1 mEq/L   Chloride 100 96 - 112 mEq/L   CO2 25 19 - 32 mEq/L   Glucose, Bld 229 (H) 70 - 99 mg/dL   BUN 11 6 - 23 mg/dL   Creatinine, Ser 0.72 0.40 - 1.50 mg/dL   Total Bilirubin 0.4 0.2 - 1.2 mg/dL   Alkaline Phosphatase 86 39 - 117 U/L   AST 29 0 - 37 U/L   ALT 41 0 - 53 U/L   Total Protein 7.4 6.0 - 8.3 g/dL   Albumin 3.9 3.5 - 5.2 g/dL   Calcium 9.8 8.4 - 10.5 mg/dL   GFR 132.09 >60.00 mL/min  Hemoglobin A1c     Status: Abnormal   Collection Time: 05/08/14  8:06 AM  Result Value Ref Range   Hgb A1c MFr Bld 8.9 (H) 4.6 - 6.5 %    Comment: Glycemic Control Guidelines for People with Diabetes:Non Diabetic:  <6%Goal of Therapy: <7%Additional Action Suggested:  >8%   Lipid panel     Status: Abnormal   Collection Time: 05/08/14  4:07 PM  Result Value Ref Range   Cholesterol 168 0 - 200 mg/dL    Comment: ATP III Classification       Desirable:  < 200 mg/dL               Borderline High:  200 - 239 mg/dL          High:  > = 240 mg/dL   Triglycerides 123.0 0.0 - 149.0 mg/dL    Comment: Normal:  <150 mg/dLBorderline High:  150 - 199 mg/dL   HDL 38.00 (L) >39.00 mg/dL   VLDL 24.6 0.0 - 40.0 mg/dL   LDL Cholesterol 105 (H) 0 - 99 mg/dL  Total CHOL/HDL Ratio 4     Comment:                Men           Women1/2 Average Risk     3.4          3.3Average Risk          5.0          4.42X Average Risk          9.6          7.13X Average Risk          15.0          11.0                       NonHDL 130.00     Comment: NOTE:  Non-HDL goal should be 30 mg/dL higher than patient's LDL goal (i.e. LDL goal of < 70 mg/dL, would have non-HDL goal of < 100 mg/dL)  POCT Glucose (CBG)     Status: Abnormal   Collection Time: 06/27/14  5:14 PM  Result Value Ref Range   POC Glucose 142 (A) 70 - 99 mg/dl  POCT rapid strep A     Status: Abnormal   Collection Time: 06/27/14  5:20 PM  Result Value Ref Range   Rapid Strep A Screen Positive (A) Negative  POCT Influenza A/B     Status: Normal   Collection Time: 06/27/14  5:20 PM  Result Value Ref Range   Influenza A, POC Negative    Influenza B, POC Negative   POCT urine pregnancy     Status: None   Collection Time: 07/13/14  9:55 AM  Result Value Ref Range   Preg Test, Ur Negative Negative  POCT urinalysis dipstick     Status: None   Collection Time: 07/13/14  9:56 AM  Result Value Ref Range   Color, UA dark-yellow    Clarity, UA clear    Glucose, UA neg    Bilirubin, UA neg    Ketones, UA neg    Spec Grav, UA >=1.030    Blood, UA 3+    pH, UA 6.0    Protein, UA trace    Urobilinogen, UA 0.2    Nitrite, UA neg    Leukocytes, UA Negative Negative    Assessment/Plan: Testicular mass Will obtain US scrotum and pelvic arterial/venous doppler to further assess.  HCG negative.  Reassurance given to patient that most of these "masses" are benign.  Will know more once [place] has resulted.  Recurrent nephrolithiasis Endorses passing 2 stones in past week without pain.  + Hematuria. Recent x-ray reveals bilateral nephrolithiasis. Referral to Urology placed for ongoing management and prevention.

## 2014-07-13 NOTE — Patient Instructions (Signed)
Please stay well hydrated.  I am setting you up with Urology for recurrent kidney stones. You will be contacted for an appointment.  For the "lump", please avoid messing with the area as it may cause it to be inflamed.  Stop by the front desk to speak with Marj or Delsa Sale about scheduling your [place]. I will call you with your results.

## 2014-07-13 NOTE — Assessment & Plan Note (Signed)
Will obtain US scrotum and pelvic arterial/venous doppler to further assess.  HCG negative.  Reassurance given to patient that most of these "masses" are benign.  Will know more once [place] has resulted.

## 2014-07-13 NOTE — Assessment & Plan Note (Signed)
Endorses passing 2 stones in past week without pain.  + Hematuria. Recent x-ray reveals bilateral nephrolithiasis. Referral to Urology placed for ongoing management and prevention.

## 2014-07-16 ENCOUNTER — Other Ambulatory Visit: Payer: Self-pay

## 2014-07-26 LAB — HM DIABETES EYE EXAM

## 2014-07-30 ENCOUNTER — Encounter: Payer: Self-pay | Admitting: Family Medicine

## 2014-08-03 ENCOUNTER — Ambulatory Visit (INDEPENDENT_AMBULATORY_CARE_PROVIDER_SITE_OTHER): Payer: Commercial Managed Care - PPO | Admitting: Family Medicine

## 2014-08-03 ENCOUNTER — Encounter: Payer: Self-pay | Admitting: Family Medicine

## 2014-08-03 VITALS — BP 110/82 | HR 102 | Temp 98.6°F | Ht 74.0 in | Wt 374.2 lb

## 2014-08-03 DIAGNOSIS — I10 Essential (primary) hypertension: Secondary | ICD-10-CM | POA: Diagnosis not present

## 2014-08-03 DIAGNOSIS — R000 Tachycardia, unspecified: Secondary | ICD-10-CM | POA: Diagnosis not present

## 2014-08-03 DIAGNOSIS — E785 Hyperlipidemia, unspecified: Secondary | ICD-10-CM | POA: Diagnosis not present

## 2014-08-03 DIAGNOSIS — E119 Type 2 diabetes mellitus without complications: Secondary | ICD-10-CM

## 2014-08-03 DIAGNOSIS — E669 Obesity, unspecified: Secondary | ICD-10-CM

## 2014-08-03 DIAGNOSIS — E1169 Type 2 diabetes mellitus with other specified complication: Secondary | ICD-10-CM

## 2014-08-03 MED ORDER — METOPROLOL SUCCINATE ER 50 MG PO TB24: 50.0000 mg | ORAL_TABLET | Freq: Every day | ORAL | Status: DC

## 2014-08-03 MED ORDER — LAMOTRIGINE 100 MG PO TABS: 50.0000 mg | ORAL_TABLET | Freq: Every day | ORAL | Status: DC

## 2014-08-03 MED ORDER — VENLAFAXINE HCL ER 75 MG PO CP24: 75.0000 mg | ORAL_CAPSULE | Freq: Every day | ORAL | Status: DC

## 2014-08-03 NOTE — Patient Instructions (Signed)

## 2014-08-03 NOTE — Progress Notes (Signed)
Colin Bennett  [id number] Nov 20, 1980 08/03/2014      Progress Note-Follow Up  Subjective  Chief Complaint  Chief Complaint  Patient presents with  . Follow-up    mood swings    HPI  Patient is a 34 y.o. male in today for routine medical care. Patient is in today for follow-up. Is now on Effexor and Lamictal from his Psychiatrist, Dr Tomasita Crumble. No concerns with the medication. Continues to struggle with shoulder pain. No recent illness. Denies CP/palp/SOB/HA/congestion/fevers/GI or GU c/o. Taking meds as prescribed  Past Medical History  Diagnosis Date  . Kidney stones   . Diabetes mellitus   . Bipolar 1 disorder   . HTN (hypertension) 05/10/2012  . Preventative health care 05/14/2012  . Renal lithiasis 08/10/2012  . Bipolar disorder, unspecified 08/10/2012  . Obesity, unspecified 11/09/2012  . Pain, joint, shoulder, right 04/09/2013  . Diabetes mellitus type 2 in obese 01/26/2014  . Diabetes mellitus type 2 in obese 06/10/2014  . Nephrolithiasis 06/10/2014    History reviewed. No pertinent past surgical history.  Family History  Problem Relation Age of Onset  . Hypertension Other   . Diabetes Other     History   Social History  . Marital Status: Married    Spouse Name: N/A  . Number of Children: 0  . Years of Education: N/A   Occupational History  . Collateral recovery    Social History Main Topics  . Smoking status: Never Smoker   . Smokeless tobacco: Never Used  . Alcohol Use: No  . Drug Use: No  . Sexual Activity: Not on file   Other Topics Concern  . Not on file   Social History Narrative    Current Outpatient Prescriptions on File Prior to Visit  Medication Sig Dispense Refill  . lamoTRIgine (LAMICTAL) 25 MG tablet Take 1 tablet (25 mg total) by mouth daily. 30 tablet 0  . lisinopril (PRINIVIL,ZESTRIL) 5 MG tablet Take 1 tablet (5 mg total) by mouth daily. 90 tablet 3  . metFORMIN (GLUCOPHAGE) 500 MG tablet Take 2 tablets (1,000 mg total) by mouth 2  (two) times daily with a meal. 180 tablet 2  . metoprolol succinate (TOPROL-XL) 25 MG 24 hr tablet Take 1 tablet (25 mg total) by mouth daily. 90 tablet 3  . mupirocin ointment (BACTROBAN) 2 % Place 1 application into the nose daily as needed. Epistaxis/dry nose 22 g 1  . pravastatin (PRAVACHOL) 10 MG tablet Take 1 tablet (10 mg total) by mouth daily. 90 tablet 2  . sitaGLIPtin (JANUVIA) 100 MG tablet Take 1 tablet (100 mg total) by mouth daily. 30 tablet 3  . venlafaxine XR (EFFEXOR-XR) 37.5 MG 24 hr capsule Take 1 capsule (37.5 mg total) by mouth daily. 30 capsule 0  . albuterol (PROVENTIL HFA;VENTOLIN HFA) 108 (90 BASE) MCG/ACT inhaler Inhale 2 puffs into the lungs every 6 (six) hours as needed for wheezing or shortness of breath. (Patient not taking: Reported on 08/03/2014) 1 Inhaler 0  . baclofen (LIORESAL) 10 MG tablet Take 1 tablet (10 mg total) by mouth 3 (three) times daily. (Patient not taking: Reported on 08/03/2014) 30 each 0   No current facility-administered medications on file prior to visit.    No Known Allergies  Review of Systems  Review of Systems  Constitutional: Positive for malaise/fatigue. Negative for fever.  HENT: Negative for congestion.   Eyes: Negative for discharge.  Respiratory: Negative for shortness of breath.   Cardiovascular: Negative for chest pain, palpitations  and leg swelling.  Gastrointestinal: Negative for nausea, abdominal pain and diarrhea.  Genitourinary: Negative for dysuria.  Musculoskeletal: Negative for falls.  Skin: Negative for rash.  Neurological: Negative for loss of consciousness and headaches.  Endo/Heme/Allergies: Negative for polydipsia.  Psychiatric/Behavioral: Positive for depression. Negative for suicidal ideas. The patient is nervous/anxious. The patient does not have insomnia.     Objective  BP 110/82 mmHg  Pulse 123  Temp(Src) 98.6 F (37 C) (Oral)  Ht 6\' 2"  (1.88 m)  Wt 374 lb 4 oz (169.759 kg)  BMI 48.03 kg/m2  SpO2  96%  Physical Exam  Physical Exam  Constitutional: He is oriented to person, place, and time and well-developed, well-nourished, and in no distress. No distress.  HENT:  Head: Normocephalic and atraumatic.  Eyes: Conjunctivae are normal.  Neck: Neck supple. No thyromegaly present.  Cardiovascular: Normal rate, regular rhythm and normal heart sounds.   No murmur heard. Pulmonary/Chest: Effort normal and breath sounds normal. No respiratory distress.  Abdominal: He exhibits no distension and no mass. There is no tenderness.  Musculoskeletal: He exhibits no edema.  Neurological: He is alert and oriented to person, place, and time.  Skin: Skin is warm.  Psychiatric: Memory, affect and judgment normal.    Lab Results  Component Value Date   TSH 0.81 05/08/2014   Lab Results  Component Value Date   WBC 11.5* 05/08/2014   HGB 16.3 05/08/2014   HCT 47.1 05/08/2014   MCV 85.7 05/08/2014   PLT 264.0 05/08/2014   Lab Results  Component Value Date   CREATININE 0.72 05/08/2014   BUN 11 05/08/2014   NA 133* 05/08/2014   K 4.0 05/08/2014   CL 100 05/08/2014   CO2 25 05/08/2014   Lab Results  Component Value Date   ALT 41 05/08/2014   AST 29 05/08/2014   ALKPHOS 86 05/08/2014   BILITOT 0.4 05/08/2014   Lab Results  Component Value Date   CHOL 168 05/08/2014   Lab Results  Component Value Date   HDL 38.00* 05/08/2014   Lab Results  Component Value Date   LDLCALC 105* 05/08/2014   Lab Results  Component Value Date   TRIG 123.0 05/08/2014   Lab Results  Component Value Date   CHOLHDL 4 05/08/2014     Assessment & Plan  HTN (hypertension) Well controlled, no changes to meds. Encouraged heart healthy diet such as the DASH diet and exercise as tolerated.   Hyperlipidemia Encouraged heart healthy diet, increase exercise, avoid trans fats, consider a krill oil cap daily  Obesity Encouraged DASH diet, decrease po intake and increase exercise as tolerated. Needs  7-8 hours of sleep nightly. Avoid trans fats, eat small, frequent meals every 4-5 hours with lean proteins, complex carbs and healthy fats. Minimize simple carbs  Tachycardia Metoprolol XR 50 mg tab 1 tab po daily. Improved on recheck

## 2014-08-03 NOTE — Progress Notes (Signed)
Pre visit review using our clinic review tool, if applicable. No additional management support is needed unless otherwise documented below in the visit note. 

## 2014-08-20 ENCOUNTER — Ambulatory Visit (INDEPENDENT_AMBULATORY_CARE_PROVIDER_SITE_OTHER): Payer: Commercial Managed Care - PPO | Admitting: Family Medicine

## 2014-08-20 ENCOUNTER — Encounter: Payer: Self-pay | Admitting: Family Medicine

## 2014-08-20 VITALS — BP 114/80 | HR 76 | Ht 74.0 in | Wt 373.0 lb

## 2014-08-20 DIAGNOSIS — M9903 Segmental and somatic dysfunction of lumbar region: Secondary | ICD-10-CM | POA: Diagnosis not present

## 2014-08-20 DIAGNOSIS — M9908 Segmental and somatic dysfunction of rib cage: Secondary | ICD-10-CM

## 2014-08-20 DIAGNOSIS — M7541 Impingement syndrome of right shoulder: Secondary | ICD-10-CM

## 2014-08-20 DIAGNOSIS — M546 Pain in thoracic spine: Secondary | ICD-10-CM | POA: Diagnosis not present

## 2014-08-20 DIAGNOSIS — M9902 Segmental and somatic dysfunction of thoracic region: Secondary | ICD-10-CM

## 2014-08-20 DIAGNOSIS — M999 Biomechanical lesion, unspecified: Secondary | ICD-10-CM

## 2014-08-20 MED ORDER — MELOXICAM 15 MG PO TABS: 15.0000 mg | ORAL_TABLET | Freq: Every day | ORAL | Status: DC

## 2014-08-20 NOTE — Assessment & Plan Note (Signed)
Encouraged DASH diet, decrease po intake and increase exercise as tolerated. Needs 7-8 hours of sleep nightly. Avoid trans fats, eat small, frequent meals every 4-5 hours with lean proteins, complex carbs and healthy fats. Minimize simple carbs 

## 2014-08-20 NOTE — Assessment & Plan Note (Signed)
Multifactorial with patient having kidney stones, poor core strength, and overweight. We discussed the importance of core strength as well as postural exercises. We did discuss with patient about the possibility of further imaging which we do not think is necessary at this time. Patient did respond fairly well to osteopathic manipulation. Patient will be seen another physician for the kidney stones and hopefully this can be better result as well. Patient will come back and see me again in 3-4 weeks for further evaluation and treatment.

## 2014-08-20 NOTE — Progress Notes (Signed)
Pre visit review using our clinic review tool, if applicable. No additional management support is needed unless otherwise documented below in the visit note. 

## 2014-08-20 NOTE — Assessment & Plan Note (Addendum)
Metoprolol XR 50 mg tab 1 tab po daily. Improved on recheck

## 2014-08-20 NOTE — Assessment & Plan Note (Signed)
Decision today to treat with OMT was based on Physical Exam  After verbal consent patient was treated with HVLA, ME, FPR techniques in thoracic, rib,, lumbar areas  Patient tolerated the procedure well with improvement in symptoms  Patient given exercises, stretches and lifestyle modifications  See medications in patient instructions if given  Patient will follow up in 4-6 week

## 2014-08-20 NOTE — Patient Instructions (Signed)
Good to see you Tart cherry extract daily Vitamin D 2000 IU daily  Nitroglycerin Protocol   Apply 1/4 nitroglycerin patch to affected area daily.  Change position of patch within the affected area every 24 hours.  You may experience a headache during the first 1-2 weeks of using the patch, these should subside.  If you experience headaches after beginning nitroglycerin patch treatment, you may take your preferred over the counter pain reliever.  Another side effect of the nitroglycerin patch is skin irritation or rash related to patch adhesive.  Please notify our office if you develop more severe headaches or rash, and stop the patch.  Tendon healing with nitroglycerin patch may require 12 to 24 weeks depending on the extent of injury.  Men should not use if taking Viagra, Cialis, or Levitra.   Do not use if you have migraines or rosacea.   Ice is your friend Call me in 2 weeks on the shoulder and if better great and if not then we will consider MRI Stand on wall with heels butt shoulder and head touching for a goal of 5 minutes daily Good luck with the kidney stones.  See me otherwise in 3-4 weeks for another manipulation of the back.

## 2014-08-20 NOTE — Progress Notes (Signed)
[name] [address] [place],  [address] Phone: [phone] Subjective:    CC: Back painfollow-up, right shoulder pain follow up  FOY:DXAJOINOMV Colin Bennett is a 34 y.o. male coming in with complaint of back pain. 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 Continues to respond well to home exercises especially postural exercises. Patient states that the low back is started and we will more painful. Patient did have recent x-rays from primary care provider. X-rays were reviewed by me. Patient does have mild multilevel degenerative changes but patient also has nephrolithiasis. Patient states that the pain seems to be more of a dull aching throbbing pain and more of a colicky pain.  Patient has had right shoulder pain previously as well. Patient is had 2 injections over the course last 8 months. Patient states each time he has an injection and does help but then the pain started coming back. Patient states this time when he did one internal range of motion of the arm he had severe pain and since then has not gone away. States that it's very uncomfortable at night. Has not taken any medications for it at this time. States though that it is starting to affect his daily activities.     Past medical history, social, surgical and family history all reviewed in electronic medical record.   Review of Systems: No headache, visual changes, nausea, vomiting, diarrhea, constipation, dizziness, abdominal pain, skin rash, fevers, chills, night sweats, weight loss, swollen lymph nodes, body aches, joint swelling, muscle aches, chest pain, shortness of breath, mood changes.   Objective Blood pressure 114/80, pulse 76, height 6\' 2"  (1.88 m), weight 373 lb (169.192 kg), SpO2 96 %.  General: No apparent distress alert and oriented x3 mood and affect normal, dressed appropriately. Morbidly obese HEENT: Pupils equal, extraocular  movements intact  Respiratory: Patient's speak in full sentences and does not appear short of breath  Cardiovascular: No lower extremity edema, non tender, no erythema  Skin: Warm dry intact with no signs of infection or rash on extremities or on axial skeleton.  Abdomen: Soft nontender  Neuro: Cranial nerves II through XII are intact, neurovascularly intact in all extremities with 2+ DTRs and 2+ pulses.  Lymph: No lymphadenopathy of posterior or anterior cervical chain or axillae bilaterally.  Gait normal with good balance and coordination.  MSK:  Non tender with full range of motion and good stability and symmetric strength and tone of , elbows, wrist, hip, knee and ankles bilaterally.  Shoulder: Right Inspection reveals no abnormalities, atrophy or asymmetry. Palpation is normal with no tenderness over AC joint or bicipital groove. Decreased range of motion lacking the last 5 of internal rotation Rotator cuff strength normal throughout. Positive signs of impingement Speeds and Yergason's tests normal. No labral pathology noted with negative Obrien's, negative clunk and good stability. Normal scapular function observed. No painful arc and no drop arm sign. No apprehension sign      Back Exam:  Inspection: . 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 Patient does have spasming of the trapezius and rhomboids.  Motion: Flexion 30 deg, Extension 25 deg, Side Bending to 25 deg bilaterally,  Rotation to 25 deg bilaterally much tighter than previous. SLR laying: Negative  XSLR laying: Negative  Palpable tenderness: increase tenderness of low back paraspinal musculature of lumbar.  FABER: negative. Sensory change: Gross sensation intact to all lumbar and sacral dermatomes.  Reflexes: 2+  at both patellar tendons, 2+ at achilles tendons, Babinski's downgoing.  Strength at foot  Plantar-flexion: 5/5 Dorsi-flexion: 5/5 Eversion: 5/5 Inversion: 5/5  Leg strength  Quad:  5/5 Hamstring: 5/5 Hip flexor: 5/5 Hip abductors: 4/5  Gait unremarkable.  Osteopathic findings Cervical Neutral Thoracic T3 extended rotated and side bent left with inhaled third rib T6 extended rotated and side bent right T9 extended rotated and side bent left Lumbar L2 flexed rotated and side bent right      Impression and Recommendations:     This case required medical decision making of moderate complexity.

## 2014-08-20 NOTE — Assessment & Plan Note (Signed)
Encouraged heart healthy diet, increase exercise, avoid trans fats, consider a krill oil cap daily 

## 2014-08-20 NOTE — Assessment & Plan Note (Signed)
She has had this pain for quite some time. Patient continues to have some difficulty. Patient did have a refill of anti-inflammatory's and will start on nitroglycerin. Patient warned the potential side effects. We discussed icing regimen and continuing home exercises. If patient does not make any significant improvement in 2-4 weeks I would think advance imaging could be warranted. In March 2015 were unremarkable.

## 2014-08-20 NOTE — Assessment & Plan Note (Signed)
Well controlled, no changes to meds. Encouraged heart healthy diet such as the DASH diet and exercise as tolerated.  °

## 2014-08-23 ENCOUNTER — Other Ambulatory Visit: Payer: Commercial Managed Care - PPO

## 2014-08-24 ENCOUNTER — Other Ambulatory Visit: Payer: Commercial Managed Care - PPO

## 2014-08-29 ENCOUNTER — Other Ambulatory Visit (INDEPENDENT_AMBULATORY_CARE_PROVIDER_SITE_OTHER): Payer: Commercial Managed Care - PPO

## 2014-08-29 DIAGNOSIS — E785 Hyperlipidemia, unspecified: Secondary | ICD-10-CM | POA: Diagnosis not present

## 2014-08-29 DIAGNOSIS — E119 Type 2 diabetes mellitus without complications: Secondary | ICD-10-CM | POA: Diagnosis not present

## 2014-08-29 DIAGNOSIS — R000 Tachycardia, unspecified: Secondary | ICD-10-CM | POA: Diagnosis not present

## 2014-08-29 DIAGNOSIS — E669 Obesity, unspecified: Secondary | ICD-10-CM

## 2014-08-29 DIAGNOSIS — E1169 Type 2 diabetes mellitus with other specified complication: Secondary | ICD-10-CM

## 2014-08-29 DIAGNOSIS — I10 Essential (primary) hypertension: Secondary | ICD-10-CM | POA: Diagnosis not present

## 2014-08-29 LAB — COMPREHENSIVE METABOLIC PANEL
ALT: 26 U/L (ref 0–53)
AST: 31 U/L (ref 0–37)
Albumin: 3.8 g/dL (ref 3.5–5.2)
Alkaline Phosphatase: 72 U/L (ref 39–117)
BILIRUBIN TOTAL: 0.6 mg/dL (ref 0.2–1.2)
BUN: 14 mg/dL (ref 6–23)
CO2: 26 mEq/L (ref 19–32)
CREATININE: 1.02 mg/dL (ref 0.40–1.50)
Calcium: 9.8 mg/dL (ref 8.4–10.5)
Chloride: 100 mEq/L (ref 96–112)
GFR: 88.21 mL/min (ref >60.00)
Glucose, Bld: 169 mg/dL — ABNORMAL HIGH (ref 70–99)
Potassium: 3.7 mEq/L (ref 3.5–5.1)
Sodium: 138 mEq/L (ref 135–145)
Total Protein: 7.6 g/dL (ref 6.0–8.3)

## 2014-08-29 LAB — LIPID PANEL
Cholesterol: 153 mg/dL (ref 0–200)
HDL: 33.2 mg/dL — ABNORMAL LOW (ref >39.00)
LDL CALC: 89 mg/dL (ref 0–99)
NonHDL: 119.87
TRIGLYCERIDES: 153 mg/dL — AB (ref 0.0–149.0)
Total CHOL/HDL Ratio: 5
VLDL: 30.6 mg/dL (ref 0.0–40.0)

## 2014-08-29 LAB — CBC
HCT: 47.4 % (ref 39.0–52.0)
Hemoglobin: 16 g/dL (ref 13.0–17.0)
MCHC: 33.8 g/dL (ref 30.0–36.0)
MCV: 87.9 fl (ref 78.0–100.0)
Platelets: 251 10*3/uL (ref 150.0–400.0)
RBC: 5.4 Mil/uL (ref 4.22–5.81)
RDW: 13.4 % (ref 11.5–15.5)
WBC: 10 10*3/uL (ref 4.0–10.5)

## 2014-08-29 LAB — TSH: TSH: 0.66 u[IU]/mL (ref 0.35–4.50)

## 2014-08-29 LAB — HEMOGLOBIN A1C: HEMOGLOBIN A1C: 8.6 % — AB (ref 4.6–6.5)

## 2014-08-30 ENCOUNTER — Encounter: Payer: Self-pay | Admitting: Family Medicine

## 2014-08-30 ENCOUNTER — Ambulatory Visit (INDEPENDENT_AMBULATORY_CARE_PROVIDER_SITE_OTHER): Payer: Commercial Managed Care - PPO | Admitting: Family Medicine

## 2014-08-30 VITALS — BP 108/68 | HR 78 | Temp 98.1°F | Ht 74.0 in | Wt 367.5 lb

## 2014-08-30 DIAGNOSIS — Z87442 Personal history of urinary calculi: Secondary | ICD-10-CM

## 2014-08-30 DIAGNOSIS — E119 Type 2 diabetes mellitus without complications: Secondary | ICD-10-CM | POA: Diagnosis not present

## 2014-08-30 DIAGNOSIS — I10 Essential (primary) hypertension: Secondary | ICD-10-CM | POA: Diagnosis not present

## 2014-08-30 DIAGNOSIS — E1169 Type 2 diabetes mellitus with other specified complication: Secondary | ICD-10-CM

## 2014-08-30 DIAGNOSIS — N200 Calculus of kidney: Secondary | ICD-10-CM

## 2014-08-30 DIAGNOSIS — E669 Obesity, unspecified: Secondary | ICD-10-CM

## 2014-08-30 DIAGNOSIS — M546 Pain in thoracic spine: Secondary | ICD-10-CM

## 2014-08-30 DIAGNOSIS — E785 Hyperlipidemia, unspecified: Secondary | ICD-10-CM | POA: Diagnosis not present

## 2014-08-30 DIAGNOSIS — R000 Tachycardia, unspecified: Secondary | ICD-10-CM

## 2014-08-30 DIAGNOSIS — G4733 Obstructive sleep apnea (adult) (pediatric): Secondary | ICD-10-CM

## 2014-08-30 MED ORDER — GLIMEPIRIDE 2 MG PO TABS: 2.0000 mg | ORAL_TABLET | Freq: Every day | ORAL | Status: DC

## 2014-08-30 MED ORDER — VENLAFAXINE HCL ER 75 MG PO CP24: 75.0000 mg | ORAL_CAPSULE | Freq: Every day | ORAL | Status: DC

## 2014-08-30 NOTE — Patient Instructions (Signed)

## 2014-08-30 NOTE — Progress Notes (Signed)
Pre visit review using our clinic review tool, if applicable. No additional management support is needed unless otherwise documented below in the visit note. 

## 2014-08-30 NOTE — Progress Notes (Signed)
Patient ID: Colin Bennett, male   DOB: 09-24-1980, 34 y.o.   MRN: [id number]   [name]  male [id number] Aug 31, 1980 34 y.o. 08/30/2014      Progress Note-Follow Up  Subjective   HPI  Patient is in today for follow up on numerous concerns. Was in ER in [place] with right sided flank pain, suprapubic pain, n/v, hematuria malaise on Saturday and diagnosed with a kidney stone. Is feeling much better today. Still some mild discomfort but no other persistent symptoms. Sugars continue to run high. No other acute illness. Denies CP/palp/SOB/HA/congestion/fevers/GI c/o. Taking meds as prescribed Chief Complaint  Patient presents with  . Follow-up    3 month    Past Medical History  Diagnosis Date  . Kidney stones   . Diabetes mellitus   . Bipolar 1 disorder   . HTN (hypertension) 05/10/2012  . Preventative health care 05/14/2012  . Renal lithiasis 08/10/2012  . Bipolar disorder, unspecified 08/10/2012  . Obesity, unspecified 11/09/2012  . Pain, joint, shoulder, right 04/09/2013  . Diabetes mellitus type 2 in obese 01/26/2014  . Diabetes mellitus type 2 in obese 06/10/2014  . Nephrolithiasis 06/10/2014    No past surgical history on file.  Family History  Problem Relation Age of Onset  . Hypertension Other   . Diabetes Other     Social History   Social History  . Marital Status: Married    Spouse Name: N/A  . Number of Children: 0  . Years of Education: N/A   Occupational History  . Collateral recovery    Social History Main Topics  . Smoking status: Never Smoker   . Smokeless tobacco: Never Used  . Alcohol Use: No  . Drug Use: No  . Sexual Activity: Not on file   Other Topics Concern  . Not on file   Social History Narrative    Current Outpatient Prescriptions on File Prior to Visit  Medication Sig Dispense Refill  . baclofen (LIORESAL) 10 MG tablet Take 1 tablet (10 mg total) by mouth 3 (three) times daily. 30 each 0  . lamoTRIgine (LAMICTAL) 100 MG tablet  Take 0.5 tablets (50 mg total) by mouth daily. 45 tablet 1  . lisinopril (PRINIVIL,ZESTRIL) 5 MG tablet Take 1 tablet (5 mg total) by mouth daily. 90 tablet 3  . meloxicam (MOBIC) 15 MG tablet Take 1 tablet (15 mg total) by mouth daily. 30 tablet 0  . metFORMIN (GLUCOPHAGE) 500 MG tablet Take 2 tablets (1,000 mg total) by mouth 2 (two) times daily with a meal. 180 tablet 2  . metoprolol succinate (TOPROL-XL) 50 MG 24 hr tablet Take 1 tablet (50 mg total) by mouth daily. Take with or immediately following a meal. 30 tablet 3  . mupirocin ointment (BACTROBAN) 2 % Place 1 application into the nose daily as needed. Epistaxis/dry nose 22 g 1  . pravastatin (PRAVACHOL) 10 MG tablet Take 1 tablet (10 mg total) by mouth daily. 90 tablet 2  . sitaGLIPtin (JANUVIA) 100 MG tablet Take 1 tablet (100 mg total) by mouth daily. 30 tablet 3  . venlafaxine XR (EFFEXOR XR) 75 MG 24 hr capsule Take 1 capsule (75 mg total) by mouth daily with breakfast. 30 capsule 3   No current facility-administered medications on file prior to visit.    No Known Allergies  Review of Systems  Review of Systems  Constitutional: Negative for fever and malaise/fatigue.  HENT: Negative for congestion.   Eyes: Negative for discharge.  Respiratory: Negative  for shortness of breath.   Cardiovascular: Negative for chest pain, palpitations and leg swelling.  Gastrointestinal: Positive for abdominal pain. Negative for nausea.  Genitourinary: Positive for dysuria, hematuria and flank pain.       Recently passed kidney stone   Musculoskeletal: Positive for back pain. Negative for falls.  Skin: Negative for rash.  Neurological: Negative for loss of consciousness and headaches.  Endo/Heme/Allergies: Negative for environmental allergies.  Psychiatric/Behavioral: Negative for depression. The patient is not nervous/anxious.     Objective  Filed Vitals:   08/30/14 1028  BP: 108/68  Pulse: 105  Temp: 98.1 F (36.7 C)  TempSrc:  Oral  Height: 6\' 2"  (1.88 m)  Weight: 367 lb 8 oz (166.697 kg)  SpO2: 95%   Body mass index is 47.16 kg/(m^2).  Physical Exam  Physical Exam  Lab Results  Component Value Date   TSH 0.66 08/29/2014   Lab Results  Component Value Date   WBC 10.0 08/29/2014   HGB 16.0 08/29/2014   HCT 47.4 08/29/2014   MCV 87.9 08/29/2014   PLT 251.0 08/29/2014   Lab Results  Component Value Date   GFR 88.21 08/29/2014   Lab Results  Component Value Date   CHOL 153 08/29/2014   Lab Results  Component Value Date   HDL 33.20* 08/29/2014   Lab Results  Component Value Date   LDLCALC 89 08/29/2014   Lab Results  Component Value Date   TRIG 153.0* 08/29/2014   Lab Results  Component Value Date   CHOLHDL 5 08/29/2014   Lab Results  Component Value Date   HGBA1C 8.6* 08/29/2014      Assessment & Plan  Renal lithiasis Was in the ER at [facility] on Saturday with a right sided stone, had pain, hematuria but feels much better today. He is set up with [facility] and will stay well hydrated. Has pain meds, phenergan for n/v and Flomax  HTN (hypertension) Well controlled, no changes to meds. Encouraged heart healthy diet such as the DASH diet and exercise as tolerated.   OSA (obstructive sleep apnea) Using CPAP routinely  Diabetes mellitus type 2 in obese Still not well controlled, he agrees to referral to endocrinology for management.   Obesity Encouraged DASH diet, decrease po intake and increase exercise as tolerated. Needs 7-8 hours of sleep nightly. Avoid trans fats, eat small, frequent meals every 4-5 hours with lean proteins, complex carbs and healthy fats. Minimize simple carbs, GMO foods.

## 2014-08-31 ENCOUNTER — Ambulatory Visit: Payer: Commercial Managed Care - PPO | Admitting: Family Medicine

## 2014-09-13 ENCOUNTER — Ambulatory Visit (INDEPENDENT_AMBULATORY_CARE_PROVIDER_SITE_OTHER): Payer: Commercial Managed Care - PPO | Admitting: Endocrinology

## 2014-09-13 ENCOUNTER — Encounter: Payer: Self-pay | Admitting: Endocrinology

## 2014-09-13 VITALS — BP 119/72 | HR 81 | Temp 98.0°F | Ht 74.0 in | Wt 369.0 lb

## 2014-09-13 DIAGNOSIS — E1169 Type 2 diabetes mellitus with other specified complication: Secondary | ICD-10-CM

## 2014-09-13 DIAGNOSIS — E119 Type 2 diabetes mellitus without complications: Secondary | ICD-10-CM

## 2014-09-13 DIAGNOSIS — E669 Obesity, unspecified: Secondary | ICD-10-CM | POA: Diagnosis not present

## 2014-09-13 MED ORDER — CANAGLIFLOZIN 300 MG PO TABS: 300.0000 mg | ORAL_TABLET | Freq: Every day | ORAL | Status: DC

## 2014-09-13 NOTE — Progress Notes (Signed)
Subjective:    Patient ID: Colin Bennett, male    DOB: 04/24/1980, 34 y.o.   MRN: [id number]  HPI pt states DM was dx'ed in 2011; he has mild if any neuropathy of the lower extremities; he is unaware of any associated chronic complications; he has never been on insulin; pt says his diet and exercise are "ok;" he has never had pancreatitis, severe hypoglycemia or DKA.   He says cbg's vary from 170-270 fasting.   Past Medical History  Diagnosis Date  . Kidney stones   . Diabetes mellitus   . Bipolar 1 disorder   . HTN (hypertension) 05/10/2012  . Preventative health care 05/14/2012  . Renal lithiasis 08/10/2012  . Bipolar disorder, unspecified 08/10/2012  . Obesity, unspecified 11/09/2012  . Pain, joint, shoulder, right 04/09/2013  . Diabetes mellitus type 2 in obese 01/26/2014  . Diabetes mellitus type 2 in obese 06/10/2014  . Nephrolithiasis 06/10/2014    No past surgical history on file.  Social History   Social History  . Marital Status: Married    Spouse Name: N/A  . Number of Children: 0  . Years of Education: N/A   Occupational History  . Collateral recovery    Social History Main Topics  . Smoking status: Never Smoker   . Smokeless tobacco: Never Used  . Alcohol Use: No  . Drug Use: No  . Sexual Activity: Not on file   Other Topics Concern  . Not on file   Social History Narrative    Current Outpatient Prescriptions on File Prior to Visit  Medication Sig Dispense Refill  . baclofen (LIORESAL) 10 MG tablet Take 1 tablet (10 mg total) by mouth 3 (three) times daily. 30 each 0  . glimepiride (AMARYL) 2 MG tablet Take 1 tablet (2 mg total) by mouth daily before breakfast. 30 tablet 3  . lamoTRIgine (LAMICTAL) 100 MG tablet Take 0.5 tablets (50 mg total) by mouth daily. 45 tablet 1  . lisinopril (PRINIVIL,ZESTRIL) 5 MG tablet Take 1 tablet (5 mg total) by mouth daily. 90 tablet 3  . meloxicam (MOBIC) 15 MG tablet Take 1 tablet (15 mg total) by mouth daily. 30 tablet 0    . metFORMIN (GLUCOPHAGE) 500 MG tablet Take 2 tablets (1,000 mg total) by mouth 2 (two) times daily with a meal. 180 tablet 2  . metoprolol succinate (TOPROL-XL) 50 MG 24 hr tablet Take 1 tablet (50 mg total) by mouth daily. Take with or immediately following a meal. 30 tablet 3  . mupirocin ointment (BACTROBAN) 2 % Place 1 application into the nose daily as needed. Epistaxis/dry nose 22 g 1  . pravastatin (PRAVACHOL) 10 MG tablet Take 1 tablet (10 mg total) by mouth daily. 90 tablet 2  . sitaGLIPtin (JANUVIA) 100 MG tablet Take 1 tablet (100 mg total) by mouth daily. 30 tablet 3  . venlafaxine XR (EFFEXOR XR) 75 MG 24 hr capsule Take 1 capsule (75 mg total) by mouth daily with breakfast. 30 capsule 3   No current facility-administered medications on file prior to visit.    No Known Allergies  Family History  Problem Relation Age of Onset  . Hypertension Other   . Diabetes Other     BP 119/72 mmHg  Pulse 81  Temp(Src) 98 F (36.7 C) (Oral)  Ht 6\' 2"  (1.88 m)  Wt 369 lb (167.377 kg)  BMI 47.36 kg/m2  SpO2 95%  Review of Systems denies weight loss, chest pain, sob, n/v, urinary frequency,  muscle cramps, excessive diaphoresis, depression, cold intolerance, rhinorrhea, and easy bruising.  He has slight blurry vision and chronic headache.     Objective:   Physical Exam VS: see vs page GEN: no distress.  Morbid obesity HEAD: head: no deformity eyes: no periorbital swelling, no proptosis external nose and ears are normal mouth: no lesion seen NECK: supple, thyroid is not enlarged CHEST WALL: no deformity LUNGS: clear to auscultation BREASTS:  No gynecomastia CV: reg rate and rhythm, no murmur ABD: abdomen is soft, nontender.  no hepatosplenomegaly.  not distended.  no hernia MUSCULOSKELETAL: muscle bulk and strength are grossly normal.  no obvious joint swelling.  gait is normal and steady EXTEMITIES: no deformity.  no ulcer on the feet.  feet are of normal color and temp.   Trace bilat leg edema PULSES: dorsalis pedis intact bilat.  no carotid bruit NEURO:  cn 2-12 grossly intact.   readily moves all 4's.  sensation is intact to touch on the feet SKIN:  Normal texture and temperature.  No rash or suspicious lesion is visible.   NODES:  None palpable at the neck PSYCH: alert, well-oriented.  Does not appear anxious nor depressed.   Lab Results  Component Value Date   HGBA1C 8.6* 08/29/2014   i personally reviewed electrocardiogram tracing (07/07/14): Sinus Tachycardia  I have reviewed outside records (08/30/14 ov): glimepiride was re-added.      Assessment & Plan:  Obesity: severe exacerbation: I advised weight loss surgery. DM: he needs surgery.  Patient is advised the following: Patient Instructions  good diet and exercise significantly improve the control of your diabetes.  please let me know if you wish to be referred to a dietician.  high blood sugar is very risky to your health.  you should see an eye doctor and dentist every year.  It is very important to get all recommended vaccinations.  controlling your blood pressure and cholesterol drastically reduces the damage diabetes does to your body.  Those who smoke should quit.  please discuss these with your doctor.  check your blood sugar once a day.  vary the time of day when you check, between before the 3 meals, and at bedtime.  also check if you have symptoms of your blood sugar being too high or too low.  please keep a record of the readings and bring it to your next appointment here.  You can write it on any piece of paper.  please call us sooner if your blood sugar goes below 70, or if you have a lot of readings over 200.  Please let me know if you decide to consider weight loss surgery.   At our office, we are fortunate to have two specialists who are happy to help you:   Leonia Reader, RN, CDE, is a diabetes educator and pump trainer.  She is here on Monday mornings, and all day Tuesday and  Wednesday.  She is can help you with low blood sugar avoidance and treatment, injecting insulin, sick day management, and others.   Antonieta Iba, RD is our dietician.  She is here all day Thursday and Friday.  She can advise you about a healthy diet.  She can also help you about a variety of special diabetes situations, such as shift work, Actor, gluten-free, diet for kidney patients, traveling with diabetes, and help for those who need to gain weight.   i have sent a prescription to your pharmacy, to add invokana.  Drink plenty of fluids  while on this.  Please come back for a follow-up appointment in 2 months.

## 2014-09-13 NOTE — Patient Instructions (Addendum)
good diet and exercise significantly improve the control of your diabetes.  please let me know if you wish to be referred to a dietician.  high blood sugar is very risky to your health.  you should see an eye doctor and dentist every year.  It is very important to get all recommended vaccinations.  controlling your blood pressure and cholesterol drastically reduces the damage diabetes does to your body.  Those who smoke should quit.  please discuss these with your doctor.  check your blood sugar once a day.  vary the time of day when you check, between before the 3 meals, and at bedtime.  also check if you have symptoms of your blood sugar being too high or too low.  please keep a record of the readings and bring it to your next appointment here.  You can write it on any piece of paper.  please call us sooner if your blood sugar goes below 70, or if you have a lot of readings over 200.  Please let me know if you decide to consider weight loss surgery.   At our office, we are fortunate to have two specialists who are happy to help you:   Leonia Reader, RN, CDE, is a diabetes educator and pump trainer.  She is here on Monday mornings, and all day Tuesday and Wednesday.  She is can help you with low blood sugar avoidance and treatment, injecting insulin, sick day management, and others.   Antonieta Iba, RD is our dietician.  She is here all day Thursday and Friday.  She can advise you about a healthy diet.  She can also help you about a variety of special diabetes situations, such as shift work, Actor, gluten-free, diet for kidney patients, traveling with diabetes, and help for those who need to gain weight.   i have sent a prescription to your pharmacy, to add invokana.  Drink plenty of fluids while on this.  Please come back for a follow-up appointment in 2 months.

## 2014-09-16 ENCOUNTER — Encounter: Payer: Self-pay | Admitting: Family Medicine

## 2014-09-16 NOTE — Assessment & Plan Note (Signed)
Well controlled, no changes to meds. Encouraged heart healthy diet such as the DASH diet and exercise as tolerated.  °

## 2014-09-16 NOTE — Assessment & Plan Note (Signed)
Was in the ER at [facility] on Saturday with a right sided stone, had pain, hematuria but feels much better today. He is set up with [facility] and will stay well hydrated. Has pain meds, phenergan for n/v and Flomax

## 2014-09-16 NOTE — Assessment & Plan Note (Signed)
Still not well controlled, he agrees to referral to endocrinology for management.

## 2014-09-16 NOTE — Assessment & Plan Note (Signed)
Using CPAP routinely 

## 2014-09-16 NOTE — Assessment & Plan Note (Signed)
Encouraged DASH diet, decrease po intake and increase exercise as tolerated. Needs 7-8 hours of sleep nightly. Avoid trans fats, eat small, frequent meals every 4-5 hours with lean proteins, complex carbs and healthy fats. Minimize simple carbs, GMO foods. 

## 2014-09-16 NOTE — Assessment & Plan Note (Deleted)
Neck pain, Dr Trenton Gammon performs injections.

## 2014-09-17 ENCOUNTER — Ambulatory Visit: Payer: Self-pay | Admitting: Family Medicine

## 2014-09-18 ENCOUNTER — Encounter: Payer: Self-pay | Admitting: Family Medicine

## 2014-09-18 ENCOUNTER — Ambulatory Visit (INDEPENDENT_AMBULATORY_CARE_PROVIDER_SITE_OTHER): Payer: Commercial Managed Care - PPO | Admitting: Family Medicine

## 2014-09-18 VITALS — BP 104/76 | HR 94 | Ht 74.0 in | Wt 368.0 lb

## 2014-09-18 DIAGNOSIS — G2589 Other specified extrapyramidal and movement disorders: Secondary | ICD-10-CM | POA: Diagnosis not present

## 2014-09-18 DIAGNOSIS — M9908 Segmental and somatic dysfunction of rib cage: Secondary | ICD-10-CM

## 2014-09-18 DIAGNOSIS — M9902 Segmental and somatic dysfunction of thoracic region: Secondary | ICD-10-CM

## 2014-09-18 DIAGNOSIS — M7541 Impingement syndrome of right shoulder: Secondary | ICD-10-CM

## 2014-09-18 DIAGNOSIS — M9903 Segmental and somatic dysfunction of lumbar region: Secondary | ICD-10-CM

## 2014-09-18 DIAGNOSIS — M999 Biomechanical lesion, unspecified: Secondary | ICD-10-CM

## 2014-09-18 NOTE — Patient Instructions (Signed)
Good to see you I am happy and you are making progress continue the nitro and consider alternating shoulders Ice when you need it.  Yoga 1-2 times a week at first.  See me again in 4 weeks.

## 2014-09-18 NOTE — Progress Notes (Signed)
Pre visit review using our clinic review tool, if applicable. No additional management support is needed unless otherwise documented below in the visit note. 

## 2014-09-18 NOTE — Assessment & Plan Note (Signed)
Patient is doing better with this overall. Patient will continue with the nitro patches. We discussed monitoring for side effects. Discussed icing regimen.

## 2014-09-18 NOTE — Assessment & Plan Note (Signed)
Decision today to treat with OMT was based on Physical Exam  After verbal consent patient was treated with HVLA, ME, FPR techniques in thoracic, rib,, lumbar areas  Patient tolerated the procedure well with improvement in symptoms  Patient given exercises, stretches and lifestyle modifications  See medications in patient instructions if given  Patient will follow up in 4-6 week        

## 2014-09-18 NOTE — Progress Notes (Signed)
[facility] [address] [place], [place] [address] Phone: [phone] Subjective:    CC: Back painfollow-up, right shoulder pain follow up  [name] Colin Bennett is a 34 y.o. male coming in with complaint of back pain. 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 Patient is doing the exercises intermittently. States that overall he does feel like he is doing somewhat better. Patient knows if he does do the exercises more he feels better. Patient is trying to stay active.  Patient does have a chronic rotator cuff syndrome of the right shoulder. Patient though is doing much better after last exam. An states that the nitroglycerin patches seem to be working with minimal headaches. No significant side effects.     Past medical history, social, surgical and family history all reviewed in electronic medical record.   Review of Systems: No headache, visual changes, nausea, vomiting, diarrhea, constipation, dizziness, abdominal pain, skin rash, fevers, chills, night sweats, weight loss, swollen lymph nodes, body aches, joint swelling, muscle aches, chest pain, shortness of breath, mood changes.   Objective Blood pressure 104/76, pulse 94, height 6\' 2"  (1.88 m), weight 368 lb (166.924 kg), SpO2 96 %.  General: No apparent distress alert and oriented x3 mood and affect normal, dressed appropriately. Morbidly obese HEENT: Pupils equal, extraocular movements intact  Respiratory: Patient's speak in full sentences and does not appear short of breath  Cardiovascular: No lower extremity edema, non tender, no erythema  Skin: Warm dry intact with no signs of infection or rash on extremities or on axial skeleton.  Abdomen: Soft nontender  Neuro: Cranial nerves II through XII are intact, neurovascularly intact in all extremities with 2+ DTRs and 2+ pulses.  Lymph: No lymphadenopathy of posterior or anterior cervical chain or  axillae bilaterally.  Gait normal with good balance and coordination.  MSK:  Non tender with full range of motion and good stability and symmetric strength and tone of , elbows, wrist, hip, knee and ankles bilaterally.  Shoulder: Right Inspection reveals no abnormalities, atrophy or asymmetry. Palpation is normal with no tenderness over AC joint or bicipital groove. Decreased range of motion lacking the last 5 of internal rotation Rotator cuff strength normal throughout. Minimal impingement Speeds and Yergason's tests normal. No labral pathology noted with negative Obrien's, negative clunk and good stability. Normal scapular function observed. No painful arc and no drop arm sign. No apprehension sign      Back Exam:  Inspection: . 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 Patient does have spasming of the trapezius and rhomboids.  Motion: Flexion 30 deg, Extension 25 deg, Side Bending to 25 deg bilaterally,  Rotation to 30 deg bilaterally . Mild improvement SLR laying: Negative  XSLR laying: Negative  Palpable tenderness: Less tightness and tenderness from previous exam FABER: negative. Sensory change: Gross sensation intact to all lumbar and sacral dermatomes.  Reflexes: 2+ at both patellar tendons, 2+ at achilles tendons, Babinski's downgoing.  Strength at foot  Plantar-flexion: 5/5 Dorsi-flexion: 5/5 Eversion: 5/5 Inversion: 5/5  Leg strength  Quad: 5/5 Hamstring: 5/5 Hip flexor: 5/5 Hip abductors: 4/5  Gait unremarkable.  Osteopathic findings Cervical C4 flexed rotated and side bent left Thoracic T3 extended rotated and side bent left with inhaled third rib T5 extended rotated and side bent right T7 extended rotated and side bent left Lumbar L2 flexed rotated and side bent right      Impression and Recommendations:  This case required medical decision making of moderate complexity.

## 2014-09-18 NOTE — Assessment & Plan Note (Signed)
Continued postural control. Continuing changes in posture.

## 2014-10-03 ENCOUNTER — Other Ambulatory Visit: Payer: Self-pay | Admitting: Family Medicine

## 2014-10-15 ENCOUNTER — Ambulatory Visit (INDEPENDENT_AMBULATORY_CARE_PROVIDER_SITE_OTHER): Payer: Commercial Managed Care - PPO | Admitting: Physician Assistant

## 2014-10-15 ENCOUNTER — Encounter: Payer: Self-pay | Admitting: Physician Assistant

## 2014-10-15 ENCOUNTER — Telehealth: Payer: Self-pay | Admitting: Physician Assistant

## 2014-10-15 ENCOUNTER — Telehealth: Payer: Self-pay | Admitting: Endocrinology

## 2014-10-15 VITALS — BP 110/70 | HR 92 | Temp 97.7°F | Resp 16 | Ht 74.0 in | Wt 367.4 lb

## 2014-10-15 DIAGNOSIS — B3742 Candidal balanitis: Secondary | ICD-10-CM | POA: Insufficient documentation

## 2014-10-15 LAB — POCT URINALYSIS DIPSTICK
BILIRUBIN UA: NEGATIVE
Blood, UA: NEGATIVE
Glucose, UA: POSITIVE
Ketones, UA: NEGATIVE
Leukocytes, UA: NEGATIVE
Nitrite, UA: NEGATIVE
PH UA: 6
PROTEIN UA: NEGATIVE
SPEC GRAV UA: 1.02
Urobilinogen, UA: 0.2

## 2014-10-15 MED ORDER — FLUCONAZOLE 150 MG PO TABS: 150.0000 mg | ORAL_TABLET | Freq: Once | ORAL | Status: DC

## 2014-10-15 MED ORDER — CLOTRIMAZOLE 1 % EX CREA: 1.0000 "application " | TOPICAL_CREAM | Freq: Two times a day (BID) | CUTANEOUS | Status: DC

## 2014-10-15 NOTE — Assessment & Plan Note (Signed)
Rx Diflucan. Rx Clotrimazole BID. Probiotic. Patient encouraged to speak to Endocrinologist about switching the Invokana as is most likely cause of symptoms -- urine dip with excess glucose. No findings worrisome for UTI.

## 2014-10-15 NOTE — Patient Instructions (Signed)
Take Diflucan as directed. Increase fluids and start a daily probiotic (Align, Culturelle, Digestive Advantage). Use the Clotrimazole cream twice daily until symptoms are resolved.  Call you Endocrinologist to discuss options other than the Invokana as it is the most likely culprit of your yeast infection.  Call or return to clinic if symptoms are not resolving.

## 2014-10-15 NOTE — Telephone Encounter (Signed)
Patient stated that the medication Invokana he taking has given him a yeast infection, he would like to know if he can lower the dosage, please advise

## 2014-10-15 NOTE — Progress Notes (Signed)
Pre visit review using our clinic review tool, if applicable. No additional management support is needed unless otherwise documented below in the visit note/SLS  

## 2014-10-15 NOTE — Progress Notes (Signed)
Patient presents to clinic today c/o itchy and painful corona of penis x 1.5 weeks with red and irritated rash. Denies sexually activity. Denies urinary symptoms. Is a diabetic and states was recently started on Invokana a few weeks ago.  Past Medical History  Diagnosis Date  . Kidney stones   . Diabetes mellitus   . Bipolar 1 disorder   . HTN (hypertension) 05/10/2012  . Preventative health care 05/14/2012  . Renal lithiasis 08/10/2012  . Bipolar disorder, unspecified 08/10/2012  . Obesity, unspecified 11/09/2012  . Pain, joint, shoulder, right 04/09/2013  . Diabetes mellitus type 2 in obese 01/26/2014  . Diabetes mellitus type 2 in obese 06/10/2014  . Nephrolithiasis 06/10/2014    Current Outpatient Prescriptions on File Prior to Visit  Medication Sig Dispense Refill  . baclofen (LIORESAL) 10 MG tablet Take 1 tablet (10 mg total) by mouth 3 (three) times daily. 30 each 0  . canagliflozin (INVOKANA) 300 MG TABS tablet Take 300 mg by mouth daily before breakfast. 30 tablet 11  . glimepiride (AMARYL) 2 MG tablet Take 1 tablet (2 mg total) by mouth daily before breakfast. 30 tablet 3  . JANUVIA 100 MG tablet TAKE ONE TABLET BY MOUTH ONCE DAILY 30 tablet 0  . lamoTRIgine (LAMICTAL) 100 MG tablet Take 0.5 tablets (50 mg total) by mouth daily. 45 tablet 1  . lisinopril (PRINIVIL,ZESTRIL) 5 MG tablet Take 1 tablet (5 mg total) by mouth daily. 90 tablet 3  . meloxicam (MOBIC) 15 MG tablet Take 1 tablet (15 mg total) by mouth daily. 30 tablet 0  . metFORMIN (GLUCOPHAGE) 500 MG tablet Take 2 tablets (1,000 mg total) by mouth 2 (two) times daily with a meal. 180 tablet 2  . metoprolol succinate (TOPROL-XL) 50 MG 24 hr tablet Take 1 tablet (50 mg total) by mouth daily. Take with or immediately following a meal. 30 tablet 3  . mupirocin ointment (BACTROBAN) 2 % Place 1 application into the nose daily as needed. Epistaxis/dry nose 22 g 1  . pravastatin (PRAVACHOL) 10 MG tablet Take 1 tablet (10 mg  total) by mouth daily. 90 tablet 2  . venlafaxine XR (EFFEXOR XR) 75 MG 24 hr capsule Take 1 capsule (75 mg total) by mouth daily with breakfast. 30 capsule 3   No current facility-administered medications on file prior to visit.    No Known Allergies  Family History  Problem Relation Age of Onset  . Hypertension Other   . Diabetes Other     Social History   Social History  . Marital Status: Married    Spouse Name: N/A  . Number of Children: 0  . Years of Education: N/A   Occupational History  . Collateral recovery    Social History Main Topics  . Smoking status: Never Smoker   . Smokeless tobacco: Never Used  . Alcohol Use: No  . Drug Use: No  . Sexual Activity: Not Asked   Other Topics Concern  . None   Social History Narrative    Review of Systems - See HPI.  All other ROS are negative.  BP 110/70 mmHg  Pulse 92  Temp(Src) 97.7 F (36.5 C) (Oral)  Resp 16  Ht 6\' 2"  (1.88 m)  Wt 367 lb 6 oz (166.64 kg)  BMI 47.15 kg/m2  SpO2 95%  Physical Exam  Constitutional: He is oriented to person, place, and time and well-developed, well-nourished, and in no distress.  Cardiovascular: Normal rate, regular rhythm, normal heart sounds and  intact distal pulses.   Pulmonary/Chest: Effort normal and breath sounds normal. No respiratory distress. He has no wheezes. He has no rales. He exhibits no tenderness.  Genitourinary: Penis exhibits edema.  Erythematous region of glans penis and penile shaft noted on exam, consistent with candidal infection. No phimosis or paraphimosis noted on exam.  Neurological: He is alert and oriented to person, place, and time.  Vitals reviewed.   Recent Results (from the past 2160 hour(s))  [facility] DIABETES EYE EXAM     Status: None   Collection Time: 07/26/14 12:00 AM  Result Value Ref Range   [facility] Diabetic Eye Exam No Retinopathy No Retinopathy    Comment: [facility]  TSH     Status: None   Collection Time: 08/29/14  1:07 PM    Result Value Ref Range   TSH 0.66 0.35 - 4.50 uIU/mL  CBC     Status: None   Collection Time: 08/29/14  1:07 PM  Result Value Ref Range   WBC 10.0 4.0 - 10.5 K/uL   RBC 5.40 4.22 - 5.81 Mil/uL   Platelets 251.0 150.0 - 400.0 K/uL   Hemoglobin 16.0 13.0 - 17.0 g/dL   HCT 47.4 39.0 - 52.0 %   MCV 87.9 78.0 - 100.0 fl   MCHC 33.8 30.0 - 36.0 g/dL   RDW 13.4 11.5 - 15.5 %  Comprehensive metabolic panel     Status: Abnormal   Collection Time: 08/29/14  1:07 PM  Result Value Ref Range   Sodium 138 135 - 145 mEq/L   Potassium 3.7 3.5 - 5.1 mEq/L   Chloride 100 96 - 112 mEq/L   CO2 26 19 - 32 mEq/L   Glucose, Bld 169 (H) 70 - 99 mg/dL   BUN 14 6 - 23 mg/dL   Creatinine, Ser 1.02 0.40 - 1.50 mg/dL   Total Bilirubin 0.6 0.2 - 1.2 mg/dL   Alkaline Phosphatase 72 39 - 117 U/L   AST 31 0 - 37 U/L   ALT 26 0 - 53 U/L   Total Protein 7.6 6.0 - 8.3 g/dL   Albumin 3.8 3.5 - 5.2 g/dL   Calcium 9.8 8.4 - 10.5 mg/dL   GFR 88.21 >60.00 mL/min  Lipid panel     Status: Abnormal   Collection Time: 08/29/14  1:07 PM  Result Value Ref Range   Cholesterol 153 0 - 200 mg/dL    Comment: ATP III Classification       Desirable:  < 200 mg/dL               Borderline High:  200 - 239 mg/dL          High:  > = 240 mg/dL   Triglycerides 153.0 (H) 0.0 - 149.0 mg/dL    Comment: Normal:  <150 mg/dLBorderline High:  150 - 199 mg/dL   HDL 33.20 (L) >39.00 mg/dL   VLDL 30.6 0.0 - 40.0 mg/dL   LDL Cholesterol 89 0 - 99 mg/dL   Total CHOL/HDL Ratio 5     Comment:                Men          Women1/2 Average Risk     3.4          3.3Average Risk          5.0          4.42X Average Risk          9.6  7.13X Average Risk          15.0          11.0                       NonHDL 119.87     Comment: NOTE:  Non-HDL goal should be 30 mg/dL higher than patient's LDL goal (i.e. LDL goal of < 70 mg/dL, would have non-HDL goal of < 100 mg/dL)  Hemoglobin A1c     Status: Abnormal   Collection Time: 08/29/14  1:07 PM   Result Value Ref Range   Hgb A1c MFr Bld 8.6 (H) 4.6 - 6.5 %    Comment: Glycemic Control Guidelines for People with Diabetes:Non Diabetic:  <6%Goal of Therapy: <7%Additional Action Suggested:  >8%     Assessment/Plan: Candidal balanitis Rx Diflucan. Rx Clotrimazole BID. Probiotic. Patient encouraged to speak to Endocrinologist about switching the Invokana as is most likely cause of symptoms -- urine dip with excess glucose. No findings worrisome for UTI.

## 2014-10-15 NOTE — Telephone Encounter (Signed)
Left a voicemail for the pt advising of note below. Requested call back if the pt would like to discuss.

## 2014-10-15 NOTE — Telephone Encounter (Signed)
Caller name: Shelly Relation to UD:THYH Call back number: Pharmacy:  Reason for call: States came in office and was seen by Einar Pheasant, got a rx of  clotrimazole (LOTRIMIN) 1 % cream, the pharmacist gave him the OTC and just wanted to know if it was the same thing. Please advise.

## 2014-10-15 NOTE — Telephone Encounter (Signed)
Ok, try reducing to half a pill per day

## 2014-10-15 NOTE — Addendum Note (Signed)
Addended by: Rockwell Germany on: 10/15/2014 09:14 AM   Modules accepted: Orders

## 2014-10-15 NOTE — Telephone Encounter (Signed)
See below and please advise, thanks!  

## 2014-10-16 ENCOUNTER — Encounter: Payer: Self-pay | Admitting: Family Medicine

## 2014-10-16 ENCOUNTER — Ambulatory Visit (INDEPENDENT_AMBULATORY_CARE_PROVIDER_SITE_OTHER): Payer: Commercial Managed Care - PPO | Admitting: Family Medicine

## 2014-10-16 VITALS — BP 126/82 | HR 94 | Wt 370.0 lb

## 2014-10-16 DIAGNOSIS — M546 Pain in thoracic spine: Secondary | ICD-10-CM

## 2014-10-16 DIAGNOSIS — M9903 Segmental and somatic dysfunction of lumbar region: Secondary | ICD-10-CM | POA: Diagnosis not present

## 2014-10-16 DIAGNOSIS — M216X1 Other acquired deformities of right foot: Secondary | ICD-10-CM | POA: Diagnosis not present

## 2014-10-16 DIAGNOSIS — M999 Biomechanical lesion, unspecified: Secondary | ICD-10-CM

## 2014-10-16 DIAGNOSIS — M9908 Segmental and somatic dysfunction of rib cage: Secondary | ICD-10-CM

## 2014-10-16 DIAGNOSIS — M216X9 Other acquired deformities of unspecified foot: Secondary | ICD-10-CM

## 2014-10-16 DIAGNOSIS — M9902 Segmental and somatic dysfunction of thoracic region: Secondary | ICD-10-CM | POA: Diagnosis not present

## 2014-10-16 HISTORY — DX: Other acquired deformities of unspecified foot: M21.6X9

## 2014-10-16 NOTE — Assessment & Plan Note (Signed)
Decision today to treat with OMT was based on Physical Exam  After verbal consent patient was treated with HVLA, ME, FPR techniques in thoracic, rib,, lumbar areas  Patient tolerated the procedure well with improvement in symptoms  Patient given exercises, stretches and lifestyle modifications  See medications in patient instructions if given  Patient will follow up in 3 week

## 2014-10-16 NOTE — Progress Notes (Signed)
[facility] [place], [place] [address] Phone: [phone] Subjective:    CC: Back painfollow-up, right shoulder pain follow up  Colin Bennett:[id number] JAIS Colin Bennett is a 34 y.o. male coming in with complaint of back pain. 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 Patient is doing the exercises intermittently. Has noticed for the last week and a half though he has noticed some increasing tightness. Nothing is changing his regular daily activity. Patient denies any numbness, any radiation down the legs. Patient states that it is just more of a tightness than anything else. In trying to continue with the same regimen.  She is complaining of a new problem. Patient is having right ankle pain. More on the lateral aspect of the ankle. Seems to be worse after repetitive activity. Sometimes can have a feeling of giving out on him. Patient has not fallen from it. Rates the severity of 7 out of 10. Sometimes has noticed maybe some mild swelling. Not stopping him from activities but makes him very cautious.       Past medical history, social, surgical and family history all reviewed in electronic medical record.   Review of Systems: No headache, visual changes, nausea, vomiting, diarrhea, constipation, dizziness, abdominal pain, skin rash, fevers, chills, night sweats, weight loss, swollen lymph nodes, body aches, joint swelling, muscle aches, chest pain, shortness of breath, mood changes.   Objective There were no vitals taken for this visit.  General: No apparent distress alert and oriented x3 mood and affect normal, dressed appropriately. Morbidly obese HEENT: Pupils equal, extraocular movements intact  Respiratory: Patient's speak in full sentences and does not appear short of breath  Cardiovascular: No lower extremity edema, non tender, no erythema  Skin: Warm dry intact with no signs of infection or rash on  extremities or on axial skeleton.  Abdomen: Soft nontender  Neuro: Cranial nerves II through XII are intact, neurovascularly intact in all extremities with 2+ DTRs and 2+ pulses.  Lymph: No lymphadenopathy of posterior or anterior cervical chain or axillae bilaterally.  Gait normal with good balance and coordination.  MSK:  Non tender with full range of motion and good stability and symmetric strength and tone of , elbows, wrist, hip, knee and ankles bilaterally.    Back Exam:  Inspection: . Mild increase in stiffness in kyphosis from previous exam.  Motion: Flexion 30 deg, Extension 25 deg, Side Bending to 25 deg bilaterally,  Rotation to 30 deg bilaterally . Continues to have stiffness SLR laying: Negative  XSLR laying: Negative  Palpable tenderness: Less tightness and tenderness from previous exam FABER: negative. Sensory change: Gross sensation intact to all lumbar and sacral dermatomes.  Reflexes: 2+ at both patellar tendons, 2+ at achilles tendons, Babinski's downgoing.  Strength at foot  Plantar-flexion: 5/5 Dorsi-flexion: 5/5 Eversion: 5/5 Inversion: 5/5  Leg strength  Quad: 5/5 Hamstring: 5/5 Hip flexor: 5/5 Hip abductors: 4/5  Gait unremarkable.  Osteopathic findings Cervical C4 flexed rotated and side bent left Thoracic T3 extended rotated and side bent left with inhaled third rib T5 extended rotated and side bent right T7 extended rotated and side bent left Lumbar L2 flexed rotated and side bent right  Ankle: Right No visible erythema or swelling. Significant oversupination of the hindfoot bilaterally right greater than left Range of motion is full in all directions. Strength is 5/5 in all directions. Stable lateral and medial ligaments; squeeze test and kleiger test unremarkable; Talar dome  nontender; No pain at base of 5th MT; No tenderness over cuboid; No tenderness over N spot or navicular prominence No tenderness on posterior aspects of lateral and medial  malleolus Tender over the peroneal tendons inferior to the lateral malleolus. Negative tarsal tunnel tinel's Able to walk 4 steps.  Procedure note [id number]; 15 minutes spent for Therapeutic exercises as stated in above notes.  This included exercises focusing on stretching, strengthening, with significant focus on eccentric aspects. - Ankle strengthening that included:  Basic range of motion exercises to allow proper full motion at ankle Stretching of the lower leg and hamstrings  Theraband exercises for the lower leg - inversion, eversion, dorsiflexion and plantarflexion each to be completed with a theraband Balance exercises to increase proprioception Weight bearing exercises to increase strength and balance  Proper technique shown and discussed handout in great detail with ATC.  All questions were discussed and answered.     Impression and Recommendations:     This case required medical decision making of moderate complexity.

## 2014-10-16 NOTE — Patient Instructions (Signed)
You were tight Stay active and do the exercises especially the range of motion.  For the ankle new exercises Go shoe shopping for more stability Try pennsaid twice daly on side of ankle Try to do ice bath of right ankle at night See me again in 3 weeks and if not better we will try injection.

## 2014-10-16 NOTE — Assessment & Plan Note (Signed)
Still secondary to patient's poor core strength I do think the patient is not doing the exercises as regularly as he should. Encourage him to be more regular on this. We discussed core strengthening as well as weight loss would be the most beneficial. Do think some outpatient oversupination and alignment is likely also contribute in. We did discuss the possibility of custom orthotics in the long run. We discussed doing the range of motion exercises on a more regular basis and patient will come back a little more frequent for manipulation and see me again in 3 weeks for further evaluation.

## 2014-10-16 NOTE — Telephone Encounter (Signed)
Per Melissa ok to use generic instead of name brand.

## 2014-10-16 NOTE — Assessment & Plan Note (Signed)
Patient is a supination of the feet bilaterally right greater than left and is likely contributing to more of a peroneal tendinitis on his right side. Patient given home exercises and work with Product/process development scientist today. We discussed icing regimen, topical anti-inflammatory's. Patient will try these changes but if continuing to have difficulty we may need to consider injection under ultrasound guidance secondary to patient's body habitus. Patient will come back again in 3 weeks for further evaluation. Encourage him to get more stability shoes as well. He could be a candidate for custom orthotics.

## 2014-11-09 ENCOUNTER — Ambulatory Visit (INDEPENDENT_AMBULATORY_CARE_PROVIDER_SITE_OTHER): Payer: Commercial Managed Care - PPO | Admitting: Family Medicine

## 2014-11-09 ENCOUNTER — Encounter: Payer: Self-pay | Admitting: Family Medicine

## 2014-11-09 VITALS — BP 112/82 | HR 74 | Ht 74.0 in | Wt 367.0 lb

## 2014-11-09 DIAGNOSIS — M9908 Segmental and somatic dysfunction of rib cage: Secondary | ICD-10-CM

## 2014-11-09 DIAGNOSIS — M999 Biomechanical lesion, unspecified: Secondary | ICD-10-CM

## 2014-11-09 DIAGNOSIS — M546 Pain in thoracic spine: Secondary | ICD-10-CM | POA: Diagnosis not present

## 2014-11-09 DIAGNOSIS — M9903 Segmental and somatic dysfunction of lumbar region: Secondary | ICD-10-CM

## 2014-11-09 DIAGNOSIS — M9902 Segmental and somatic dysfunction of thoracic region: Secondary | ICD-10-CM

## 2014-11-09 NOTE — Patient Instructions (Signed)
Good to see you Y-T-A on your stomach 2 seconds each position for 10 reps daily Keep watching how you lift Consider iron 65 mg daily Nex hi p flexor stretches See me again in 4 weeks.

## 2014-11-09 NOTE — Progress Notes (Signed)
[name] [place], [place] [address] Phone: [phone] Subjective:    CC: Back painfollow-up, right shoulder pain follow up  YCX:KGYJEHUDJS Colin Bennett is a 34 y.o. male coming in with complaint of back pain. 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 Then patient was doing very well but then had an exacerbation 3 weeks ago. Patient states he is been doing the exercises regularly and feeling like it is doing relatively well. No significant worsening pain. Seems to be more on the shoulders. Has been doing a lot more lifting.  Patient is having right ankle pain. Patient did have significant supination of the hindfoot bilaterally right greater than left. Patient was to get more stability in his shoes and we did discuss some over-the-counter orthotics. Patient given some exercises to try to strengthen the ankle. Patient states it is feeling significantly better. Has not had any more of the subluxation feelings that he was having previously. Patient states that some mild discomfort when he is on his feet a lot but nothing like it was performed.       Past medical history, social, surgical and family history all reviewed in electronic medical record.   Review of Systems: No headache, visual changes, nausea, vomiting, diarrhea, constipation, dizziness, abdominal pain, skin rash, fevers, chills, night sweats, weight loss, swollen lymph nodes, body aches, joint swelling, muscle aches, chest pain, shortness of breath, mood changes.   Objective Blood pressure 112/82, pulse 74, height 6\' 2"  (1.88 m), weight 367 lb (166.47 kg), SpO2 96 %.  General: No apparent distress alert and oriented x3 mood and affect normal, dressed appropriately. Morbidly obese HEENT: Pupils equal, extraocular movements intact  Respiratory: Patient's speak in full sentences and does not appear short of breath  Cardiovascular: No lower  extremity edema, non tender, no erythema  Skin: Warm dry intact with no signs of infection or rash on extremities or on axial skeleton.  Abdomen: Soft nontender  Neuro: Cranial nerves II through XII are intact, neurovascularly intact in all extremities with 2+ DTRs and 2+ pulses.  Lymph: No lymphadenopathy of posterior or anterior cervical chain or axillae bilaterally.  Gait normal with good balance and coordination.  MSK:  Non tender with full range of motion and good stability and symmetric strength and tone of , elbows, wrist, hip, knee and ankles bilaterally.    Back Exam:  Inspection: . Dome moderately stiff with increasing kyphosis. Motion: Flexion 30 deg, Extension 25 deg, Side Bending to 25 deg bilaterally,  Rotation to 30 deg bilaterally . Continues to have stiffness SLR laying: Negative  XSLR laying: Negative  Palpable tenderness: Continued improvement with less tightness FABER: negative. Sensory change: Gross sensation intact to all lumbar and sacral dermatomes.  Reflexes: 2+ at both patellar tendons, 2+ at achilles tendons, Babinski's downgoing.  Strength at foot  Plantar-flexion: 5/5 Dorsi-flexion: 5/5 Eversion: 5/5 Inversion: 5/5  Leg strength  Quad: 5/5 Hamstring: 5/5 Hip flexor: 5/5 Hip abductors: 4/5  Gait unremarkable.  Osteopathic findings Cervical C4 flexed rotated and side bent left Thoracic T3 extended rotated and side bent left with inhaled third rib T5 extended rotated and side bent right T7 extended rotated and side bent left Lumbar L2 flexed rotated and side bent right  Ankle: Right No visible erythema or swelling. Significant oversupination of the hindfoot bilaterally right greater than left Range of motion is full in all directions. Strength is 5/5 in all directions. Stable lateral  and medial ligaments; squeeze test and kleiger test unremarkable; Talar dome nontender; No pain at base of 5th MT; No tenderness over cuboid; No tenderness over N spot  or navicular prominence No tenderness on posterior aspects of lateral and medial malleolus Nontender over the peroneal tendons Negative tarsal tunnel tinel's Able to walk 4 steps.  .     Impression and Recommendations:     This case required medical decision making of moderate complexity.

## 2014-11-09 NOTE — Assessment & Plan Note (Signed)
Still secondary to patient's muscle imbalances as well as core stabilization. Encourage him to do more weight loss. We discussed ergonomics are up-to-date. Discussed icing regimen. Patient is going to try to make different changes and come back and see me in 3-4 weeks for further evaluation and treatment.

## 2014-11-09 NOTE — Progress Notes (Signed)
Pre visit review using our clinic review tool, if applicable. No additional management support is needed unless otherwise documented below in the visit note. 

## 2014-11-09 NOTE — Assessment & Plan Note (Signed)
Decision today to treat with OMT was based on Physical Exam  After verbal consent patient was treated with HVLA, ME, FPR techniques in thoracic, rib,, lumbar areas  Patient tolerated the procedure well with improvement in symptoms  Patient given exercises, stretches and lifestyle modifications  See medications in patient instructions if given  Patient will follow up in 3 week

## 2014-11-11 ENCOUNTER — Other Ambulatory Visit: Payer: Self-pay | Admitting: Family Medicine

## 2014-11-13 ENCOUNTER — Ambulatory Visit: Payer: Commercial Managed Care - PPO | Admitting: Endocrinology

## 2014-11-15 ENCOUNTER — Telehealth: Payer: Self-pay | Admitting: Endocrinology

## 2014-11-15 NOTE — Telephone Encounter (Signed)
Patient no showed today's appt. Please advise on how to follow up. °A. No follow up necessary. °B. Follow up urgent. Contact patient immediately. °C. Follow up necessary. Contact patient and schedule visit in ___ days. °D. Follow up advised. Contact patient and schedule visit in ____weeks. ° °

## 2014-11-18 NOTE — Telephone Encounter (Signed)
Follow up advised. Contact patient and schedule visit in 2 weeks. 

## 2014-11-19 NOTE — Telephone Encounter (Signed)
Colin Bennett, Could you please contact this patient to reschedule.  Thanks!

## 2014-11-28 ENCOUNTER — Ambulatory Visit (INDEPENDENT_AMBULATORY_CARE_PROVIDER_SITE_OTHER): Payer: Commercial Managed Care - PPO | Admitting: Endocrinology

## 2014-11-28 VITALS — BP 138/88 | HR 107 | Temp 98.0°F | Ht 74.0 in | Wt 363.0 lb

## 2014-11-28 DIAGNOSIS — E1169 Type 2 diabetes mellitus with other specified complication: Secondary | ICD-10-CM

## 2014-11-28 DIAGNOSIS — E669 Obesity, unspecified: Secondary | ICD-10-CM

## 2014-11-28 DIAGNOSIS — E119 Type 2 diabetes mellitus without complications: Secondary | ICD-10-CM | POA: Diagnosis not present

## 2014-11-28 LAB — POCT GLYCOSYLATED HEMOGLOBIN (HGB A1C): HEMOGLOBIN A1C: 7.6

## 2014-11-28 MED ORDER — CANAGLIFLOZIN 300 MG PO TABS: 150.0000 mg | ORAL_TABLET | Freq: Every day | ORAL | Status: DC

## 2014-11-28 MED ORDER — METFORMIN HCL 500 MG PO TABS: 1000.0000 mg | ORAL_TABLET | Freq: Two times a day (BID) | ORAL | Status: DC

## 2014-11-28 NOTE — Patient Instructions (Addendum)
check your blood sugar once a day.  vary the time of day when you check, between before the 3 meals, and at bedtime.  also check if you have symptoms of your blood sugar being too high or too low.  please keep a record of the readings and bring it to your next appointment here.  You can write it on any piece of paper.  please call us sooner if your blood sugar goes below 70, or if you have a lot of readings over 200.  Please resume the metformin.  Please come back for a follow-up appointment in 3 months.

## 2014-11-28 NOTE — Progress Notes (Signed)
Subjective:    Patient ID: Colin Bennett, male    DOB: [birth year], 34 y.o.   MRN: [id number]  HPI  Pt returns for f/u of diabetes mellitus: DM type: 2 Dx'ed: 4132 Complications:  Therapy: 4 oral meds DKA: never Severe hypoglycemia: never Pancreatitis: never Other: he has never been on insulin; edema precludes rx with pioglitizone; he declines weight loss surgery.   Interval history:  pt states he feels well in general.  no cbg record, but states cbg's are well-controlled.  yeast infections has resolved with reduction of invokana.  He takes neither the metformin nor glimepiride.   Past Medical History  Diagnosis Date  . Kidney stones   . Diabetes mellitus   . Bipolar 1 disorder ([place])   . HTN (hypertension) 05/10/2012  . Preventative health care 05/14/2012  . Renal lithiasis 08/10/2012  . Bipolar disorder, unspecified ([facility]) 08/10/2012  . Obesity, unspecified 11/09/2012  . Pain, joint, shoulder, right 04/09/2013  . Diabetes mellitus type 2 in obese ([place]) 01/26/2014  . Diabetes mellitus type 2 in obese ([facility] [facility]) 06/10/2014  . Nephrolithiasis 06/10/2014    Past Surgical History  Procedure Laterality Date  . Coronary artery bypass graft      Social History   Social History  . Marital Status: Married    Spouse Name: N/A  . Number of Children: 0  . Years of Education: N/A   Occupational History  . Collateral recovery    Social History Main Topics  . Smoking status: Never Smoker   . Smokeless tobacco: Never Used  . Alcohol Use: No  . Drug Use: No  . Sexual Activity: Not on file   Other Topics Concern  . Not on file   Social History Narrative    Current Outpatient Prescriptions on File Prior to Visit  Medication Sig Dispense Refill  . JANUVIA 100 MG tablet TAKE ONE TABLET BY MOUTH ONCE DAILY 30 tablet 0  . lamoTRIgine (LAMICTAL) 100 MG tablet Take 0.5 tablets (50 mg total) by mouth daily. 45 tablet 1  . lisinopril (PRINIVIL,ZESTRIL) 5 MG tablet Take 1 tablet (5 mg total) by  mouth daily. 90 tablet 3  . metoprolol succinate (TOPROL-XL) 50 MG 24 hr tablet Take 1 tablet (50 mg total) by mouth daily. Take with or immediately following a meal. 30 tablet 3  . pravastatin (PRAVACHOL) 10 MG tablet Take 1 tablet (10 mg total) by mouth daily. 90 tablet 2  . venlafaxine XR (EFFEXOR XR) 75 MG 24 hr capsule Take 1 capsule (75 mg total) by mouth daily with breakfast. 30 capsule 3  . baclofen (LIORESAL) 10 MG tablet Take 1 tablet (10 mg total) by mouth 3 (three) times daily. (Patient not taking: Reported on 11/28/2014) 30 each 0  . clotrimazole (LOTRIMIN) 1 % cream Apply 1 application topically 2 (two) times daily. (Patient not taking: Reported on 11/28/2014) 30 g 0  . fluconazole (DIFLUCAN) 150 MG tablet Take 1 tablet (150 mg total) by mouth once. (Patient not taking: Reported on 11/28/2014) 1 tablet 0  . meloxicam (MOBIC) 15 MG tablet Take 1 tablet (15 mg total) by mouth daily. (Patient not taking: Reported on 11/28/2014) 30 tablet 0  . mupirocin ointment (BACTROBAN) 2 % Place 1 application into the nose daily as needed. Epistaxis/dry nose (Patient not taking: Reported on 11/28/2014) 22 g 1   No current facility-administered medications on file prior to visit.    No Known Allergies  Family History  Problem Relation Age of Onset  .  Hypertension Other   . Diabetes Other     BP 138/88 mmHg  Pulse 107  Temp(Src) 98 F (36.7 C) (Oral)  Ht 6\' 2"  (1.88 m)  Wt 363 lb (164.656 kg)  BMI 46.59 kg/m2  SpO2 96%  Review of Systems He denies hypoglycemia    Objective:   Physical Exam VITAL SIGNS:  See vs page GENERAL: no distress Pulses: dorsalis pedis intact bilat.   MSK: no deformity of the feet CV: trace bilat leg edema Skin:  no ulcer on the feet.  normal color and temp on the feet. Neuro: sensation is intact to touch on the feet.     A1c=7.6%    Assessment & Plan:  DM: Needs increased rx, if it can be done with a regimen that avoids or minimizes  hypoglycemia.  Patient is advised the following: Patient Instructions  check your blood sugar once a day.  vary the time of day when you check, between before the 3 meals, and at bedtime.  also check if you have symptoms of your blood sugar being too high or too low.  please keep a record of the readings and bring it to your next appointment here.  You can write it on any piece of paper.  please call us sooner if your blood sugar goes below 70, or if you have a lot of readings over 200.  Please resume the metformin.  Please come back for a follow-up appointment in 3 months.

## 2014-11-30 ENCOUNTER — Encounter: Payer: Self-pay | Admitting: Family Medicine

## 2014-11-30 ENCOUNTER — Other Ambulatory Visit (INDEPENDENT_AMBULATORY_CARE_PROVIDER_SITE_OTHER): Payer: Commercial Managed Care - PPO

## 2014-11-30 ENCOUNTER — Ambulatory Visit (INDEPENDENT_AMBULATORY_CARE_PROVIDER_SITE_OTHER): Payer: Commercial Managed Care - PPO | Admitting: Family Medicine

## 2014-11-30 VITALS — BP 134/86 | HR 75 | Temp 98.1°F | Resp 16 | Ht 74.0 in | Wt 360.5 lb

## 2014-11-30 DIAGNOSIS — E785 Hyperlipidemia, unspecified: Secondary | ICD-10-CM | POA: Diagnosis not present

## 2014-11-30 DIAGNOSIS — E1169 Type 2 diabetes mellitus with other specified complication: Secondary | ICD-10-CM

## 2014-11-30 DIAGNOSIS — J0100 Acute maxillary sinusitis, unspecified: Secondary | ICD-10-CM | POA: Diagnosis not present

## 2014-11-30 DIAGNOSIS — E119 Type 2 diabetes mellitus without complications: Secondary | ICD-10-CM

## 2014-11-30 DIAGNOSIS — E669 Obesity, unspecified: Secondary | ICD-10-CM | POA: Diagnosis not present

## 2014-11-30 DIAGNOSIS — Z87442 Personal history of urinary calculi: Secondary | ICD-10-CM

## 2014-11-30 DIAGNOSIS — I10 Essential (primary) hypertension: Secondary | ICD-10-CM

## 2014-11-30 LAB — CBC
HEMATOCRIT: 49.2 % (ref 39.0–52.0)
Hemoglobin: 16.3 g/dL (ref 13.0–17.0)
MCHC: 33 g/dL (ref 30.0–36.0)
MCV: 88.9 fl (ref 78.0–100.0)
Platelets: 243 10*3/uL (ref 150.0–400.0)
RBC: 5.53 Mil/uL (ref 4.22–5.81)
RDW: 14.1 % (ref 11.5–15.5)
WBC: 11.6 10*3/uL — ABNORMAL HIGH (ref 4.0–10.5)

## 2014-11-30 LAB — HEMOGLOBIN A1C: HEMOGLOBIN A1C: 7.6 % — AB (ref 4.6–6.5)

## 2014-11-30 LAB — COMPREHENSIVE METABOLIC PANEL
ALT: 24 U/L (ref 0–53)
AST: 16 U/L (ref 0–37)
Albumin: 3.9 g/dL (ref 3.5–5.2)
Alkaline Phosphatase: 75 U/L (ref 39–117)
BUN: 13 mg/dL (ref 6–23)
CHLORIDE: 102 meq/L (ref 96–112)
CO2: 27 mEq/L (ref 19–32)
Calcium: 9.8 mg/dL (ref 8.4–10.5)
Creatinine, Ser: 0.73 mg/dL (ref 0.40–1.50)
GFR: 129.58 mL/min (ref >60.00)
GLUCOSE: 150 mg/dL — AB (ref 70–99)
POTASSIUM: 4 meq/L (ref 3.5–5.1)
SODIUM: 137 meq/L (ref 135–145)
Total Bilirubin: 0.4 mg/dL (ref 0.2–1.2)
Total Protein: 7.5 g/dL (ref 6.0–8.3)

## 2014-11-30 LAB — LIPID PANEL
CHOL/HDL RATIO: 4
Cholesterol: 161 mg/dL (ref 0–200)
HDL: 36 mg/dL — AB (ref >39.00)
LDL CALC: 93 mg/dL (ref 0–99)
NonHDL: 125.38
TRIGLYCERIDES: 164 mg/dL — AB (ref 0.0–149.0)
VLDL: 32.8 mg/dL (ref 0.0–40.0)

## 2014-11-30 LAB — TSH: TSH: 1.11 u[IU]/mL (ref 0.35–4.50)

## 2014-11-30 MED ORDER — AMOXICILLIN 875 MG PO TABS: 875.0000 mg | ORAL_TABLET | Freq: Two times a day (BID) | ORAL | Status: DC

## 2014-11-30 NOTE — Progress Notes (Signed)
Pre visit review using our clinic review tool, if applicable. No additional management support is needed unless otherwise documented below in the visit note. 

## 2014-11-30 NOTE — Patient Instructions (Signed)
Follow up as needed Start the Amoxicillin twice daily- take w/ food Drink plenty of fluids Continue Mucinex DM for cough/congestion REST! Call with any questions or concerns Hang in there!!!

## 2014-11-30 NOTE — Progress Notes (Signed)
   Subjective:    Patient ID: Colin Bennett, male    DOB: [birth year], 34 y.o.   MRN: [id number]  HPI URI- sxs started 'a couple of days ago'.  + nasal congestion, sore throat, PND.  Taking Mucinex w/ minimal and temporary relief.  + frontal and maxillary sinus pressure.  No tooth pain.  No fevers.  Mild cough- rarely productive.  No ear pain.  No known sick contacts.  No N/V/D.   Review of Systems For ROS see HPI     Objective:   Physical Exam  Constitutional: He appears well-developed and well-nourished. No distress.  HENT:  Head: Normocephalic and atraumatic.  Right Ear: Tympanic membrane normal.  Left Ear: Tympanic membrane normal.  Nose: Mucosal edema and rhinorrhea present. Right sinus exhibits maxillary sinus tenderness and frontal sinus tenderness. Left sinus exhibits maxillary sinus tenderness and frontal sinus tenderness.  Mouth/Throat: Mucous membranes are normal. Oropharyngeal exudate and posterior oropharyngeal erythema present. No posterior oropharyngeal edema.  + PND  Eyes: Conjunctivae and EOM are normal. Pupils are equal, round, and reactive to light.  Neck: Normal range of motion. Neck supple.  Cardiovascular: Normal rate, regular rhythm and normal heart sounds.   Pulmonary/Chest: Effort normal and breath sounds normal. No respiratory distress. He has no wheezes.  Lymphadenopathy:    He has no cervical adenopathy.  Skin: Skin is warm and dry.  Vitals reviewed.         Assessment & Plan:

## 2014-11-30 NOTE — Addendum Note (Signed)
Addended by: Peggyann Shoals on: 11/30/2014 08:20 AM   Modules accepted: Orders

## 2014-11-30 NOTE — Assessment & Plan Note (Signed)
Pt's sxs and PE consistent w/ infxn.  Start abx.  Reviewed supportive care and red flags that should prompt return.  Pt expressed understanding and is in agreement w/ plan.  

## 2014-12-06 ENCOUNTER — Ambulatory Visit: Payer: Commercial Managed Care - PPO | Admitting: Family Medicine

## 2014-12-07 ENCOUNTER — Ambulatory Visit (INDEPENDENT_AMBULATORY_CARE_PROVIDER_SITE_OTHER): Payer: Commercial Managed Care - PPO | Admitting: Family Medicine

## 2014-12-07 ENCOUNTER — Encounter: Payer: Self-pay | Admitting: Family Medicine

## 2014-12-07 VITALS — BP 108/76 | HR 78 | Temp 97.5°F | Ht 74.0 in | Wt 364.0 lb

## 2014-12-07 DIAGNOSIS — I10 Essential (primary) hypertension: Secondary | ICD-10-CM | POA: Diagnosis not present

## 2014-12-07 DIAGNOSIS — E785 Hyperlipidemia, unspecified: Secondary | ICD-10-CM | POA: Diagnosis not present

## 2014-12-07 DIAGNOSIS — E1169 Type 2 diabetes mellitus with other specified complication: Secondary | ICD-10-CM

## 2014-12-07 DIAGNOSIS — E119 Type 2 diabetes mellitus without complications: Secondary | ICD-10-CM

## 2014-12-07 DIAGNOSIS — R000 Tachycardia, unspecified: Secondary | ICD-10-CM | POA: Diagnosis not present

## 2014-12-07 DIAGNOSIS — B3742 Candidal balanitis: Secondary | ICD-10-CM

## 2014-12-07 DIAGNOSIS — E669 Obesity, unspecified: Secondary | ICD-10-CM | POA: Diagnosis not present

## 2014-12-07 MED ORDER — METOPROLOL SUCCINATE ER 50 MG PO TB24: 50.0000 mg | ORAL_TABLET | Freq: Every day | ORAL | Status: DC

## 2014-12-07 MED ORDER — CANAGLIFLOZIN 300 MG PO TABS: 150.0000 mg | ORAL_TABLET | Freq: Every day | ORAL | Status: DC

## 2014-12-07 MED ORDER — VENLAFAXINE HCL ER 150 MG PO CP24: 150.0000 mg | ORAL_CAPSULE | Freq: Every day | ORAL | Status: DC

## 2014-12-07 MED ORDER — METFORMIN HCL 500 MG PO TABS: 500.0000 mg | ORAL_TABLET | Freq: Two times a day (BID) | ORAL | Status: DC

## 2014-12-07 MED ORDER — LAMOTRIGINE 100 MG PO TABS: 100.0000 mg | ORAL_TABLET | Freq: Every day | ORAL | Status: DC

## 2014-12-07 MED ORDER — SITAGLIPTIN PHOSPHATE 100 MG PO TABS
100.0000 mg | ORAL_TABLET | Freq: Every day | ORAL | Status: DC
Start: 2014-12-07 — End: 2015-04-02

## 2014-12-07 NOTE — Progress Notes (Signed)
Subjective:    Patient ID: Colin Bennett, male    DOB: [birth year], 34 y.o.   MRN: [id number]  Chief Complaint  Patient presents with  . Follow-up    HPI Patient is in today for follow up. Has recently had a candidal skin infection and a sinusitis treated but is feeling better in both regards at this time. No fevers or acute concerns today. Continues to struggle with fatigue and anhedonia but no suicidal ideation. Sugars still elevated but improving. No sugars below 100 and a max of 224 noted. Is trying to minimize carbs in diet. Denies CP/palp/SOB/HA/congestion/fevers/GI or GU c/o. Taking meds as prescribed  Past Medical History  Diagnosis Date  . Kidney stones   . Diabetes mellitus   . Bipolar 1 disorder ([place])   . HTN (hypertension) 05/10/2012  . Preventative health care 05/14/2012  . Renal lithiasis 08/10/2012  . Bipolar disorder, unspecified ([place]) 08/10/2012  . Obesity, unspecified 11/09/2012  . Pain, joint, shoulder, right 04/09/2013  . Diabetes mellitus type 2 in obese ([place]) 01/26/2014  . Diabetes mellitus type 2 in obese ([place]) 06/10/2014  . Nephrolithiasis 06/10/2014    Past Surgical History  Procedure Laterality Date  . Coronary artery bypass graft      Family History  Problem Relation Age of Onset  . Hypertension Other   . Diabetes Other     Social History   Social History  . Marital Status: Married    Spouse Name: N/A  . Number of Children: 0  . Years of Education: N/A   Occupational History  . Collateral recovery    Social History Main Topics  . Smoking status: Never Smoker   . Smokeless tobacco: Never Used  . Alcohol Use: No  . Drug Use: No  . Sexual Activity: Not on file   Other Topics Concern  . Not on file   Social History Narrative    Outpatient Prescriptions Prior to Visit  Medication Sig Dispense Refill  . amoxicillin (AMOXIL) 875 MG tablet Take 1 tablet (875 mg total) by mouth 2 (two) times daily. 20 tablet 0  . lisinopril  (PRINIVIL,ZESTRIL) 5 MG tablet Take 1 tablet (5 mg total) by mouth daily. 90 tablet 3  . pravastatin (PRAVACHOL) 10 MG tablet Take 1 tablet (10 mg total) by mouth daily. 90 tablet 2  . venlafaxine XR (EFFEXOR XR) 75 MG 24 hr capsule Take 1 capsule (75 mg total) by mouth daily with breakfast. 30 capsule 3  . canagliflozin (INVOKANA) 300 MG TABS tablet Take 150 mg by mouth daily before breakfast. 15 tablet 11  . JANUVIA 100 MG tablet TAKE ONE TABLET BY MOUTH ONCE DAILY 30 tablet 0  . lamoTRIgine (LAMICTAL) 100 MG tablet Take 0.5 tablets (50 mg total) by mouth daily. 45 tablet 1  . metFORMIN (GLUCOPHAGE) 500 MG tablet Take 2 tablets (1,000 mg total) by mouth 2 (two) times daily with a meal. 180 tablet 2  . metoprolol succinate (TOPROL-XL) 50 MG 24 hr tablet Take 1 tablet (50 mg total) by mouth daily. Take with or immediately following a meal. 30 tablet 3   No facility-administered medications prior to visit.    No Known Allergies  Review of Systems  Constitutional: Positive for malaise/fatigue. Negative for fever.  HENT: Negative for congestion.   Eyes: Negative for discharge.  Respiratory: Negative for shortness of breath.   Cardiovascular: Negative for chest pain, palpitations and leg swelling.  Gastrointestinal: Negative for nausea and abdominal pain.  Genitourinary: Negative for  dysuria.  Musculoskeletal: Negative for falls.  Skin: Positive for rash.  Neurological: Negative for loss of consciousness and headaches.  Endo/Heme/Allergies: Negative for environmental allergies.  Psychiatric/Behavioral: Negative for depression. The patient is not nervous/anxious.        Objective:    Physical Exam  Constitutional: He is oriented to person, place, and time. He appears well-developed and well-nourished. No distress.  HENT:  Head: Normocephalic and atraumatic.  Nose: Nose normal.  Eyes: Right eye exhibits no discharge. Left eye exhibits no discharge.  Neck: Normal range of motion. Neck  supple.  Cardiovascular: Normal rate and regular rhythm.   No murmur heard. Pulmonary/Chest: Effort normal and breath sounds normal.  Abdominal: Soft. Bowel sounds are normal. There is no tenderness.  Musculoskeletal: He exhibits no edema.  Neurological: He is alert and oriented to person, place, and time.  Skin: Skin is warm and dry.  Psychiatric: He has a normal mood and affect.  Nursing note and vitals reviewed.   BP 108/76 mmHg  Pulse 78  Temp(Src) 97.5 F (36.4 C) (Oral)  Ht 6\' 2"  (1.88 m)  Wt 364 lb (165.109 kg)  BMI 46.71 kg/m2  SpO2 95% Wt Readings from Last 3 Encounters:  12/07/14 364 lb (165.109 kg)  11/30/14 360 lb 8 oz (163.522 kg)  11/28/14 363 lb (164.656 kg)     Lab Results  Component Value Date   WBC 11.6* 11/30/2014   HGB 16.3 11/30/2014   HCT 49.2 11/30/2014   PLT 243.0 11/30/2014   GLUCOSE 150* 11/30/2014   CHOL 161 11/30/2014   TRIG 164.0* 11/30/2014   HDL 36.00* 11/30/2014   LDLCALC 93 11/30/2014   ALT 24 11/30/2014   AST 16 11/30/2014   NA 137 11/30/2014   K 4.0 11/30/2014   CL 102 11/30/2014   CREATININE 0.73 11/30/2014   BUN 13 11/30/2014   CO2 27 11/30/2014   TSH 1.11 11/30/2014   HGBA1C 7.6* 11/30/2014   MICROALBUR 1.47 10/30/2011    Lab Results  Component Value Date   TSH 1.11 11/30/2014   Lab Results  Component Value Date   WBC 11.6* 11/30/2014   HGB 16.3 11/30/2014   HCT 49.2 11/30/2014   MCV 88.9 11/30/2014   PLT 243.0 11/30/2014   Lab Results  Component Value Date   NA 137 11/30/2014   K 4.0 11/30/2014   CO2 27 11/30/2014   GLUCOSE 150* 11/30/2014   BUN 13 11/30/2014   CREATININE 0.73 11/30/2014   BILITOT 0.4 11/30/2014   ALKPHOS 75 11/30/2014   AST 16 11/30/2014   ALT 24 11/30/2014   PROT 7.5 11/30/2014   ALBUMIN 3.9 11/30/2014   CALCIUM 9.8 11/30/2014   GFR 129.58 11/30/2014   Lab Results  Component Value Date   CHOL 161 11/30/2014   Lab Results  Component Value Date   HDL 36.00* 11/30/2014   Lab  Results  Component Value Date   LDLCALC 93 11/30/2014   Lab Results  Component Value Date   TRIG 164.0* 11/30/2014   Lab Results  Component Value Date   CHOLHDL 4 11/30/2014   Lab Results  Component Value Date   HGBA1C 7.6* 11/30/2014       Assessment & Plan:   Problem List Items Addressed This Visit    Candidal balanitis    Improving, encouraged mild soap and water daily then blow dry. Add probiotic and topical treatments as needed, report if worsening      Relevant Orders   TSH   CBC  Comprehensive metabolic panel   Lipid panel   Hemoglobin A1c   Microalbumin / creatinine urine ratio   Diabetes mellitus type 2 in obese ([facility])    hgba1c elevated but improving, minimize simple carbs. Increase exercise as tolerated. Continue current meds but restart Januvia, which he stopped for financial concerns.       Relevant Medications   sitaGLIPtin (JANUVIA) 100 MG tablet   canagliflozin (INVOKANA) 300 MG TABS tablet   metFORMIN (GLUCOPHAGE) 500 MG tablet   Other Relevant Orders   TSH   CBC   Comprehensive metabolic panel   Lipid panel   Hemoglobin A1c   Microalbumin / creatinine urine ratio   HTN (hypertension) - Primary    Well controlled, no changes to meds. Encouraged heart healthy diet such as the DASH diet and exercise as tolerated.       Relevant Medications   metoprolol succinate (TOPROL-XL) 50 MG 24 hr tablet   Other Relevant Orders   TSH   CBC   Comprehensive metabolic panel   Lipid panel   Hemoglobin A1c   Microalbumin / creatinine urine ratio   Hyperlipidemia   Relevant Medications   metoprolol succinate (TOPROL-XL) 50 MG 24 hr tablet   Other Relevant Orders   TSH   CBC   Comprehensive metabolic panel   Lipid panel   Hemoglobin A1c   Microalbumin / creatinine urine ratio   Obesity    Encouraged DASH diet, decrease po intake and increase exercise as tolerated. Needs 7-8 hours of sleep nightly. Avoid trans fats, eat small, frequent meals every 4-5  hours with lean proteins, complex carbs and healthy fats. Minimize simple carbs, GMO foods.      Relevant Medications   sitaGLIPtin (JANUVIA) 100 MG tablet   canagliflozin (INVOKANA) 300 MG TABS tablet   metFORMIN (GLUCOPHAGE) 500 MG tablet   Other Relevant Orders   TSH   CBC   Comprehensive metabolic panel   Lipid panel   Hemoglobin A1c   Microalbumin / creatinine urine ratio   Tachycardia    Improved on recheck      Relevant Orders   TSH   CBC   Comprehensive metabolic panel   Lipid panel   Hemoglobin A1c   Microalbumin / creatinine urine ratio      I have changed Mr. Hewins's JANUVIA to sitaGLIPtin. I have also changed his metFORMIN and lamoTRIgine. I am also having him start on venlafaxine XR. Additionally, I am having him maintain his pravastatin, lisinopril, venlafaxine XR, amoxicillin, metoprolol succinate, and canagliflozin.  Meds ordered this encounter  Medications  . metoprolol succinate (TOPROL-XL) 50 MG 24 hr tablet    Sig: Take 1 tablet (50 mg total) by mouth daily. Take with or immediately following a meal.    Dispense:  30 tablet    Refill:  5  . sitaGLIPtin (JANUVIA) 100 MG tablet    Sig: Take 1 tablet (100 mg total) by mouth daily.    Dispense:  30 tablet    Refill:  5  . canagliflozin (INVOKANA) 300 MG TABS tablet    Sig: Take 150 mg by mouth daily before breakfast.    Dispense:  15 tablet    Refill:  11  . metFORMIN (GLUCOPHAGE) 500 MG tablet    Sig: Take 1 tablet (500 mg total) by mouth 2 (two) times daily with a meal.    Dispense:  180 tablet    Refill:  2  . lamoTRIgine (LAMICTAL) 100 MG tablet    Sig:  Take 1 tablet (100 mg total) by mouth daily.    Dispense:  90 tablet    Refill:  1  . venlafaxine XR (EFFEXOR XR) 150 MG 24 hr capsule    Sig: Take 1 capsule (150 mg total) by mouth daily with breakfast.    Dispense:  30 capsule    Refill:  5     BLYTH, STACEY, MD

## 2014-12-07 NOTE — Patient Instructions (Signed)
Probiotics daily such as [employer] or [employer] or online [place] has a 10 strain version 1 cap daily. [email or web address].com Cleanse with witch hazel astringent then blow dry  Cutaneous Candidiasis Cutaneous candidiasis is a condition in which there is an overgrowth of yeast (candida) on the skin. Yeast normally live on the skin, but in small enough numbers not to cause any symptoms. In certain cases, increased growth of the yeast may cause an actual yeast infection. This kind of infection usually occurs in areas of the skin that are constantly warm and moist, such as the armpits or the groin. Yeast is the most common cause of diaper rash in babies and in people who cannot control their bowel movements (incontinence). CAUSES  The fungus that most often causes cutaneous candidiasis is Candida albicans. Conditions that can increase the risk of getting a yeast infection of the skin include:  Obesity.  Pregnancy.  Diabetes.  Taking antibiotic medicine.  Taking birth control pills.  Taking steroid medicines.  Thyroid disease.  An iron or zinc deficiency.  Problems with the immune system. SYMPTOMS   Red, swollen area of the skin.  Bumps on the skin.  Itchiness. DIAGNOSIS  The diagnosis of cutaneous candidiasis is usually based on its appearance. Light scrapings of the skin may also be taken and viewed under a microscope to identify the presence of yeast. TREATMENT  Antifungal creams may be applied to the infected skin. In severe cases, oral medicines may be needed.  HOME CARE INSTRUCTIONS   Keep your skin clean and dry.  Maintain a healthy weight.  If you have diabetes, keep your blood sugar under control. SEEK IMMEDIATE MEDICAL CARE IF:  Your rash continues to spread despite treatment.  You have a fever, chills, or abdominal pain.   This information is not intended to replace advice given to you by your health care provider. Make sure you discuss any  questions you have with your health care provider.   Document Released: 09/23/2010 Document Revised: 03/30/2011 Document Reviewed: 07/09/2014 Elsevier Interactive Patient Education [employer].

## 2014-12-07 NOTE — Progress Notes (Signed)
Pre visit review using our clinic review tool, if applicable. No additional management support is needed unless otherwise documented below in the visit note. 

## 2014-12-10 ENCOUNTER — Ambulatory Visit: Payer: Commercial Managed Care - PPO | Admitting: Family Medicine

## 2014-12-10 DIAGNOSIS — Z0289 Encounter for other administrative examinations: Secondary | ICD-10-CM

## 2014-12-16 NOTE — Assessment & Plan Note (Signed)
Improved on recheck.  

## 2014-12-16 NOTE — Assessment & Plan Note (Signed)
Improving, encouraged mild soap and water daily then blow dry. Add probiotic and topical treatments as needed, report if worsening

## 2014-12-16 NOTE — Assessment & Plan Note (Signed)
Well controlled, no changes to meds. Encouraged heart healthy diet such as the DASH diet and exercise as tolerated.  °

## 2014-12-16 NOTE — Assessment & Plan Note (Signed)
Encouraged DASH diet, decrease po intake and increase exercise as tolerated. Needs 7-8 hours of sleep nightly. Avoid trans fats, eat small, frequent meals every 4-5 hours with lean proteins, complex carbs and healthy fats. Minimize simple carbs, GMO foods. 

## 2014-12-16 NOTE — Assessment & Plan Note (Addendum)
hgba1c elevated but improving, minimize simple carbs. Increase exercise as tolerated. Continue current meds but restart Januvia, which he stopped for financial concerns.

## 2015-01-16 ENCOUNTER — Ambulatory Visit (INDEPENDENT_AMBULATORY_CARE_PROVIDER_SITE_OTHER): Payer: Commercial Managed Care - PPO | Admitting: Family Medicine

## 2015-01-16 ENCOUNTER — Encounter: Payer: Self-pay | Admitting: Family Medicine

## 2015-01-16 VITALS — BP 134/82 | HR 91 | Ht 74.0 in | Wt 360.0 lb

## 2015-01-16 DIAGNOSIS — M999 Biomechanical lesion, unspecified: Secondary | ICD-10-CM

## 2015-01-16 DIAGNOSIS — M9903 Segmental and somatic dysfunction of lumbar region: Secondary | ICD-10-CM

## 2015-01-16 DIAGNOSIS — M9902 Segmental and somatic dysfunction of thoracic region: Secondary | ICD-10-CM

## 2015-01-16 DIAGNOSIS — M9908 Segmental and somatic dysfunction of rib cage: Secondary | ICD-10-CM

## 2015-01-16 DIAGNOSIS — M546 Pain in thoracic spine: Secondary | ICD-10-CM

## 2015-01-16 MED ORDER — TIZANIDINE HCL 4 MG PO CAPS
4.0000 mg | ORAL_CAPSULE | Freq: Three times a day (TID) | ORAL | Status: DC | PRN
Start: 2015-01-16 — End: 2015-02-15

## 2015-01-16 NOTE — Assessment & Plan Note (Signed)
More spasms in previously.worsening problem. Given a muscle relaxer and anti-inflammatories. We discussed icing regimen. Discussed range of motion exercises. If worsening symptoms we may need to consider prednisone. Encourage weight loss in the long run. Return in 2-3 weeks for further evaluation.

## 2015-01-16 NOTE — Assessment & Plan Note (Signed)
Decision today to treat with OMT was based on Physical Exam  After verbal consent patient was treated with HVLA, ME, FPR techniques in thoracic, rib,, lumbar areas  Patient tolerated the procedure well with improvement in symptoms  Patient given exercises, stretches and lifestyle modifications  Patient is having significant more difficult to know with the tightness. See if any further imaging will be needed in the future.  Patient will follow up in 3 week

## 2015-01-16 NOTE — Progress Notes (Signed)
  [name] [address], [place] [address] Phone: [phone] Subjective:    CC: Back painfollow-up, right shoulder pain follow up  [id number] Colin Bennett is a 34 y.o. male coming in with complaint of back pain. Having more exacerbation of the low back pain. Seems to be more on the left side. States that it keeps catching him. Gives him severe pain that causes him to lose his breath. Has to stay still for multiple seconds and then is able to move again. Patient will do more of an icing regimen but has not been doing regularly. Also not doing the home exercises regularly. Does not remember any other true injury at this time.       Past medical history, social, surgical and family history all reviewed in electronic medical record.   Review of Systems: No headache, visual changes, nausea, vomiting, diarrhea, constipation, dizziness, abdominal pain, skin rash, fevers, chills, night sweats, weight loss, swollen lymph nodes, body aches, joint swelling, muscle aches, chest pain, shortness of breath, mood changes.   Objective Blood pressure 134/82, pulse 91, height 6\' 2"  (1.88 m), weight 360 lb (163.295 kg), SpO2 96 %.  General: No apparent distress alert and oriented x3 mood and affect normal, dressed appropriately. Morbidly obese HEENT: Pupils equal, extraocular movements intact  Respiratory: Patient's speak in full sentences and does not appear short of breath  Cardiovascular: No lower extremity edema, non tender, no erythema  Skin: Warm dry intact with no signs of infection or rash on extremities or on axial skeleton.  Abdomen: Soft nontender  Neuro: Cranial nerves II through XII are intact, neurovascularly intact in all extremities with 2+ DTRs and 2+ pulses.  Lymph: No lymphadenopathy of posterior or anterior cervical chain or axillae bilaterally.  Gait normal with good balance and coordination.  MSK:  Non tender with full range of motion and  good stability and symmetric strength and tone of , elbows, wrist, hip, knee and ankles bilaterally.    Back Exam:  Inspection: . moderately stiff with increasing kyphosis.possibly more stiff than previous exam. Motion: Flexion 30 deg, Extension 25 deg, Side Bending to 25 deg bilaterally,  Rotation to 30 deg bilaterally . Continues to have stiffnessan worse than previous exam SLR laying: Negative  XSLR laying: Negative  Palpable tenderness: severe back spasms of the thoracolumbar junction mostly on the left side FABER: negative. Sensory change: Gross sensation intact to all lumbar and sacral dermatomes.  Reflexes: 2+ at both patellar tendons, 2+ at achilles tendons, Babinski's downgoing.  Strength at foot  Plantar-flexion: 5/5 Dorsi-flexion: 5/5 Eversion: 5/5 Inversion: 5/5  Leg strength  Quad: 5/5 Hamstring: 5/5 Hip flexor: 5/5 Hip abductors: 4/5  Gait unremarkable.  Osteopathic findings Cervical C4 flexed rotated and side bent left Thoracic T3 extended rotated and side bent left with inhaled third rib T5 extended rotated and side bent right T7 extended rotated and side bent left Lumbar L2 flexed rotated and side bent right Sacrum left on left    .     Impression and Recommendations:     This case required medical decision making of moderate complexity.

## 2015-01-16 NOTE — Progress Notes (Signed)
Pre visit review using our clinic review tool, if applicable. No additional management support is needed unless otherwise documented below in the visit note. 

## 2015-01-16 NOTE — Patient Instructions (Signed)
Good to see you Ice is your friend Zanaflex 3 times a day as needed Duexis 3 times a day for 6 days Try to stay active See me again in 2-3 weeks so we do not get quite so tight Happy New Year!

## 2015-02-06 ENCOUNTER — Ambulatory Visit (INDEPENDENT_AMBULATORY_CARE_PROVIDER_SITE_OTHER): Payer: Commercial Managed Care - PPO | Admitting: Family Medicine

## 2015-02-06 ENCOUNTER — Encounter: Payer: Self-pay | Admitting: Family Medicine

## 2015-02-06 VITALS — BP 112/84 | HR 96 | Ht 74.0 in | Wt 358.0 lb

## 2015-02-06 DIAGNOSIS — M6248 Contracture of muscle, other site: Secondary | ICD-10-CM

## 2015-02-06 DIAGNOSIS — M999 Biomechanical lesion, unspecified: Secondary | ICD-10-CM

## 2015-02-06 DIAGNOSIS — M9902 Segmental and somatic dysfunction of thoracic region: Secondary | ICD-10-CM

## 2015-02-06 DIAGNOSIS — M9903 Segmental and somatic dysfunction of lumbar region: Secondary | ICD-10-CM | POA: Diagnosis not present

## 2015-02-06 DIAGNOSIS — M9908 Segmental and somatic dysfunction of rib cage: Secondary | ICD-10-CM

## 2015-02-06 DIAGNOSIS — G2589 Other specified extrapyramidal and movement disorders: Secondary | ICD-10-CM | POA: Diagnosis not present

## 2015-02-06 DIAGNOSIS — M62838 Other muscle spasm: Secondary | ICD-10-CM | POA: Insufficient documentation

## 2015-02-06 DIAGNOSIS — E669 Obesity, unspecified: Secondary | ICD-10-CM | POA: Diagnosis not present

## 2015-02-06 NOTE — Progress Notes (Signed)
Pre visit review using our clinic review tool, if applicable. No additional management support is needed unless otherwise documented below in the visit note. 

## 2015-02-06 NOTE — Progress Notes (Signed)
  [name] [address] [place], [place] [address] Phone: [phone] Subjective:    CC: Back painfollow-up, right shoulder pain follow up  [id number] Colin Bennett is a 35 y.o. male coming in with complaint of back pain. Patient was seen 3 weeks ago and was have an exacerbation of low back pain. Patient was motivated to try to start losing weight potentially. Patient was tryingto workout may have had an exacerbation.patient was given anti-inflammatories to take scheduled for 3-4 days as well as muscle relaxer to help with any breakthrough pain. Patient states overall he has made some improvement. Approximately 10-20% better. States that the muscle relaxer makes him sleepy which is good and allows him to sleep longer throughout the night. No significant hangover in the morning. 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 We'll be doing a lot more repetitive motion.       Past medical history, social, surgical and family history all reviewed in electronic medical record.   Review of Systems: No headache, visual changes, nausea, vomiting, diarrhea, constipation, dizziness, abdominal pain, skin rash, fevers, chills, night sweats, weight loss, swollen lymph nodes, body aches, joint swelling, muscle aches, chest pain, shortness of breath, mood changes.   Objective Blood pressure 112/84, pulse 96, height 6\' 2"  (1.88 m), weight 358 lb (162.388 kg), SpO2 95 %.  General: No apparent distress alert and oriented x3 mood and affect normal, dressed appropriately. Morbidly obese HEENT: Pupils equal, extraocular movements intact  Respiratory: Patient's speak in full sentences and does not appear short of breath  Cardiovascular: No lower extremity edema, non tender, no erythema  Skin: Warm dry intact with no signs of infection or rash on extremities or on axial skeleton.  Abdomen: Soft nontender  Neuro: Cranial nerves II through XII are  intact, neurovascularly intact in all extremities with 2+ DTRs and 2+ pulses.  Lymph: No lymphadenopathy of posterior or anterior cervical chain or axillae bilaterally.  Gait normal with good balance and coordination.  MSK:  Non tender with full range of motion and good stability and symmetric strength and tone of , elbows, wrist, hip, knee and ankles bilaterally.    Back Exam:  Inspection: . moderately stiff with increasing kyphosis.patient with range of motion testing was having more spasming Motion: Flexion 30 deg, Extension 25 deg, Side Bending to 25 deg bilaterally,  Rotation to 30 deg bilaterally . Improvement stiffness SLR laying: Negative  XSLR laying: Negative  Palpable tenderness: severe back spasms of the thoracolumbar junction mostly on the left side FABER: negative. Sensory change: Gross sensation intact to all lumbar and sacral dermatomes.  Reflexes: 2+ at both patellar tendons, 2+ at achilles tendons, Babinski's downgoing.  Strength at foot  Plantar-flexion: 5/5 Dorsi-flexion: 5/5 Eversion: 5/5 Inversion: 5/5  Leg strength  Quad: 5/5 Hamstring: 5/5 Hip flexor: 5/5 Hip abductors: 4/5  Gait unremarkable.  Osteopathic findings Cervical C4 flexed rotated and side bent left Thoracic T3 extended rotated and side bent left with inhaled third rib T5 extended rotated and side bent rightpatient with manipulation did have more spasming. T7 extended rotated and side bent left Lumbar L2 flexed rotated and side bent right Sacrum left on left    .     Impression and Recommendations:     This case required medical decision making of moderate complexity.

## 2015-02-06 NOTE — Patient Instructions (Addendum)
Good to see you as always.  Keep it up on the weight loss.  I hope the job is going to be great Bring knee brace with you  We will consider labs at follow up for the cramping and will keep Charlett Blake in the loop  See me again in 3 weeks to make sure you are in place with the new job

## 2015-02-06 NOTE — Assessment & Plan Note (Signed)
Decision today to treat with OMT was based on Physical Exam  After verbal consent patient was treated with HVLA, ME, FPR techniques in thoracic, rib,, lumbar areas  Patient tolerated the procedure well with improvement in symptoms  Patient given exercises, stretches and lifestyle modifications  Patient is having significant more difficult to know with the tightness. See if any further imaging will be needed in the future.  Patient will follow up in 3 week

## 2015-02-06 NOTE — Assessment & Plan Note (Signed)
Patient is doing somewhat better. Patient continues to have the muscle spasms all. Seems to be out of proportion. I do think that this can be more secondary to patient's muscle fatigue and obesity. Patient is making progress though. Encourage him to do the exercises more regularly. Patient has been losing weight and we encouraged him to continue to do so. We discussed which activities potentially avoid. We discussed ergonomics with his new job. Patient will come back and see me again in 3 weeks just in case any exacerbation occurs with the new

## 2015-02-06 NOTE — Assessment & Plan Note (Signed)
Patient continues to have muscle spasms that seemed to be out of proportion. I am concerned the patient likely has some metabolic abnormality that can be contribute in. We discussed the possibility of getting labs to check potassium level, calcium, thyroid, iron and vitamin D. Patient declined at this time. Would like to wait until he has labs with his primary care provider.

## 2015-02-06 NOTE — Assessment & Plan Note (Signed)
Encourage weight loss. 

## 2015-02-14 ENCOUNTER — Other Ambulatory Visit: Payer: Self-pay | Admitting: Family Medicine

## 2015-02-15 MED ORDER — TIZANIDINE HCL 4 MG PO CAPS: 4.0000 mg | ORAL_CAPSULE | Freq: Three times a day (TID) | ORAL | Status: DC | PRN

## 2015-02-15 NOTE — Addendum Note (Signed)
Addended by: Earnstine Regal on: 02/15/2015 03:34 PM   Modules accepted: Orders

## 2015-02-27 ENCOUNTER — Encounter: Payer: Self-pay | Admitting: Family Medicine

## 2015-02-27 ENCOUNTER — Ambulatory Visit (INDEPENDENT_AMBULATORY_CARE_PROVIDER_SITE_OTHER): Payer: Commercial Managed Care - PPO | Admitting: Family Medicine

## 2015-02-27 VITALS — BP 112/82 | HR 83 | Ht 74.0 in | Wt 357.0 lb

## 2015-02-27 DIAGNOSIS — M9902 Segmental and somatic dysfunction of thoracic region: Secondary | ICD-10-CM | POA: Diagnosis not present

## 2015-02-27 DIAGNOSIS — M9908 Segmental and somatic dysfunction of rib cage: Secondary | ICD-10-CM

## 2015-02-27 DIAGNOSIS — M9903 Segmental and somatic dysfunction of lumbar region: Secondary | ICD-10-CM

## 2015-02-27 DIAGNOSIS — M546 Pain in thoracic spine: Secondary | ICD-10-CM

## 2015-02-27 DIAGNOSIS — M999 Biomechanical lesion, unspecified: Secondary | ICD-10-CM

## 2015-02-27 MED ORDER — TIZANIDINE HCL 4 MG PO CAPS: 4.0000 mg | ORAL_CAPSULE | Freq: Three times a day (TID) | ORAL | Status: DC | PRN

## 2015-02-27 MED ORDER — IBUPROFEN-FAMOTIDINE 800-26.6 MG PO TABS: ORAL_TABLET | ORAL | Status: DC

## 2015-02-27 NOTE — Progress Notes (Signed)
Pre visit review using our clinic review tool, if applicable. No additional management support is needed unless otherwise documented below in the visit note. 

## 2015-02-27 NOTE — Assessment & Plan Note (Signed)
Severe back pain overall but musculoskeletal and nature. Likely secondary to tight hip flexors. Given exercises again today. Prescription for oral anti-inflammatories. Refilled patient's muscle relaxer to take nightly. Patient will continue his other medications. We discussed the exercises given handout for the hip flexors. Patient and will see me back again in 3 weeks for further evaluation and treatment.

## 2015-02-27 NOTE — Assessment & Plan Note (Signed)
Decision today to treat with OMT was based on Physical Exam  After verbal consent patient was treated with HVLA, ME, FPR techniques in thoracic, rib,, lumbar areas  Patient tolerated the procedure well with improvement in symptoms  Patient given exercises, stretches and lifestyle modifications  Patient is having significant more difficult to know with the tightness. See if any further imaging will be needed in the future.  Patient will follow up in 3 week

## 2015-02-27 NOTE — Patient Instructions (Addendum)
Good to see you  Duexis up to 3 times daily especially on you hard days Muscle relaxer at night New stretches to try to do even before you get in your car to drive home Try to sit with a coat beneath legs with driving as well to change the angle and consider bringing  The peddles up See me again in 3 weeks.

## 2015-02-27 NOTE — Progress Notes (Signed)
[name] [place], [place] [address] Phone: [phone] Subjective:    CC: Back painfollow-up, right shoulder pain follow up  [id number] Colin Bennett is a 34 y.o. male coming in with complaint of back pain. Has had some exacerbations in the past more frequently. Patient was doing much better though at last follow-up. Patient is attempting to  try to lose weight and work out on a more regular basis. Patient states he has started new job over the course last 3 weeks. Having increasing muscle spasms more of the lower back. Seems to be when he is doing a lot of lifting and repetitive motion. States that he does fine with the activity but then after sitting for long amount of time he has difficulty straightening up. States that it can take multiple minutes to get out of the bed now and the mornings. Denies any radiation down the legs any numbness or tingling. Rates the severity of pain is 6 out of 10.   Does have muscle relaxers for breakthrough pain. He is using them most days of the week she states.   Last imaging and patient's lumbar spine were individually visualized by me. 05/31/2014 lumbar spine showed very mild degenerative disc disease but otherwise fairly unremarkable.  Past Medical History  Diagnosis Date  . Kidney stones   . Diabetes mellitus   . Bipolar 1 disorder ([place])   . HTN (hypertension) 05/10/2012  . Preventative health care 05/14/2012  . Renal lithiasis 08/10/2012  . Bipolar disorder, unspecified ([place]) 08/10/2012  . Obesity, unspecified 11/09/2012  . Pain, joint, shoulder, right 04/09/2013  . Diabetes mellitus type 2 in obese ([place]) 01/26/2014  . Diabetes mellitus type 2 in obese ([place]) 06/10/2014  . Nephrolithiasis 06/10/2014   Past Surgical History  Procedure Laterality Date  . Coronary artery bypass graft     Social History  Substance Use Topics  . Smoking status: Never Smoker   . Smokeless tobacco: Never Used  . Alcohol  Use: No   No Known Allergies Family History  Problem Relation Age of Onset  . Hypertension Other   . Diabetes Other      Past medical history, social, surgical and family history all reviewed in electronic medical record.     Review of Systems: No headache, visual changes, nausea, vomiting, diarrhea, constipation, dizziness, abdominal pain, skin rash, fevers, chills, night sweats, weight loss, swollen lymph nodes, body aches, joint swelling, muscle aches, chest pain, shortness of breath, mood changes.   Objective Blood pressure 112/82, pulse 83, height 6\' 2"  (1.88 m), weight 357 lb (161.934 kg), SpO2 96 %.  General: No apparent distress alert and oriented x3 mood and affect normal, dressed appropriately. Morbidly obese HEENT: Pupils equal, extraocular movements intact  Respiratory: Patient's speak in full sentences and does not appear short of breath  Cardiovascular: No lower extremity edema, non tender, no erythema  Skin: Warm dry intact with no signs of infection or rash on extremities or on axial skeleton.  Abdomen: Soft nontender  Neuro: Cranial nerves II through XII are intact, neurovascularly intact in all extremities with 2+ DTRs and 2+ pulses.  Lymph: No lymphadenopathy of posterior or anterior cervical chain or axillae bilaterally.  Gait normal with good balance and coordination.  MSK:  Non tender with full range of motion and good stability and symmetric strength and tone of , elbows, wrist, hip, knee and ankles bilaterally.    Back Exam:  Inspection: . moderately stiff with  increasing kyphosis.patient with range of motion testing was having more spasming Motion: Flexion 30 deg, Extension 25 deg, Side Bending to 25 deg bilaterally,  Rotation to 30 deg bilaterally . Improvement stiffness SLR laying: Negative  XSLR laying: Negative  Palpable tenderness: severe back spasms of the thoracolumbar junction mostly on the right side FABER: negative. Sensory change: Gross  sensation intact to all lumbar and sacral dermatomes.  Reflexes: 2+ at both patellar tendons, 2+ at achilles tendons, Babinski's downgoing.  Strength at foot  Plantar-flexion: 5/5 Dorsi-flexion: 5/5 Eversion: 5/5 Inversion: 5/5  Leg strength  Quad: 5/5 Hamstring: 5/5 Hip flexor: 5/5 Hip abductors: 4/5  Gait unremarkable.  Osteopathic findings Cervical C4 flexed rotated and side bent left Thoracic T3 extended rotated and side bent left with inhaled third rib T7 extended rotated and side bent right T7 extended rotated and side bent left Lumbar L2 flexed rotated and side bent right L4 flexed rotated and side bent right Sacrum left on left    .     Impression and Recommendations:     This case required medical decision making of moderate complexity.

## 2015-02-28 ENCOUNTER — Ambulatory Visit: Payer: Commercial Managed Care - PPO | Admitting: Endocrinology

## 2015-03-11 ENCOUNTER — Other Ambulatory Visit: Payer: Commercial Managed Care - PPO

## 2015-03-13 ENCOUNTER — Ambulatory Visit: Payer: Commercial Managed Care - PPO | Admitting: Endocrinology

## 2015-03-14 ENCOUNTER — Telehealth: Payer: Self-pay | Admitting: Endocrinology

## 2015-03-14 NOTE — Telephone Encounter (Signed)
Please come back for a follow-up appointment in 2 weeks 

## 2015-03-14 NOTE — Telephone Encounter (Signed)
Patient no showed today's appt. Please advise on how to follow up. °A. No follow up necessary. °B. Follow up urgent. Contact patient immediately. °C. Follow up necessary. Contact patient and schedule visit in ___ days. °D. Follow up advised. Contact patient and schedule visit in ____weeks. ° °

## 2015-03-18 ENCOUNTER — Ambulatory Visit: Payer: Commercial Managed Care - PPO | Admitting: Family Medicine

## 2015-03-19 ENCOUNTER — Ambulatory Visit: Payer: Commercial Managed Care - PPO | Admitting: Pulmonary Disease

## 2015-03-20 ENCOUNTER — Ambulatory Visit: Payer: Commercial Managed Care - PPO | Admitting: Family Medicine

## 2015-03-27 ENCOUNTER — Encounter: Payer: Self-pay | Admitting: Family Medicine

## 2015-03-27 ENCOUNTER — Other Ambulatory Visit (INDEPENDENT_AMBULATORY_CARE_PROVIDER_SITE_OTHER): Payer: Commercial Managed Care - PPO

## 2015-03-27 ENCOUNTER — Ambulatory Visit (INDEPENDENT_AMBULATORY_CARE_PROVIDER_SITE_OTHER): Payer: Commercial Managed Care - PPO | Admitting: Family Medicine

## 2015-03-27 VITALS — BP 122/84 | HR 109 | Ht 74.0 in | Wt 355.0 lb

## 2015-03-27 DIAGNOSIS — I10 Essential (primary) hypertension: Secondary | ICD-10-CM | POA: Diagnosis not present

## 2015-03-27 DIAGNOSIS — M546 Pain in thoracic spine: Secondary | ICD-10-CM | POA: Diagnosis not present

## 2015-03-27 DIAGNOSIS — E669 Obesity, unspecified: Secondary | ICD-10-CM

## 2015-03-27 DIAGNOSIS — E785 Hyperlipidemia, unspecified: Secondary | ICD-10-CM

## 2015-03-27 DIAGNOSIS — B3742 Candidal balanitis: Secondary | ICD-10-CM

## 2015-03-27 DIAGNOSIS — E119 Type 2 diabetes mellitus without complications: Secondary | ICD-10-CM

## 2015-03-27 DIAGNOSIS — R000 Tachycardia, unspecified: Secondary | ICD-10-CM

## 2015-03-27 DIAGNOSIS — M9902 Segmental and somatic dysfunction of thoracic region: Secondary | ICD-10-CM

## 2015-03-27 DIAGNOSIS — M9903 Segmental and somatic dysfunction of lumbar region: Secondary | ICD-10-CM | POA: Diagnosis not present

## 2015-03-27 DIAGNOSIS — M999 Biomechanical lesion, unspecified: Secondary | ICD-10-CM

## 2015-03-27 DIAGNOSIS — E1169 Type 2 diabetes mellitus with other specified complication: Secondary | ICD-10-CM

## 2015-03-27 DIAGNOSIS — M9908 Segmental and somatic dysfunction of rib cage: Secondary | ICD-10-CM

## 2015-03-27 LAB — COMPREHENSIVE METABOLIC PANEL
ALBUMIN: 3.9 g/dL (ref 3.5–5.2)
ALT: 39 U/L (ref 0–53)
AST: 32 U/L (ref 0–37)
Alkaline Phosphatase: 75 U/L (ref 39–117)
BUN: 15 mg/dL (ref 6–23)
CALCIUM: 9.3 mg/dL (ref 8.4–10.5)
CHLORIDE: 103 meq/L (ref 96–112)
CO2: 27 meq/L (ref 19–32)
Creatinine, Ser: 0.69 mg/dL (ref 0.40–1.50)
GFR: 138.04 mL/min (ref >60.00)
Glucose, Bld: 135 mg/dL — ABNORMAL HIGH (ref 70–99)
POTASSIUM: 3.9 meq/L (ref 3.5–5.1)
Sodium: 139 mEq/L (ref 135–145)
Total Bilirubin: 0.6 mg/dL (ref 0.2–1.2)
Total Protein: 6.9 g/dL (ref 6.0–8.3)

## 2015-03-27 LAB — LIPID PANEL
CHOL/HDL RATIO: 4
CHOLESTEROL: 166 mg/dL (ref 0–200)
HDL: 37.4 mg/dL — AB (ref >39.00)
LDL CALC: 105 mg/dL — AB (ref 0–99)
NonHDL: 128.38
TRIGLYCERIDES: 118 mg/dL (ref 0.0–149.0)
VLDL: 23.6 mg/dL (ref 0.0–40.0)

## 2015-03-27 LAB — CBC
HEMATOCRIT: 47.7 % (ref 39.0–52.0)
HEMOGLOBIN: 16.1 g/dL (ref 13.0–17.0)
MCHC: 33.7 g/dL (ref 30.0–36.0)
MCV: 86.9 fl (ref 78.0–100.0)
PLATELETS: 236 10*3/uL (ref 150.0–400.0)
RBC: 5.49 Mil/uL (ref 4.22–5.81)
RDW: 14.3 % (ref 11.5–15.5)
WBC: 9.4 10*3/uL (ref 4.0–10.5)

## 2015-03-27 LAB — TSH: TSH: 1.02 u[IU]/mL (ref 0.35–4.50)

## 2015-03-27 LAB — HEMOGLOBIN A1C: Hgb A1c MFr Bld: 7.5 % — ABNORMAL HIGH (ref 4.6–6.5)

## 2015-03-27 LAB — MICROALBUMIN / CREATININE URINE RATIO
Creatinine,U: 101 mg/dL
MICROALB UR: 1 mg/dL (ref 0.0–1.9)
Microalb Creat Ratio: 1 mg/g (ref 0.0–30.0)

## 2015-03-27 NOTE — Assessment & Plan Note (Signed)
Patient is making some strides. Patient is having difficulty though with back spasms tone. We have discussed on a medication previously but he has had side effects previously and does not want to make any drastic changes. Patient will continue with the same medications at this time. Encourage him to continue to try to work on weight loss. We discussed icing regimen. Patient come back and see me again in 4-6 weeks.

## 2015-03-27 NOTE — Progress Notes (Signed)
Pre visit review using our clinic review tool, if applicable. No additional management support is needed unless otherwise documented below in the visit note. 

## 2015-03-27 NOTE — Patient Instructions (Signed)
Good to see you  Tape the finger to the ring finger Ice is your friend You are doing great with the stretching.  COntinue the medicine at night to help with the spasm See me again in 6 weeks.

## 2015-03-27 NOTE — Assessment & Plan Note (Signed)
Decision today to treat with OMT was based on Physical Exam  After verbal consent patient was treated with HVLA, ME, FPR techniques in thoracic, rib,, lumbar areas  Patient tolerated the procedure well with improvement in symptoms  Patient given exercises, stretches and lifestyle modifications  Patient is having significant more difficult to know with the tightness. See if any further imaging will be needed in the future.  Patient will follow up in 6 week

## 2015-03-27 NOTE — Progress Notes (Signed)
[name] [address] [place], [place] [address] Phone: [phone] Subjective:    CC: Back painfollow-up, right shoulder pain follow up  [id number] ZAHIN OHANLON is a 35 y.o. male coming in with complaint of back pain.  Patient was having significant spasm. Patient was given a muscle relaxer that has helped him at night. Patient is not taking any other pain medications on a regular basis. Patient states that overall he is feeling better than his last exam. Continues to workout and try to lose weight. Patient has lost more weight than he was previous exam. Denies any radiation to any of his activities. Overall would state that he is about 15% better.   Does have muscle relaxers for breakthrough pain. He is using them most days of the week she states.   Last imaging and patient's lumbar spine were individually visualized by me. 05/31/2014 lumbar spine showed very mild degenerative disc disease but otherwise fairly unremarkable.  Past Medical History  Diagnosis Date  . Kidney stones   . Diabetes mellitus   . Bipolar 1 disorder ([place])   . HTN (hypertension) 05/10/2012  . Preventative health care 05/14/2012  . Renal lithiasis 08/10/2012  . Bipolar disorder, unspecified ([place]) 08/10/2012  . Obesity, unspecified 11/09/2012  . Pain, joint, shoulder, right 04/09/2013  . Diabetes mellitus type 2 in obese ([place]) 01/26/2014  . Diabetes mellitus type 2 in obese ([place]) 06/10/2014  . Nephrolithiasis 06/10/2014   Past Surgical History  Procedure Laterality Date  . Coronary artery bypass graft     Social History  Substance Use Topics  . Smoking status: Never Smoker   . Smokeless tobacco: Never Used  . Alcohol Use: No   No Known Allergies Family History  Problem Relation Age of Onset  . Hypertension Other   . Diabetes Other      Past medical history, social, surgical and family history all reviewed in electronic medical record.     Review of Systems: No  headache, visual changes, nausea, vomiting, diarrhea, constipation, dizziness, abdominal pain, skin rash, fevers, chills, night sweats, weight loss, swollen lymph nodes, body aches, joint swelling, muscle aches, chest pain, shortness of breath, mood changes.   Objective Blood pressure 122/84, pulse 109, height 6\' 2"  (1.88 m), weight 355 lb (161.027 kg), SpO2 96 %.  General: No apparent distress alert and oriented x3 mood and affect normal, dressed appropriately. Morbidly obese HEENT: Pupils equal, extraocular movements intact  Respiratory: Patient's speak in full sentences and does not appear short of breath  Cardiovascular: No lower extremity edema, non tender, no erythema  Skin: Warm dry intact with no signs of infection or rash on extremities or on axial skeleton.  Abdomen: Soft nontender  Neuro: Cranial nerves II through XII are intact, neurovascularly intact in all extremities with 2+ DTRs and 2+ pulses.  Lymph: No lymphadenopathy of posterior or anterior cervical chain or axillae bilaterally.  Gait normal with good balance and coordination.  MSK:  Non tender with full range of motion and good stability and symmetric strength and tone of , elbows, wrist, hip, knee and ankles bilaterally.    Back Exam:  Inspection: . moderately stiff with increasing kyphosis.patient with range of motion testing was having more spasming Motion: Flexion 30 deg, Extension 25 deg, Side Bending to 25 deg bilaterally,  Rotation to 30 deg bilaterally .  No change in range of motion SLR laying: Negative  XSLR laying: Negative  Palpable tenderness:  Significant less tenderness but  still has some mild tightness of the thoracolumbar junction FABER: negative. Sensory change: Gross sensation intact to all lumbar and sacral dermatomes.  Reflexes: 2+ at both patellar tendons, 2+ at achilles tendons, Babinski's downgoing.  Strength at foot  Plantar-flexion: 5/5 Dorsi-flexion: 5/5 Eversion: 5/5 Inversion: 5/5  Leg  strength  Quad: 5/5 Hamstring: 5/5 Hip flexor: 5/5 Hip abductors: 4/5  Gait unremarkable.  Osteopathic findings Cervical C4 flexed rotated and side bent left  C6 flexed rotated and side bent right Thoracic T3 extended rotated and side bent left with inhaled third rib T7 extended rotated and side bent right Lumbar L2 flexed rotated and side bent right L4 flexed rotated and side bent right Sacrum left on left     Impression and Recommendations:     This case required medical decision making of moderate complexity.

## 2015-04-02 ENCOUNTER — Encounter: Payer: Self-pay | Admitting: Family Medicine

## 2015-04-02 ENCOUNTER — Encounter: Payer: Self-pay | Admitting: Adult Health

## 2015-04-02 ENCOUNTER — Ambulatory Visit (INDEPENDENT_AMBULATORY_CARE_PROVIDER_SITE_OTHER): Payer: Commercial Managed Care - PPO | Admitting: Adult Health

## 2015-04-02 ENCOUNTER — Telehealth: Payer: Self-pay | Admitting: Adult Health

## 2015-04-02 ENCOUNTER — Ambulatory Visit (INDEPENDENT_AMBULATORY_CARE_PROVIDER_SITE_OTHER): Payer: Commercial Managed Care - PPO | Admitting: Family Medicine

## 2015-04-02 VITALS — BP 110/76 | HR 82 | Temp 97.7°F | Ht 74.0 in | Wt 360.0 lb

## 2015-04-02 VITALS — BP 132/78 | HR 84 | Temp 97.9°F | Ht 75.0 in | Wt 360.0 lb

## 2015-04-02 DIAGNOSIS — E669 Obesity, unspecified: Secondary | ICD-10-CM

## 2015-04-02 DIAGNOSIS — G4733 Obstructive sleep apnea (adult) (pediatric): Secondary | ICD-10-CM | POA: Diagnosis not present

## 2015-04-02 DIAGNOSIS — M62838 Other muscle spasm: Secondary | ICD-10-CM

## 2015-04-02 DIAGNOSIS — R000 Tachycardia, unspecified: Secondary | ICD-10-CM | POA: Diagnosis not present

## 2015-04-02 DIAGNOSIS — E785 Hyperlipidemia, unspecified: Secondary | ICD-10-CM | POA: Diagnosis not present

## 2015-04-02 DIAGNOSIS — B3742 Candidal balanitis: Secondary | ICD-10-CM

## 2015-04-02 DIAGNOSIS — I10 Essential (primary) hypertension: Secondary | ICD-10-CM

## 2015-04-02 DIAGNOSIS — E119 Type 2 diabetes mellitus without complications: Secondary | ICD-10-CM

## 2015-04-02 DIAGNOSIS — E1169 Type 2 diabetes mellitus with other specified complication: Secondary | ICD-10-CM

## 2015-04-02 DIAGNOSIS — R350 Frequency of micturition: Secondary | ICD-10-CM

## 2015-04-02 DIAGNOSIS — M6248 Contracture of muscle, other site: Secondary | ICD-10-CM

## 2015-04-02 MED ORDER — TIZANIDINE HCL 4 MG PO CAPS: 4.0000 mg | ORAL_CAPSULE | Freq: Three times a day (TID) | ORAL | Status: DC | PRN

## 2015-04-02 MED ORDER — FLUCONAZOLE 150 MG PO TABS: ORAL_TABLET | ORAL | Status: DC

## 2015-04-02 MED ORDER — METFORMIN HCL 500 MG PO TABS
500.0000 mg | ORAL_TABLET | Freq: Three times a day (TID) | ORAL | Status: DC
Start: 2015-04-02 — End: 2015-07-22

## 2015-04-02 MED ORDER — SITAGLIPTIN PHOSPHATE 100 MG PO TABS: 100.0000 mg | ORAL_TABLET | Freq: Every day | ORAL | Status: DC

## 2015-04-02 MED ORDER — VENLAFAXINE HCL ER 75 MG PO CP24: 75.0000 mg | ORAL_CAPSULE | Freq: Every day | ORAL | Status: DC

## 2015-04-02 MED ORDER — LISINOPRIL 5 MG PO TABS: 5.0000 mg | ORAL_TABLET | Freq: Every day | ORAL | Status: DC

## 2015-04-02 MED ORDER — PRAVASTATIN SODIUM 10 MG PO TABS: 10.0000 mg | ORAL_TABLET | Freq: Every day | ORAL | Status: DC

## 2015-04-02 MED ORDER — NYSTATIN 100000 UNIT/GM EX CREA: 1.0000 "application " | TOPICAL_CREAM | Freq: Three times a day (TID) | CUTANEOUS | Status: AC

## 2015-04-02 MED ORDER — LAMOTRIGINE 100 MG PO TABS: 100.0000 mg | ORAL_TABLET | Freq: Every day | ORAL | Status: DC

## 2015-04-02 NOTE — Progress Notes (Signed)
Subjective:    Patient ID: Colin Bennett, male    DOB: [birth year], 35 y.o.   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    PSG showed mod OSA- AHI 17/h, nadir desatn 77%, corrected by CPAP 16 cm    04/02/2015 Follow up OSA  Pt returns for a annual OSA follow up  Wears  CPAP every ngiht, for at least 5-6 hrs.  Feels rested  with CPAP w/ less daytime sleepiness/fatigue.  Discussed not driving when sleepy.  His DME is [facility].  Download last 90 days shows good compliance with avg usage ~6hr . Good control with minimal leaks. AHI 0.8.  Discussed wt loss.  Denies chest pain , orthopnea or edema.   Past Medical History  Diagnosis Date  . Kidney stones   . Diabetes mellitus   . Bipolar 1 disorder ([place])   . HTN (hypertension) 05/10/2012  . Preventative health care 05/14/2012  . Renal lithiasis 08/10/2012  . Bipolar disorder, unspecified ([place]) 08/10/2012  . Obesity, unspecified 11/09/2012  . Pain, joint, shoulder, right 04/09/2013  . Diabetes mellitus type 2 in obese ([place]) 01/26/2014  . Diabetes mellitus type 2 in obese ([place]) 06/10/2014  . Nephrolithiasis 06/10/2014   Current Outpatient Prescriptions on File Prior to Visit  Medication Sig Dispense Refill  . canagliflozin (INVOKANA) 300 MG TABS tablet Take 150 mg by mouth daily before breakfast. 15 tablet 11  . Ibuprofen-Famotidine 800-26.6 MG TABS Take 1 tablet 3 times daily as needed. 270 tablet 3  . lamoTRIgine (LAMICTAL) 100 MG tablet Take 1 tablet (100 mg total) by mouth daily. 90 tablet 1  . lisinopril (PRINIVIL,ZESTRIL) 5 MG tablet Take 1 tablet (5 mg total) by mouth daily. 90 tablet 3  . metFORMIN (GLUCOPHAGE) 500 MG tablet Take 1 tablet (500 mg total) by mouth 2 (two) times daily with a meal. 180 tablet 2  . pravastatin (PRAVACHOL) 10 MG tablet Take 1 tablet (10 mg total) by mouth daily. 90 tablet 2  . sitaGLIPtin (JANUVIA) 100 MG tablet Take 1 tablet (100 mg total) by mouth daily. 30 tablet 5   . tiZANidine (ZANAFLEX) 4 MG capsule Take 1 capsule (4 mg total) by mouth 3 (three) times daily as needed for muscle spasms. 30 capsule 0  . venlafaxine XR (EFFEXOR XR) 75 MG 24 hr capsule Take 1 capsule (75 mg total) by mouth daily with breakfast. 30 capsule 3  . metoprolol succinate (TOPROL-XL) 50 MG 24 hr tablet Take 1 tablet (50 mg total) by mouth daily. Take with or immediately following a meal. (Patient not taking: Reported on 04/02/2015) 30 tablet 5   No current facility-administered medications on file prior to visit.       Review of Systems neg for any significant sore throat, dysphagia, itching, sneezing, nasal congestion or excess/ purulent secretions, fever, chills, sweats, unintended wt loss, pleuritic or exertional cp, hempoptysis, orthopnea pnd or change in chronic leg swelling. Also denies presyncope, palpitations, heartburn, abdominal pain, nausea, vomiting, diarrhea or change in bowel or urinary habits, dysuria,hematuria, rash, arthralgias, visual complaints, headache, numbness weakness or ataxia.     Objective:   Physical Exam Filed Vitals:   04/02/15 1628  BP: 110/76  Pulse: 82  Temp: 97.7 F (36.5 C)  TempSrc: Oral  Height: 6\' 2"  (1.88 m)  Weight: 360 lb (163.295 kg)  SpO2: 96%    Gen. Pleasant, obese, in no distress ENT - no lesions, no post nasal drip Class 2-3  MP  Neck: No JVD, no thyromegaly, no carotid bruits Lungs: no use of accessory muscles, no dullness to percussion, decreased without rales or rhonchi  Cardiovascular: Rhythm regular, heart sounds  normal, no murmurs or gallops, no peripheral edema Musculoskeletal: No deformities, no cyanosis or clubbing , no tremors   Keltie Labell NP-C  [facility]  [phone]       Assessment & Plan:

## 2015-04-02 NOTE — Assessment & Plan Note (Signed)
Wt loss  

## 2015-04-02 NOTE — Assessment & Plan Note (Signed)
Doing very well on CPAP   Plan  ontinue on CPAP At bedtime  .  Keep up good work wearing CPAP .  Order sent to DME for mask supplies  Follow up Dr. Elsworth Soho  In 1 year and As needed

## 2015-04-02 NOTE — Patient Instructions (Signed)

## 2015-04-02 NOTE — Patient Instructions (Signed)
Continue on CPAP At bedtime  .  Keep up good work wearing CPAP .  Order sent to DME for mask supplies  Follow up Dr. Alva  In 1 year and As needed   

## 2015-04-02 NOTE — Progress Notes (Signed)
Pre visit review using our clinic review tool, if applicable. No additional management support is needed unless otherwise documented below in the visit note. 

## 2015-04-02 NOTE — Telephone Encounter (Signed)
error 

## 2015-04-03 NOTE — Assessment & Plan Note (Signed)
Encouraged heart healthy diet, increase exercise, avoid trans fats, consider a krill oil cap daily 

## 2015-04-03 NOTE — Assessment & Plan Note (Signed)
Well controlled, no changes to meds. Encouraged heart healthy diet such as the DASH diet and exercise as tolerated.  °

## 2015-04-03 NOTE — Assessment & Plan Note (Signed)
Encouraged DASH diet, decrease po intake and increase exercise as tolerated. Needs 7-8 hours of sleep nightly. Avoid trans fats, eat small, frequent meals every 4-5 hours with lean proteins, complex carbs and healthy fats. Minimize simple carbs, GMO foods. Good weight loss since last visit.

## 2015-04-03 NOTE — Assessment & Plan Note (Signed)
hgba1c improving., minimize simple carbs. Increase exercise as tolerated. Continue current meds but increase Metformin to tid

## 2015-04-03 NOTE — Assessment & Plan Note (Signed)
Doing better but still recurs refilled Tizanidine today. Encouraged moist heat and gentle stretching as tolerated. May try NSAIDs and prescription meds as directed and report if symptoms worsen or seek immediate care

## 2015-04-03 NOTE — Progress Notes (Signed)
Patient ID: Colin Bennett, male   DOB: 1981-01-10, 35 y.o.   MRN: [id number]   Subjective:    Patient ID: Colin Bennett, male    DOB: November 23, 1980, 35 y.o.   MRN: [id number]  Chief Complaint  Patient presents with  . Follow-up    HPI Patient is in today for follow up. Is struggling with pain at present time. His foreskin is macerated with yeast again. Difficulty retracting foreskin. No recent illness otherwise. No fevers or chills. Is complaining of some low back pain intermittently. No recent fall or injury. No radiculopathy. No incontinence. Also complains of some right hand pain, no injury. Denies CP/palp/SOB/HA/congestion/fevers/GI c/o. Taking meds as prescribed  Past Medical History  Diagnosis Date  . Kidney stones   . Diabetes mellitus   . Bipolar 1 disorder ([place])   . HTN (hypertension) 05/10/2012  . Preventative health care 05/14/2012  . Renal lithiasis 08/10/2012  . Bipolar disorder, unspecified ([place]) 08/10/2012  . Obesity, unspecified 11/09/2012  . Pain, joint, shoulder, right 04/09/2013  . Diabetes mellitus type 2 in obese ([place]) 01/26/2014  . Diabetes mellitus type 2 in obese ([place]) 06/10/2014  . Nephrolithiasis 06/10/2014    Past Surgical History  Procedure Laterality Date  . Coronary artery bypass graft      Family History  Problem Relation Age of Onset  . Hypertension Other   . Diabetes Other     Social History   Social History  . Marital Status: Married    Spouse Name: N/A  . Number of Children: 0  . Years of Education: N/A   Occupational History  . Collateral recovery    Social History Main Topics  . Smoking status: Never Smoker   . Smokeless tobacco: Never Used  . Alcohol Use: No  . Drug Use: No  . Sexual Activity: Not on file   Other Topics Concern  . Not on file   Social History Narrative    Outpatient Prescriptions Prior to Visit  Medication Sig Dispense Refill  . canagliflozin (INVOKANA) 300 MG TABS tablet Take 150 mg by mouth daily before  breakfast. 15 tablet 11  . Ibuprofen-Famotidine 800-26.6 MG TABS Take 1 tablet 3 times daily as needed. 270 tablet 3  . metoprolol succinate (TOPROL-XL) 50 MG 24 hr tablet Take 1 tablet (50 mg total) by mouth daily. Take with or immediately following a meal. (Patient not taking: Reported on 04/02/2015) 30 tablet 5  . lamoTRIgine (LAMICTAL) 100 MG tablet Take 1 tablet (100 mg total) by mouth daily. 90 tablet 1  . lisinopril (PRINIVIL,ZESTRIL) 5 MG tablet Take 1 tablet (5 mg total) by mouth daily. 90 tablet 3  . metFORMIN (GLUCOPHAGE) 500 MG tablet Take 1 tablet (500 mg total) by mouth 2 (two) times daily with a meal. 180 tablet 2  . pravastatin (PRAVACHOL) 10 MG tablet Take 1 tablet (10 mg total) by mouth daily. 90 tablet 2  . sitaGLIPtin (JANUVIA) 100 MG tablet Take 1 tablet (100 mg total) by mouth daily. 30 tablet 5  . tiZANidine (ZANAFLEX) 4 MG capsule Take 1 capsule (4 mg total) by mouth 3 (three) times daily as needed for muscle spasms. 30 capsule 0  . venlafaxine XR (EFFEXOR XR) 75 MG 24 hr capsule Take 1 capsule (75 mg total) by mouth daily with breakfast. 30 capsule 3   No facility-administered medications prior to visit.    No Known Allergies  Review of Systems  Constitutional: Positive for malaise/fatigue. Negative for fever.  HENT: Negative for  congestion.   Eyes: Negative for blurred vision.  Respiratory: Negative for shortness of breath.   Cardiovascular: Negative for chest pain, palpitations and leg swelling.  Gastrointestinal: Negative for nausea, abdominal pain and blood in stool.  Genitourinary: Positive for dysuria. Negative for frequency.  Musculoskeletal: Positive for back pain and joint pain. Negative for falls.  Skin: Negative for rash.  Neurological: Negative for dizziness, loss of consciousness and headaches.  Endo/Heme/Allergies: Negative for environmental allergies.  Psychiatric/Behavioral: Negative for depression. The patient is not nervous/anxious.          Objective:    Physical Exam  Constitutional: He is oriented to person, place, and time. He appears well-developed and well-nourished. No distress.  HENT:  Head: Normocephalic and atraumatic.  Nose: Nose normal.  Eyes: Right eye exhibits no discharge. Left eye exhibits no discharge.  Neck: Normal range of motion. Neck supple.  Cardiovascular: Normal rate and regular rhythm.   No murmur heard. Pulmonary/Chest: Effort normal and breath sounds normal.  Abdominal: Soft. Bowel sounds are normal. There is no tenderness.  Genitourinary:  Uncircumcised male. Foreskin macerated with splitting skin and surrounding erythema  Musculoskeletal: He exhibits no edema.  Neurological: He is alert and oriented to person, place, and time.  Skin: Skin is warm and dry.  Psychiatric: He has a normal mood and affect.  Nursing note and vitals reviewed.   BP 132/78 mmHg  Pulse 84  Temp(Src) 97.9 F (36.6 C) (Oral)  Ht 6\' 3"  (1.905 m)  Wt 360 lb (163.295 kg)  BMI 45.00 kg/m2  SpO2 96% Wt Readings from Last 3 Encounters:  04/02/15 360 lb (163.295 kg)  04/02/15 360 lb (163.295 kg)  03/27/15 355 lb (161.027 kg)     Lab Results  Component Value Date   WBC 9.4 03/27/2015   HGB 16.1 03/27/2015   HCT 47.7 03/27/2015   PLT 236.0 03/27/2015   GLUCOSE 135* 03/27/2015   CHOL 166 03/27/2015   TRIG 118.0 03/27/2015   HDL 37.40* 03/27/2015   LDLCALC 105* 03/27/2015   ALT 39 03/27/2015   AST 32 03/27/2015   NA 139 03/27/2015   K 3.9 03/27/2015   CL 103 03/27/2015   CREATININE 0.69 03/27/2015   BUN 15 03/27/2015   CO2 27 03/27/2015   TSH 1.02 03/27/2015   HGBA1C 7.5* 03/27/2015   MICROALBUR 1.0 03/27/2015    Lab Results  Component Value Date   TSH 1.02 03/27/2015   Lab Results  Component Value Date   WBC 9.4 03/27/2015   HGB 16.1 03/27/2015   HCT 47.7 03/27/2015   MCV 86.9 03/27/2015   PLT 236.0 03/27/2015   Lab Results  Component Value Date   NA 139 03/27/2015   K 3.9 03/27/2015    CO2 27 03/27/2015   GLUCOSE 135* 03/27/2015   BUN 15 03/27/2015   CREATININE 0.69 03/27/2015   BILITOT 0.6 03/27/2015   ALKPHOS 75 03/27/2015   AST 32 03/27/2015   ALT 39 03/27/2015   PROT 6.9 03/27/2015   ALBUMIN 3.9 03/27/2015   CALCIUM 9.3 03/27/2015   GFR 138.04 03/27/2015   Lab Results  Component Value Date   CHOL 166 03/27/2015   Lab Results  Component Value Date   HDL 37.40* 03/27/2015   Lab Results  Component Value Date   LDLCALC 105* 03/27/2015   Lab Results  Component Value Date   TRIG 118.0 03/27/2015   Lab Results  Component Value Date   CHOLHDL 4 03/27/2015   Lab Results  Component Value  Date   HGBA1C 7.5* 03/27/2015       Assessment & Plan:   Problem List Items Addressed This Visit    Candidal balanitis    Very painful. Is started on Diflucan and Nystatin cream. Start NOW probiotics and if no improvement will need referral to urology for further consideration      Relevant Medications   fluconazole (DIFLUCAN) 150 MG tablet   nystatin cream (MYCOSTATIN)   Diabetes mellitus type 2 in obese (HCC)    hgba1c improving., minimize simple carbs. Increase exercise as tolerated. Continue current meds but increase Metformin to tid      Relevant Medications   sitaGLIPtin (JANUVIA) 100 MG tablet   pravastatin (PRAVACHOL) 10 MG tablet   lisinopril (PRINIVIL,ZESTRIL) 5 MG tablet   metFORMIN (GLUCOPHAGE) 500 MG tablet   Frequent urination   Relevant Orders   CBC   TSH   Comprehensive metabolic panel   Lipid panel   Hemoglobin A1c   HTN (hypertension)    Well controlled, no changes to meds. Encouraged heart healthy diet such as the DASH diet and exercise as tolerated.       Relevant Medications   pravastatin (PRAVACHOL) 10 MG tablet   lisinopril (PRINIVIL,ZESTRIL) 5 MG tablet   Other Relevant Orders   CBC   TSH   Comprehensive metabolic panel   Lipid panel   Hemoglobin A1c   Hyperlipidemia - Primary    Encouraged heart healthy diet, increase  exercise, avoid trans fats, consider a krill oil cap daily      Relevant Medications   pravastatin (PRAVACHOL) 10 MG tablet   lisinopril (PRINIVIL,ZESTRIL) 5 MG tablet   Other Relevant Orders   CBC   TSH   Comprehensive metabolic panel   Lipid panel   Hemoglobin A1c   Muscle spasms of head and/or neck    Doing better but still recurs refilled Tizanidine today. Encouraged moist heat and gentle stretching as tolerated. May try NSAIDs and prescription meds as directed and report if symptoms worsen or seek immediate care      Obesity    Encouraged DASH diet, decrease po intake and increase exercise as tolerated. Needs 7-8 hours of sleep nightly. Avoid trans fats, eat small, frequent meals every 4-5 hours with lean proteins, complex carbs and healthy fats. Minimize simple carbs, GMO foods. Good weight loss since last visit.      Relevant Medications   sitaGLIPtin (JANUVIA) 100 MG tablet   metFORMIN (GLUCOPHAGE) 500 MG tablet   Other Relevant Orders   CBC   TSH   Comprehensive metabolic panel   Lipid panel   Hemoglobin A1c   Tachycardia   Relevant Orders   CBC   TSH   Comprehensive metabolic panel   Lipid panel   Hemoglobin A1c      I have changed Mr. Pursel's metFORMIN. I am also having him start on fluconazole and nystatin cream. Additionally, I am having him maintain his metoprolol succinate, canagliflozin, Ibuprofen-Famotidine, sitaGLIPtin, pravastatin, lisinopril, lamoTRIgine, venlafaxine XR, and tiZANidine.  Meds ordered this encounter  Medications  . sitaGLIPtin (JANUVIA) 100 MG tablet    Sig: Take 1 tablet (100 mg total) by mouth daily.    Dispense:  30 tablet    Refill:  6  . pravastatin (PRAVACHOL) 10 MG tablet    Sig: Take 1 tablet (10 mg total) by mouth daily.    Dispense:  90 tablet    Refill:  2  . lisinopril (PRINIVIL,ZESTRIL) 5 MG tablet  Sig: Take 1 tablet (5 mg total) by mouth daily.    Dispense:  90 tablet    Refill:  2  . lamoTRIgine (LAMICTAL)  100 MG tablet    Sig: Take 1 tablet (100 mg total) by mouth daily.    Dispense:  90 tablet    Refill:  1  . venlafaxine XR (EFFEXOR XR) 75 MG 24 hr capsule    Sig: Take 1 capsule (75 mg total) by mouth daily with breakfast.    Dispense:  30 capsule    Refill:  3  . metFORMIN (GLUCOPHAGE) 500 MG tablet    Sig: Take 1 tablet (500 mg total) by mouth 3 (three) times daily between meals.    Dispense:  270 tablet    Refill:  1  . fluconazole (DIFLUCAN) 150 MG tablet    Sig: 2 tabs po daily x 3 days then 1 tab po weekly x 3 weeks    Dispense:  9 tablet    Refill:  1  . tiZANidine (ZANAFLEX) 4 MG capsule    Sig: Take 1 capsule (4 mg total) by mouth 3 (three) times daily as needed for muscle spasms.    Dispense:  30 capsule    Refill:  2  . nystatin cream (MYCOSTATIN)    Sig: Apply 1 application topically 3 (three) times daily.    Dispense:  30 g    Refill:  5     Penni Homans, MD

## 2015-04-03 NOTE — Assessment & Plan Note (Signed)
Very painful. Is started on Diflucan and Nystatin cream. Start NOW probiotics and if no improvement will need referral to urology for further consideration

## 2015-04-04 NOTE — Progress Notes (Signed)
Reviewed & agree with plan  

## 2015-05-08 ENCOUNTER — Other Ambulatory Visit (INDEPENDENT_AMBULATORY_CARE_PROVIDER_SITE_OTHER): Payer: Commercial Managed Care - PPO

## 2015-05-08 ENCOUNTER — Ambulatory Visit (INDEPENDENT_AMBULATORY_CARE_PROVIDER_SITE_OTHER): Payer: Commercial Managed Care - PPO | Admitting: Family Medicine

## 2015-05-08 ENCOUNTER — Encounter: Payer: Self-pay | Admitting: Family Medicine

## 2015-05-08 VITALS — BP 108/72 | HR 77 | Ht 75.0 in | Wt 355.0 lb

## 2015-05-08 DIAGNOSIS — IMO0001 Reserved for inherently not codable concepts without codable children: Secondary | ICD-10-CM

## 2015-05-08 DIAGNOSIS — M9902 Segmental and somatic dysfunction of thoracic region: Secondary | ICD-10-CM | POA: Diagnosis not present

## 2015-05-08 DIAGNOSIS — M791 Myalgia: Secondary | ICD-10-CM

## 2015-05-08 DIAGNOSIS — M9908 Segmental and somatic dysfunction of rib cage: Secondary | ICD-10-CM

## 2015-05-08 DIAGNOSIS — M609 Myositis, unspecified: Secondary | ICD-10-CM

## 2015-05-08 DIAGNOSIS — M62838 Other muscle spasm: Secondary | ICD-10-CM

## 2015-05-08 DIAGNOSIS — M9903 Segmental and somatic dysfunction of lumbar region: Secondary | ICD-10-CM | POA: Diagnosis not present

## 2015-05-08 DIAGNOSIS — M6248 Contracture of muscle, other site: Secondary | ICD-10-CM | POA: Diagnosis not present

## 2015-05-08 DIAGNOSIS — M999 Biomechanical lesion, unspecified: Secondary | ICD-10-CM

## 2015-05-08 LAB — CBC WITH DIFFERENTIAL/PLATELET
BASOS ABS: 0 10*3/uL (ref 0.0–0.1)
Basophils Relative: 0.6 % (ref 0.0–3.0)
EOS ABS: 0.3 10*3/uL (ref 0.0–0.7)
Eosinophils Relative: 3.4 % (ref 0.0–5.0)
HEMATOCRIT: 46.4 % (ref 39.0–52.0)
Hemoglobin: 15.7 g/dL (ref 13.0–17.0)
LYMPHS ABS: 1.8 10*3/uL (ref 0.7–4.0)
LYMPHS PCT: 21.3 % (ref 12.0–46.0)
MCHC: 33.9 g/dL (ref 30.0–36.0)
MCV: 86.4 fl (ref 78.0–100.0)
MONOS PCT: 9 % (ref 3.0–12.0)
Monocytes Absolute: 0.8 10*3/uL (ref 0.1–1.0)
NEUTROS ABS: 5.5 10*3/uL (ref 1.4–7.7)
NEUTROS PCT: 65.7 % (ref 43.0–77.0)
PLATELETS: 239 10*3/uL (ref 150.0–400.0)
RBC: 5.37 Mil/uL (ref 4.22–5.81)
RDW: 13.8 % (ref 11.5–15.5)
WBC: 8.4 10*3/uL (ref 4.0–10.5)

## 2015-05-08 LAB — CK: CK TOTAL: 212 U/L (ref 7–232)

## 2015-05-08 LAB — VITAMIN D 25 HYDROXY (VIT D DEFICIENCY, FRACTURES): VITD: 30.14 ng/mL (ref 30.00–100.00)

## 2015-05-08 MED ORDER — GABAPENTIN 100 MG PO CAPS: 200.0000 mg | ORAL_CAPSULE | Freq: Every day | ORAL | Status: DC

## 2015-05-08 MED ORDER — KETOROLAC TROMETHAMINE 60 MG/2ML IM SOLN
60.0000 mg | Freq: Once | INTRAMUSCULAR | Status: AC
  Administered 2015-05-08: 60 mg via INTRAMUSCULAR

## 2015-05-08 NOTE — Progress Notes (Signed)
Pre visit review using our clinic review tool, if applicable. No additional management support is needed unless otherwise documented below in the visit note. 

## 2015-05-08 NOTE — Assessment & Plan Note (Signed)
Decision today to treat with OMT was based on Physical Exam  After verbal consent patient was treated with HVLA, ME, FPR techniques in thoracic, rib,, lumbar areas  Patient tolerated the procedure well with improvement in symptoms  Patient given exercises, stretches and lifestyle modifications  Patient is having significant more difficult to know with the tightness. See if any further imaging will be needed in the future.  Patient will follow up in 2-3 week

## 2015-05-08 NOTE — Progress Notes (Signed)
[name] [place], [place] [address] Phone: [phone] Subjective:    CC: Back painfollow-up, right shoulder pain follow up  [id number] Colin Bennett is a 35 y.o. male coming in with complaint of back pain.  Having more of an exacerbation. Much more tight recently. Has not change anything some not working out as frequently. Has had no significant medication changes. Continues to have difficulty with even regular daily activities. Patient's weight loss goals has plateaued at the moment. No diet changes. Continues all medications he states very regularly. Not having responsibility anti-inflammatory the muscle relaxer today.      Last imaging and patient's lumbar spine were individually visualized by me. 05/31/2014 lumbar spine showed very mild degenerative disc disease but otherwise fairly unremarkable.  Past Medical History  Diagnosis Date  . Kidney stones   . Diabetes mellitus   . Bipolar 1 disorder ([place])   . HTN (hypertension) 05/10/2012  . Preventative health care 05/14/2012  . Renal lithiasis 08/10/2012  . Bipolar disorder, unspecified ([place]) 08/10/2012  . Obesity, unspecified 11/09/2012  . Pain, joint, shoulder, right 04/09/2013  . Diabetes mellitus type 2 in obese ([place]) 01/26/2014  . Diabetes mellitus type 2 in obese ([place]) 06/10/2014  . Nephrolithiasis 06/10/2014   Past Surgical History  Procedure Laterality Date  . Coronary artery bypass graft     Social History  Substance Use Topics  . Smoking status: Never Smoker   . Smokeless tobacco: Never Used  . Alcohol Use: No   No Known Allergies Family History  Problem Relation Age of Onset  . Hypertension Other   . Diabetes Other      Past medical history, social, surgical and family history all reviewed in electronic medical record.     Review of Systems: No headache, visual changes, nausea, vomiting, diarrhea, constipation, dizziness, abdominal pain, skin rash, fevers,  chills, night sweats, weight loss, swollen lymph nodes, body aches, joint swelling, muscle aches, chest pain, shortness of breath, mood changes.   Objective Blood pressure 108/72, pulse 77, height 6\' 3"  (1.905 m), weight 355 lb (161.027 kg), SpO2 96 %.  General: No apparent distress alert and oriented x3 mood and affect normal, dressed appropriately. Morbidly obese HEENT: Pupils equal, extraocular movements intact  Respiratory: Patient's speak in full sentences and does not appear short of breath  Cardiovascular: No lower extremity edema, non tender, no erythema  Skin: Warm dry intact with no signs of infection or rash on extremities or on axial skeleton.  Abdomen: Soft nontender  Neuro: Cranial nerves II through XII are intact, neurovascularly intact in all extremities with 2+ DTRs and 2+ pulses.  Lymph: No lymphadenopathy of posterior or anterior cervical chain or axillae bilaterally.  Gait normal with good balance and coordination.  MSK:  Non tender with full range of motion and good stability and symmetric strength and tone of , elbows, wrist, hip, knee and ankles bilaterally.    Back Exam:  Inspection: . moderately stiff with increasing kyphosis.patient with range of motion testing was having more spasming Motion: Flexion 30 deg, Extension 15 deg, Side Bending to 15 deg bilaterally,  Rotation to 20 deg bilaterally .  No change in range of motion SLR laying: Negative  XSLR laying: Negative  Palpable tenderness:  Significant tightness of the thoracic or lumbar junction especially of the lumbar spine as well as the hip girdle. More than previous exam FABER: negative. Sensory change: Gross sensation intact to all lumbar and sacral dermatomes.  Reflexes: 2+ at both patellar tendons, 2+ at achilles tendons, Babinski's downgoing.  Strength at foot  Difficult to do strengthening exercises secondary to the amount of tightness..  Osteopathic findings Cervical C2 flexed rotated and side bent  left  C6 flexed rotated and side bent right Thoracic T3 extended rotated and side bent left with inhaled third rib T7 extended rotated and side bent right Lumbar L2 flexed rotated and side bent right L4 flexed rotated and side bent right Sacrum left on left     Impression and Recommendations:     This case required medical decision making of moderate complexity.

## 2015-05-08 NOTE — Assessment & Plan Note (Signed)
Continues to have significant amount of musculoskeletal pain. Seems to be out of proportion. No significant inciting factors. Labs ordered today to see if there is any type of muscle breakdown or any type of anemia is a could be contribute in. Likely multifactorial secondary to poor core strength, muscle imbalances as well as patient's other comorbidities. Encourage patient to back into the gym. Injection given today. Started on gabapentin to see if this will be beneficial. Differential includes a chronic pain syndrome versus fibromyalgia. Has responded well to osteopathic medical raise in the past. Patient will come back in 2-3 weeks for further evaluation and treatment.

## 2015-05-08 NOTE — Patient Instructions (Signed)
Good to see you  We will get labs today  Ice is good.  Gabapentin 200mg  at night Vivi Ferns could help with some dry needling.  [phone] Try to get back in the gym See me again in 2-3 weeks.

## 2015-05-09 LAB — ANA: Anti Nuclear Antibody(ANA): NEGATIVE

## 2015-05-29 ENCOUNTER — Encounter: Payer: Self-pay | Admitting: Family Medicine

## 2015-05-29 ENCOUNTER — Ambulatory Visit (INDEPENDENT_AMBULATORY_CARE_PROVIDER_SITE_OTHER): Payer: Commercial Managed Care - PPO | Admitting: Family Medicine

## 2015-05-29 VITALS — BP 116/84 | HR 83 | Ht 75.0 in | Wt 353.0 lb

## 2015-05-29 DIAGNOSIS — M546 Pain in thoracic spine: Secondary | ICD-10-CM

## 2015-05-29 DIAGNOSIS — M9908 Segmental and somatic dysfunction of rib cage: Secondary | ICD-10-CM

## 2015-05-29 DIAGNOSIS — M9902 Segmental and somatic dysfunction of thoracic region: Secondary | ICD-10-CM | POA: Diagnosis not present

## 2015-05-29 DIAGNOSIS — M9903 Segmental and somatic dysfunction of lumbar region: Secondary | ICD-10-CM | POA: Diagnosis not present

## 2015-05-29 DIAGNOSIS — M999 Biomechanical lesion, unspecified: Secondary | ICD-10-CM

## 2015-05-29 NOTE — Progress Notes (Signed)
[facility] [place] [place], [place] [address] Phone: [phone] Subjective:    CC: Back painfollow-up, right shoulder pain follow up  [id number] Colin Bennett is a 35 y.o. male coming in with complaint of back pain.  Having more of an exacerbation. Started on gabapentin recently. States that this is improved Still has some tightness overall special in the right lower back. Not as severe as what it was previously though. Patient did have labs at last exam to rule out any autoimmune diseases. Reviewed and independently by me with no significant findings. No signs of autoimmune diseases.      Last imaging and patient's lumbar spine were individually visualized by me. 05/31/2014 lumbar spine showed very mild degenerative disc disease but otherwise fairly unremarkable.  Past Medical History  Diagnosis Date  . Kidney stones   . Diabetes mellitus   . Bipolar 1 disorder ([place])   . HTN (hypertension) 05/10/2012  . Preventative health care 05/14/2012  . Renal lithiasis 08/10/2012  . Bipolar disorder, unspecified ([place]) 08/10/2012  . Obesity, unspecified 11/09/2012  . Pain, joint, shoulder, right 04/09/2013  . Diabetes mellitus type 2 in obese ([place]) 01/26/2014  . Diabetes mellitus type 2 in obese ([place]) 06/10/2014  . Nephrolithiasis 06/10/2014   Past Surgical History  Procedure Laterality Date  . Coronary artery bypass graft     Social History  Substance Use Topics  . Smoking status: Never Smoker   . Smokeless tobacco: Never Used  . Alcohol Use: No   No Known Allergies Family History  Problem Relation Age of Onset  . Hypertension Other   . Diabetes Other      Past medical history, social, surgical and family history all reviewed in electronic medical record.     Review of Systems: No headache, visual changes, nausea, vomiting, diarrhea, constipation, dizziness, abdominal pain, skin rash, fevers, chills, night sweats, weight loss, swollen lymph  nodes, body aches, joint swelling, muscle aches, chest pain, shortness of breath, mood changes.   Objective Blood pressure 116/84, pulse 83, height 6\' 3"  (1.905 m), weight 353 lb (160.12 kg), SpO2 96 %.  General: No apparent distress alert and oriented x3 mood and affect normal, dressed appropriately. Morbidly obese HEENT: Pupils equal, extraocular movements intact  Respiratory: Patient's speak in full sentences and does not appear short of breath  Cardiovascular: No lower extremity edema, non tender, no erythema  Skin: Warm dry intact with no signs of infection or rash on extremities or on axial skeleton.  Abdomen: Soft nontender  Neuro: Cranial nerves II through XII are intact, neurovascularly intact in all extremities with 2+ DTRs and 2+ pulses.  Lymph: No lymphadenopathy of posterior or anterior cervical chain or axillae bilaterally.  Gait normal with good balance and coordination.  MSK:  Non tender with full range of motion and good stability and symmetric strength and tone of , elbows, wrist, hip, knee and ankles bilaterally.    Back Exam:  Inspection: . Continue stiffness of the kyphosis Motion: Flexion 30 deg, Extension 15 deg, Side Bending to 15 deg bilaterally,  Rotation to 20 deg bilaterally .  No change in range of motion with continued tightness SLR laying: Negative  XSLR laying: Negative  Palpable tenderness:  Less tenderness than previous exam but continues to have her spinal musculature tenderness seems to be out of proportion. FABER: negative. Sensory change: Gross sensation intact to all lumbar and sacral dermatomes.  Reflexes: 2+ at both patellar tendons, 2+ at achilles  tendons, Babinski's downgoing.  Strength at foot  Seems symmetric bilaterally  Osteopathic findings Cervical C2 flexed rotated and side bent left  C5 flexed rotated and side bent right Thoracic T3 extended rotated and side bent left with inhaled third rib T9 extended rotated and side bent  right Lumbar L2 flexed rotated and side bent right L4 flexed rotated and side bent right Sacrum left on left     Impression and Recommendations:     This case required medical decision making of moderate complexity.

## 2015-05-29 NOTE — Assessment & Plan Note (Signed)
Decision today to treat with OMT was based on Physical Exam  After verbal consent patient was treated with HVLA, ME, FPR techniques in thoracic, rib,, lumbar areas  Patient tolerated the procedure well with improvement in symptoms  Patient given exercises, stretches and lifestyle modifications  Patient is having significant more difficult to know with the tightness. See if any further imaging will be needed in the future.  Patient will follow up in 3-4 weeks

## 2015-05-29 NOTE — Progress Notes (Signed)
Pre visit review using our clinic review tool, if applicable. No additional management support is needed unless otherwise documented below in the visit note. 

## 2015-05-29 NOTE — Patient Instructions (Signed)
Good to see you  Continue the gabapentin  Take a duexis now and muscle relaxer tonight Ice when you need it.  See me again in 3-4 weeks and GET BACK IN THE GYM

## 2015-05-29 NOTE — Assessment & Plan Note (Signed)
I do believe that this will continue to be multifactorial. Secondary to patient's body habitus. We discussed with patient at great length. Encourage him to continue to try to wear good shoes. We discussed the importance of weight loss and core strengthening. We'll continue on the gabapentin at this time. Does respond well to osteopathic manipulation and we'll have him continuing to see me on a 3-4 week regular interval. Has muscle relaxer for breakthrough pain. Labs have been done and no significant autoimmune diseases found or any signs of inflammation.

## 2015-06-13 ENCOUNTER — Telehealth: Payer: Self-pay | Admitting: Pulmonary Disease

## 2015-06-13 NOTE — Telephone Encounter (Signed)
Download retrieved from [facility]. Spoke with Mel Almond and advised fax was on its way. Nothing further needed.

## 2015-06-16 ENCOUNTER — Other Ambulatory Visit: Payer: Self-pay | Admitting: Family Medicine

## 2015-06-26 ENCOUNTER — Ambulatory Visit (INDEPENDENT_AMBULATORY_CARE_PROVIDER_SITE_OTHER): Payer: 59 | Admitting: Family Medicine

## 2015-06-26 VITALS — BP 110/76 | HR 82 | Wt 348.0 lb

## 2015-06-26 DIAGNOSIS — M9908 Segmental and somatic dysfunction of rib cage: Secondary | ICD-10-CM

## 2015-06-26 DIAGNOSIS — M9903 Segmental and somatic dysfunction of lumbar region: Secondary | ICD-10-CM | POA: Diagnosis not present

## 2015-06-26 DIAGNOSIS — G2589 Other specified extrapyramidal and movement disorders: Secondary | ICD-10-CM

## 2015-06-26 DIAGNOSIS — M9902 Segmental and somatic dysfunction of thoracic region: Secondary | ICD-10-CM | POA: Diagnosis not present

## 2015-06-26 DIAGNOSIS — M999 Biomechanical lesion, unspecified: Secondary | ICD-10-CM

## 2015-06-26 MED ORDER — TRAMADOL HCL 50 MG PO TABS: 50.0000 mg | ORAL_TABLET | Freq: Two times a day (BID) | ORAL | Status: DC | PRN

## 2015-06-26 NOTE — Assessment & Plan Note (Signed)
Continues to have difficulty overall. We discussed icing regimen. Discussed the medications and given up her scheduled for tramadol for breakthrough pain. We discussed which x-rays and do an which was to avoid. Patient also try to increase his gabapentin to see if this will be beneficial. Patient will continue the over-the-counter natural supplementations. We'll see patient back again in 3-4 weeks.

## 2015-06-26 NOTE — Patient Instructions (Signed)
Good to see you  Send me a message in a week after increasing gababpentin to 300mg  at night.  If you like it I will send in a new prescription Will give you a little tramadol for breakthrough pain if need up to 2 times a day  Stop the pravastatin for a week and see if that helps See me again in 4 weeks.

## 2015-06-26 NOTE — Progress Notes (Signed)
[name] [address] [place], [place] [address] Phone: [phone] Subjective:    CC: Back painfollow-up, right shoulder pain follow up  [id number] Colin Bennett is a 35 y.o. male coming in with complaint of back pain.  Having more of an exacerbation. Started on gabapentin recently. States that this is improved Still has some tightness overall special in the right lower back. Not as severe as what it was previously though. Patient did have labs at last exam to rule out any autoimmune diseases. Reviewed and independently by me with no significant findings. No signs of autoimmune diseases.  Patient continues to have back pain. Seems to be having some intermittent shoulder and knee pain as well. Not as severe as what it can be but sometimes gives him trouble. Patient states it is just a dull, throbbing aching pain at all times that can be bad enough that stops him from activities. States then other days he has good days and is able to do everything without any significant pain. No association with food or any specific activities.      Last imaging and patient's lumbar spine were individually visualized by me. 05/31/2014 lumbar spine showed very mild degenerative disc disease but otherwise fairly unremarkable.  Past Medical History  Diagnosis Date  . Kidney stones   . Diabetes mellitus   . Bipolar 1 disorder ([place])   . HTN (hypertension) 05/10/2012  . Preventative health care 05/14/2012  . Renal lithiasis 08/10/2012  . Bipolar disorder, unspecified ([place]) 08/10/2012  . Obesity, unspecified 11/09/2012  . Pain, joint, shoulder, right 04/09/2013  . Diabetes mellitus type 2 in obese ([place]) 01/26/2014  . Diabetes mellitus type 2 in obese ([place]) 06/10/2014  . Nephrolithiasis 06/10/2014   Past Surgical History  Procedure Laterality Date  . Coronary artery bypass graft     Social History  Substance Use Topics  . Smoking status: Never Smoker   . Smokeless tobacco:  Never Used  . Alcohol Use: No   No Known Allergies Family History  Problem Relation Age of Onset  . Hypertension Other   . Diabetes Other      Past medical history, social, surgical and family history all reviewed in electronic medical record.     Review of Systems: No headache, visual changes, nausea, vomiting, diarrhea, constipation, dizziness, abdominal pain, skin rash, fevers, chills, night sweats, weight loss, swollen lymph nodes, body aches, joint swelling, muscle aches, chest pain, shortness of breath, mood changes.   Objective Blood pressure 110/76, pulse 82, weight 348 lb (157.852 kg).  General: No apparent distress alert and oriented x3 mood and affect normal, dressed appropriately. Morbidly obese HEENT: Pupils equal, extraocular movements intact  Respiratory: Patient's speak in full sentences and does not appear short of breath  Cardiovascular: No lower extremity edema, non tender, no erythema  Skin: Warm dry intact with no signs of infection or rash on extremities or on axial skeleton.  Abdomen: Soft nontender  Neuro: Cranial nerves II through XII are intact, neurovascularly intact in all extremities with 2+ DTRs and 2+ pulses.  Lymph: No lymphadenopathy of posterior or anterior cervical chain or axillae bilaterally.  Gait normal with good balance and coordination.  MSK:  Non tender with full range of motion and good stability and symmetric strength and tone of , elbows, wrist, hip, knee and ankles bilaterally.    Back Exam:  Inspection: . Continue stiffness of the kyphosis Motion: Flexion 30 deg, Extension 15 deg, Side Bending to  15 deg bilaterally,  Rotation to 20 deg bilaterally .  Continued tenderness noted SLR laying: Negative  XSLR laying: Negative  Palpable tenderness: Less tender in the upper back but more tender in the lumbar spine paraspinal musculature FABER: negative. Sensory change: Gross sensation intact to all lumbar and sacral dermatomes.  Reflexes:  2+ at both patellar tendons, 2+ at achilles tendons, Babinski's downgoing.  Strength at foot  Seems symmetric bilaterally  Osteopathic findings Cervical C2 flexed rotated and side bent left  C5 flexed rotated and side bent right Thoracic T3 extended rotated and side bent left with inhaled third rib T7 extended rotated and side bent right Lumbar L2 flexed rotated and side bent right L4 flexed rotated and side bent right Sacrum left on left     Impression and Recommendations:     This case required medical decision making of moderate complexity.

## 2015-06-26 NOTE — Assessment & Plan Note (Signed)
Decision today to treat with OMT was based on Physical Exam  After verbal consent patient was treated with HVLA, ME, FPR techniques in thoracic, rib,, lumbar areas  Patient tolerated the procedure well with improvement in symptoms  Patient given exercises, stretches and lifestyle modifications  Patient is having significant more difficult to know with the tightness. See if any further imaging will be needed in the future.  Patient will follow up in 3-4 weeks

## 2015-07-03 ENCOUNTER — Other Ambulatory Visit: Payer: 59

## 2015-07-12 ENCOUNTER — Ambulatory Visit: Payer: Commercial Managed Care - PPO | Admitting: Family Medicine

## 2015-07-15 ENCOUNTER — Telehealth: Payer: Self-pay | Admitting: *Deleted

## 2015-07-15 NOTE — Telephone Encounter (Signed)
If denied, alternatives include Onglyza or Tradjenta. JG//CMA

## 2015-07-15 NOTE — Telephone Encounter (Signed)
PA for Invokana submitted to [employer], awaiting determination. JG//CMA

## 2015-07-16 ENCOUNTER — Other Ambulatory Visit: Payer: Self-pay | Admitting: Family Medicine

## 2015-07-16 NOTE — Telephone Encounter (Signed)
error 

## 2015-07-17 NOTE — Telephone Encounter (Signed)
PA approved effective through 07/14/2016. File ID: [id number]. Approval letter from [facility] sent for scanning. JG//CMA

## 2015-07-22 ENCOUNTER — Encounter: Payer: Self-pay | Admitting: Family Medicine

## 2015-07-22 ENCOUNTER — Ambulatory Visit (INDEPENDENT_AMBULATORY_CARE_PROVIDER_SITE_OTHER): Payer: 59 | Admitting: Family Medicine

## 2015-07-22 VITALS — BP 108/72 | HR 96 | Temp 98.4°F | Ht 72.0 in | Wt 355.2 lb

## 2015-07-22 DIAGNOSIS — E669 Obesity, unspecified: Secondary | ICD-10-CM | POA: Diagnosis not present

## 2015-07-22 DIAGNOSIS — E119 Type 2 diabetes mellitus without complications: Secondary | ICD-10-CM | POA: Diagnosis not present

## 2015-07-22 DIAGNOSIS — E785 Hyperlipidemia, unspecified: Secondary | ICD-10-CM | POA: Diagnosis not present

## 2015-07-22 DIAGNOSIS — I10 Essential (primary) hypertension: Secondary | ICD-10-CM | POA: Diagnosis not present

## 2015-07-22 DIAGNOSIS — E1169 Type 2 diabetes mellitus with other specified complication: Secondary | ICD-10-CM

## 2015-07-22 LAB — COMPREHENSIVE METABOLIC PANEL
ALBUMIN: 3.9 g/dL (ref 3.6–5.1)
ALK PHOS: 67 U/L (ref 40–115)
ALT: 21 U/L (ref 9–46)
AST: 13 U/L (ref 10–40)
BILIRUBIN TOTAL: 0.4 mg/dL (ref 0.2–1.2)
BUN: 15 mg/dL (ref 7–25)
CALCIUM: 9.5 mg/dL (ref 8.6–10.3)
CO2: 24 mmol/L (ref 20–31)
Chloride: 104 mmol/L (ref 98–110)
Creat: 0.65 mg/dL (ref 0.60–1.35)
Glucose, Bld: 166 mg/dL — ABNORMAL HIGH (ref 65–99)
Potassium: 4.1 mmol/L (ref 3.5–5.3)
Sodium: 139 mmol/L (ref 135–146)
Total Protein: 6.5 g/dL (ref 6.1–8.1)

## 2015-07-22 LAB — CBC
HEMATOCRIT: 47.5 % (ref 38.5–50.0)
Hemoglobin: 16.2 g/dL (ref 13.2–17.1)
MCH: 29.3 pg (ref 27.0–33.0)
MCHC: 34.1 g/dL (ref 32.0–36.0)
MCV: 85.9 fL (ref 80.0–100.0)
MPV: 9.4 fL (ref 7.5–12.5)
Platelets: 223 10*3/uL (ref 140–400)
RBC: 5.53 MIL/uL (ref 4.20–5.80)
RDW: 13.7 % (ref 11.0–15.0)
WBC: 10.8 10*3/uL (ref 3.8–10.8)

## 2015-07-22 LAB — LIPID PANEL
CHOL/HDL RATIO: 6 ratio — AB (ref <=5.0)
CHOLESTEROL: 187 mg/dL (ref 125–200)
HDL: 31 mg/dL — ABNORMAL LOW (ref >=40)
LDL Cholesterol: 96 mg/dL (ref <130)
TRIGLYCERIDES: 300 mg/dL — AB (ref <150)
VLDL: 60 mg/dL — ABNORMAL HIGH (ref <30)

## 2015-07-22 LAB — TSH: TSH: 1.17 mIU/L (ref 0.40–4.50)

## 2015-07-22 LAB — HEMOGLOBIN A1C
Hgb A1c MFr Bld: 7.2 % — ABNORMAL HIGH (ref <5.7)
MEAN PLASMA GLUCOSE: 160 mg/dL

## 2015-07-22 MED ORDER — CANAGLIFLOZIN 300 MG PO TABS: 300.0000 mg | ORAL_TABLET | Freq: Every day | ORAL | Status: DC

## 2015-07-22 MED ORDER — LAMOTRIGINE 100 MG PO TABS: 100.0000 mg | ORAL_TABLET | Freq: Every day | ORAL | Status: DC

## 2015-07-22 MED ORDER — METFORMIN HCL 500 MG PO TABS
500.0000 mg | ORAL_TABLET | Freq: Three times a day (TID) | ORAL | Status: DC
Start: 2015-07-22 — End: 2016-03-28

## 2015-07-22 MED ORDER — SITAGLIPTIN PHOSPHATE 100 MG PO TABS: 100.0000 mg | ORAL_TABLET | Freq: Every day | ORAL | Status: DC

## 2015-07-22 MED ORDER — VENLAFAXINE HCL ER 150 MG PO CP24: 150.0000 mg | ORAL_CAPSULE | Freq: Every day | ORAL | Status: DC

## 2015-07-22 NOTE — Assessment & Plan Note (Signed)
Encouraged heart healthy diet, increase exercise, avoid trans fats, consider a krill oil cap daily. Stopped statin due to myalgias but only slight improvement

## 2015-07-22 NOTE — Patient Instructions (Signed)

## 2015-07-22 NOTE — Assessment & Plan Note (Signed)
hgba1c acceptable, minimize simple carbs. Increase exercise as tolerated. Continue current meds 

## 2015-07-22 NOTE — Assessment & Plan Note (Signed)
Well controlled, no changes to meds. Encouraged heart healthy diet such as the DASH diet and exercise as tolerated.  °

## 2015-07-24 ENCOUNTER — Ambulatory Visit (INDEPENDENT_AMBULATORY_CARE_PROVIDER_SITE_OTHER): Payer: Self-pay | Admitting: Family Medicine

## 2015-07-24 ENCOUNTER — Encounter: Payer: Self-pay | Admitting: Family Medicine

## 2015-07-24 VITALS — BP 128/84 | HR 108 | Ht 72.0 in | Wt 350.0 lb

## 2015-07-24 DIAGNOSIS — M25361 Other instability, right knee: Secondary | ICD-10-CM

## 2015-07-24 MED ORDER — PRAVASTATIN SODIUM 20 MG PO TABS: 20.0000 mg | ORAL_TABLET | Freq: Every day | ORAL | Status: DC

## 2015-07-24 NOTE — Assessment & Plan Note (Addendum)
Patient does have more of an instability of the knee. This likely is more of an instability and likely exacerbation of an underlying problem. Patient describes the mechanism of coming off a trailer that could be consistent with a pivot shift injury. Icing Regimen and home exercises. Discussed compression. We did discuss possible injection the patient declined. Patient will likely see if he can see another provider if he is going to have this is more of a Workmen's Comp. Due to patient's age we did discuss at least a custom brace which patient also declined. Patient given anti-inflammatories. Has gabapentin and does have tramadol for breakthrough pain. Depending on what happens with patient's insurance with her follow-up with [place] or go to another provider for further intervention likely imaging.

## 2015-07-24 NOTE — Patient Instructions (Signed)
Good to see you  Ice 20 minutes 2 times daily. Usually after activity and before bed. pennsaid pinkie amount topically 2 times daily as needed.  Can take Duexis 3 times daily as needed Avoid twisting.   With a lot of walking wear brace.  I hope it will calm down in 2 weeks but would continue with the workman's comp in case it does not and new imaging is needed.  I am here if you have questions
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
## 2015-07-24 NOTE — Progress Notes (Signed)
Pre visit review using our clinic review tool, if applicable. No additional management support is needed unless otherwise documented below in the visit note. 

## 2015-07-25 ENCOUNTER — Telehealth: Payer: Self-pay | Admitting: Family Medicine

## 2015-07-25 NOTE — Telephone Encounter (Signed)
°  Relationship to patient: Self  Can be reached: [phone]   Reason for call: Patient called stating that he lost his job on Friday and can no longer afford his medicines. Wants advise on where he can go to get assistance or samples.

## 2015-07-26 NOTE — Telephone Encounter (Signed)
Notify most of his meds are generic and can be purchased for a good price at [employer] and/or [employer] they both have good cash prices. Start there, the only ones that might be too much but not sure are Lamictal and Januvia, after he checks prices we can change meds if need be

## 2015-07-26 NOTE — Telephone Encounter (Signed)
Called the patient informed him of PCP instructions on medications.  He agreed to do so.
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
## 2015-07-28 NOTE — Assessment & Plan Note (Signed)
Encouraged DASH diet, decrease po intake and increase exercise as tolerated. Needs 7-8 hours of sleep nightly. Avoid trans fats, eat small, frequent meals every 4-5 hours with lean proteins, complex carbs and healthy fats. Minimize simple carbs 

## 2015-08-28 ENCOUNTER — Other Ambulatory Visit (HOSPITAL_COMMUNITY): Payer: Self-pay | Admitting: Orthopedic Surgery

## 2015-08-28 DIAGNOSIS — M25561 Pain in right knee: Secondary | ICD-10-CM

## 2015-08-29 ENCOUNTER — Other Ambulatory Visit (HOSPITAL_COMMUNITY): Payer: Self-pay | Admitting: Orthopedic Surgery

## 2015-08-29 DIAGNOSIS — M25561 Pain in right knee: Secondary | ICD-10-CM

## 2015-08-31 ENCOUNTER — Ambulatory Visit (HOSPITAL_COMMUNITY): Payer: Self-pay

## 2015-09-03 ENCOUNTER — Ambulatory Visit (HOSPITAL_COMMUNITY)
Admission: RE | Admit: 2015-09-03 | Discharge: 2015-09-03 | Disposition: A | Discharge status: Home / Self Care | Payer: Commercial Managed Care - PPO | Source: Ambulatory Visit | Attending: Orthopedic Surgery | Admitting: Orthopedic Surgery

## 2015-09-03 DIAGNOSIS — M25561 Pain in right knee: Secondary | ICD-10-CM | POA: Diagnosis not present

## 2015-09-03 DIAGNOSIS — M238X1 Other internal derangements of right knee: Secondary | ICD-10-CM | POA: Diagnosis not present

## 2015-09-09 ENCOUNTER — Other Ambulatory Visit: Payer: Self-pay | Admitting: Family Medicine

## 2015-09-10 NOTE — Telephone Encounter (Signed)
Refill done.  

## 2015-10-18 DIAGNOSIS — S2241XA Multiple fractures of ribs, right side, initial encounter for closed fracture: Secondary | ICD-10-CM

## 2015-10-18 HISTORY — DX: Multiple fractures of ribs, right side, initial encounter for closed fracture: S22.41XA

## 2015-10-22 IMAGING — DX DG LUMBAR SPINE COMPLETE 4+V
5 series · 5 of 5 positions shown · non-contrast
Comparison: None.

CLINICAL DATA: Low back pain for years. No known injury. Initial
evaluation.

EXAM:
LUMBAR SPINE - COMPLETE 4+ VIEW

[l-spine ap]
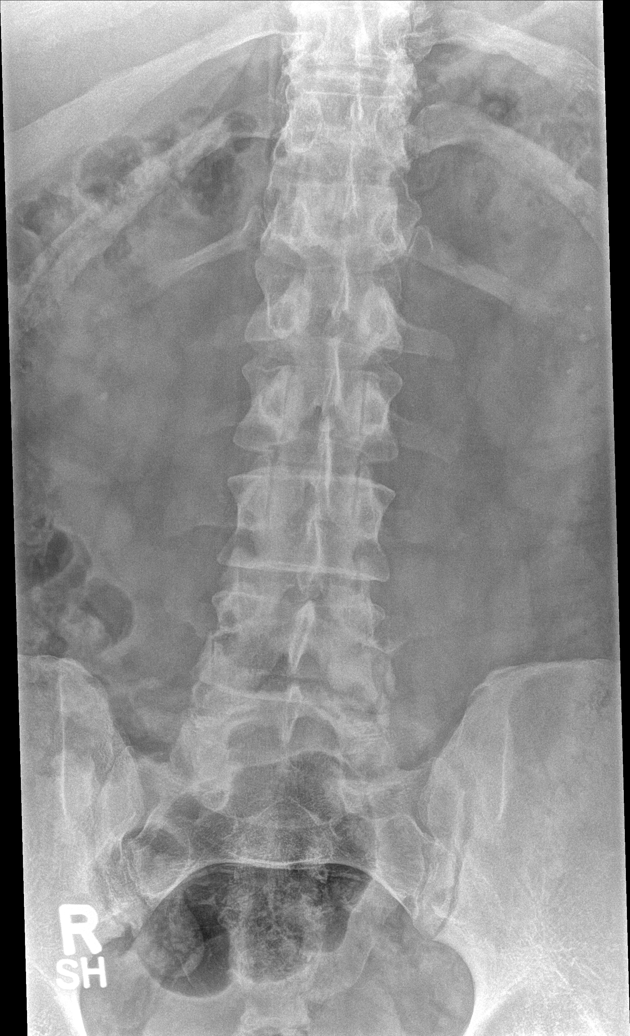

[l-spine obl (1 of 2)]
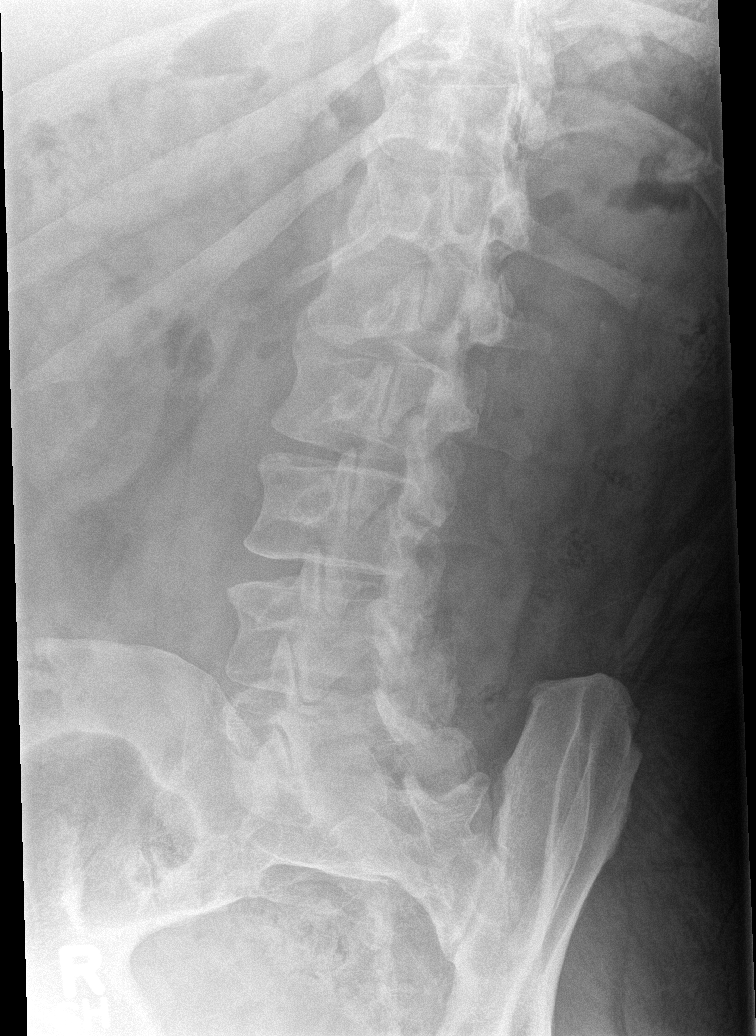

[l-spine obl (2 of 2)]
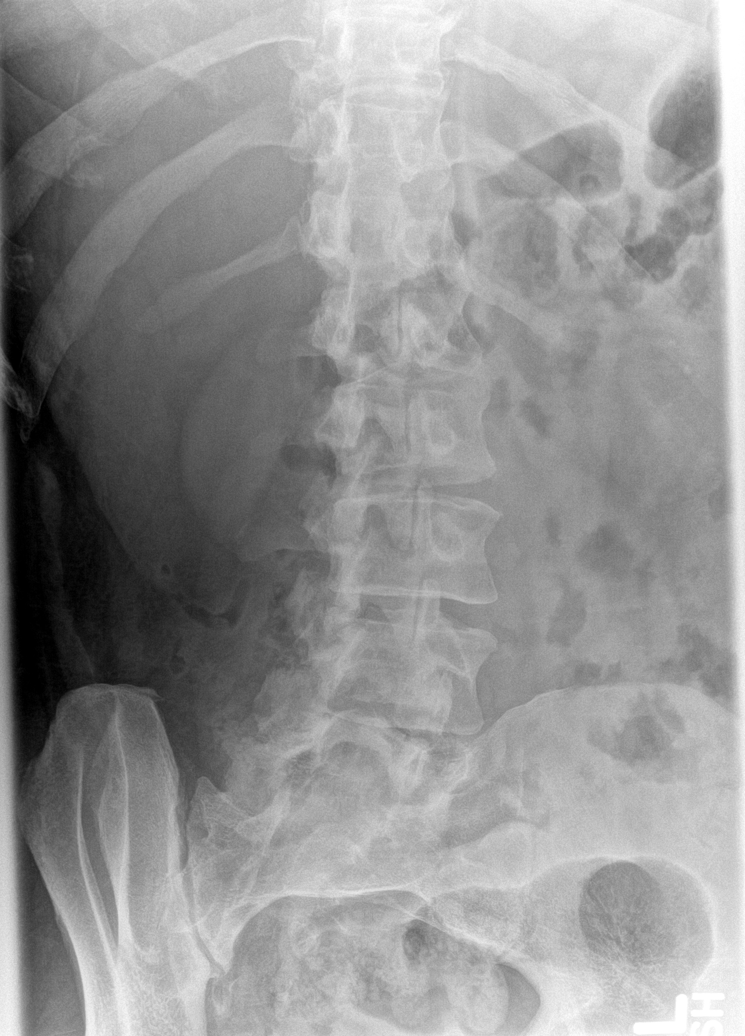

[l-spine lat]
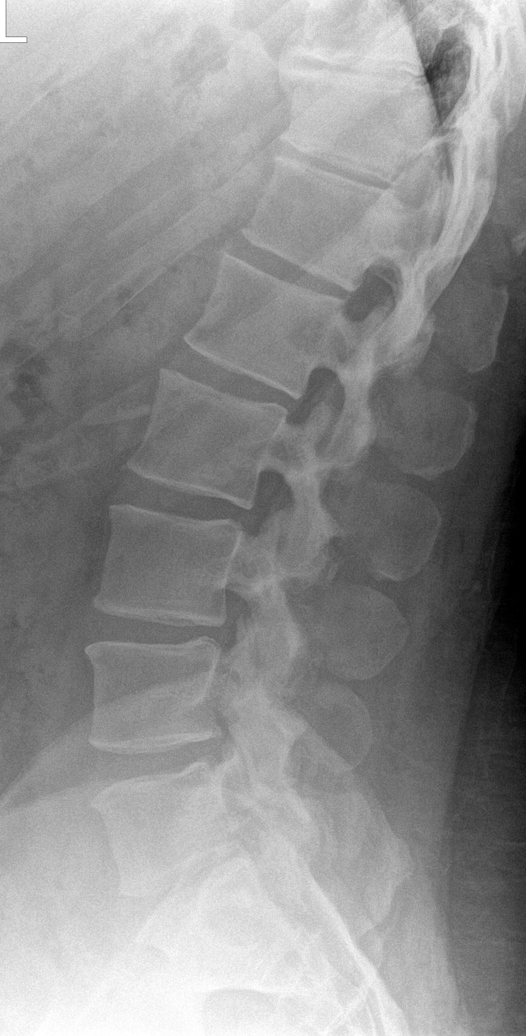

[l-spine spot]
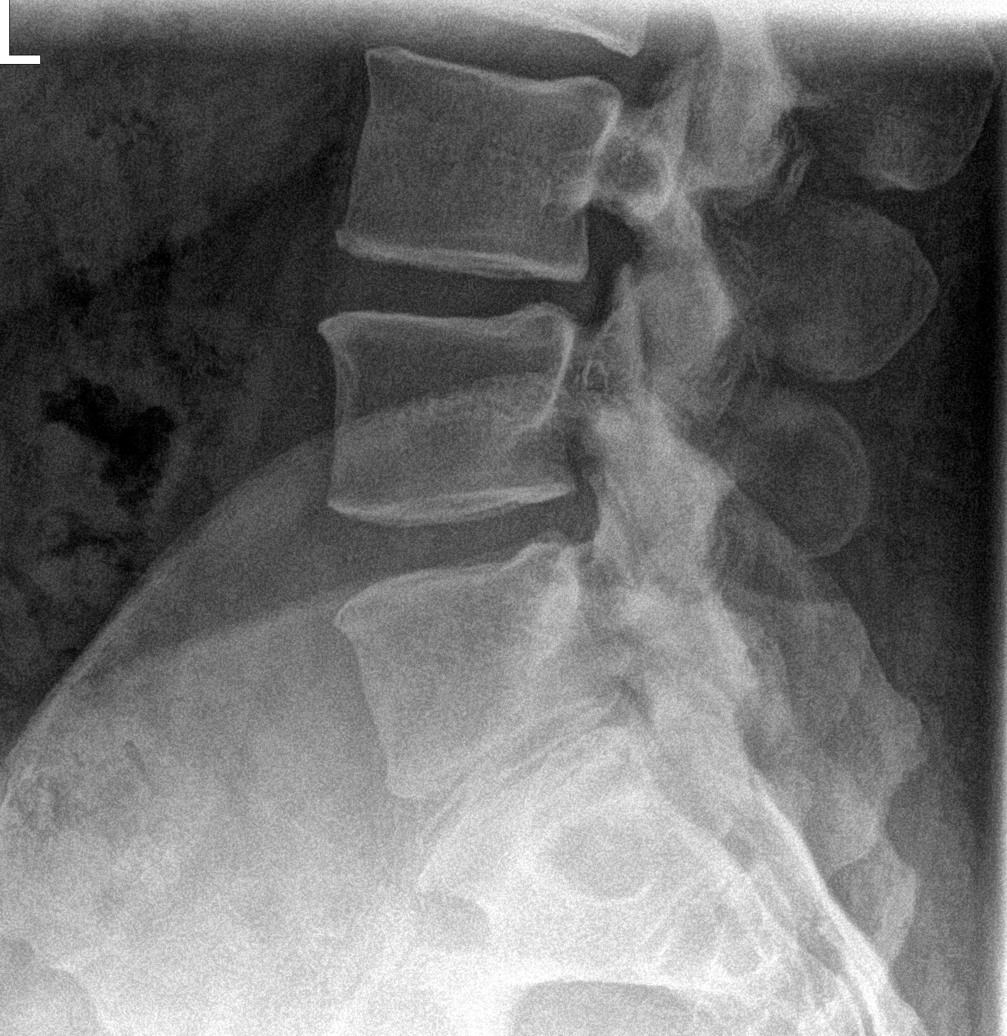

[5 of 5 positions shown; findings below may reference images not displayed]

FINDINGS: Mild diffuse degenerative change. No acute bony abnormality
identified. Normal alignment. Normal mineralization. Bilateral
nephrolithiasis.
IMPRESSION: 1. Mild diffuse degenerative change.  No acute abnormality.

2. Bilateral nephrolithiasis.

## 2015-10-23 DIAGNOSIS — S065XAA Traumatic subdural hemorrhage with loss of consciousness status unknown, initial encounter: Secondary | ICD-10-CM

## 2015-10-23 DIAGNOSIS — S065X9A Traumatic subdural hemorrhage with loss of consciousness of unspecified duration, initial encounter: Secondary | ICD-10-CM

## 2015-10-23 HISTORY — DX: Traumatic subdural hemorrhage with loss of consciousness status unknown, initial encounter: S06.5XAA

## 2015-10-23 HISTORY — DX: Traumatic subdural hemorrhage with loss of consciousness of unspecified duration, initial encounter: S06.5X9A

## 2015-10-24 DIAGNOSIS — F919 Conduct disorder, unspecified: Secondary | ICD-10-CM | POA: Insufficient documentation

## 2015-10-24 DIAGNOSIS — I10 Essential (primary) hypertension: Secondary | ICD-10-CM | POA: Insufficient documentation

## 2015-10-24 DIAGNOSIS — S2241XA Multiple fractures of ribs, right side, initial encounter for closed fracture: Secondary | ICD-10-CM

## 2015-10-24 DIAGNOSIS — F69 Unspecified disorder of adult personality and behavior: Secondary | ICD-10-CM

## 2015-10-24 HISTORY — DX: Unspecified disorder of adult personality and behavior: F69

## 2015-10-24 HISTORY — DX: Multiple fractures of ribs, right side, initial encounter for closed fracture: S22.41XA

## 2015-10-24 HISTORY — DX: Essential (primary) hypertension: I10

## 2015-10-25 DIAGNOSIS — S4351XA Sprain of right acromioclavicular joint, initial encounter: Secondary | ICD-10-CM | POA: Insufficient documentation

## 2015-10-25 HISTORY — DX: Sprain of right acromioclavicular joint, initial encounter: S43.51XA

## 2015-10-28 DIAGNOSIS — Z6841 Body Mass Index (BMI) 40.0 and over, adult: Secondary | ICD-10-CM

## 2015-10-28 HISTORY — DX: Morbid (severe) obesity due to excess calories: E66.01

## 2015-11-01 ENCOUNTER — Other Ambulatory Visit: Payer: Self-pay | Admitting: Family Medicine

## 2015-11-04 ENCOUNTER — Telehealth: Payer: Self-pay | Admitting: Family Medicine

## 2015-11-04 NOTE — Telephone Encounter (Signed)
Refill done.  

## 2015-11-04 NOTE — Telephone Encounter (Signed)
Called and gave verbal order.
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
## 2015-11-07 ENCOUNTER — Ambulatory Visit (INDEPENDENT_AMBULATORY_CARE_PROVIDER_SITE_OTHER): Payer: Commercial Managed Care - PPO | Admitting: Medical

## 2015-11-07 ENCOUNTER — Encounter: Payer: Self-pay | Admitting: Medical

## 2015-11-07 ENCOUNTER — Ambulatory Visit (HOSPITAL_BASED_OUTPATIENT_CLINIC_OR_DEPARTMENT_OTHER)
Admission: RE | Admit: 2015-11-07 | Discharge: 2015-11-07 | Disposition: A | Discharge status: Home / Self Care | Payer: Commercial Managed Care - PPO | Source: Ambulatory Visit | Attending: Medical | Admitting: Medical

## 2015-11-07 VITALS — BP 120/80 | HR 86 | Ht 72.0 in

## 2015-11-07 DIAGNOSIS — M25511 Pain in right shoulder: Secondary | ICD-10-CM

## 2015-11-07 DIAGNOSIS — M7731 Calcaneal spur, right foot: Secondary | ICD-10-CM | POA: Diagnosis not present

## 2015-11-07 DIAGNOSIS — S2241XD Multiple fractures of ribs, right side, subsequent encounter for fracture with routine healing: Secondary | ICD-10-CM | POA: Diagnosis not present

## 2015-11-07 DIAGNOSIS — S46819D Strain of other muscles, fascia and tendons at shoulder and upper arm level, unspecified arm, subsequent encounter: Secondary | ICD-10-CM

## 2015-11-07 DIAGNOSIS — T148XXA Other injury of unspecified body region, initial encounter: Secondary | ICD-10-CM

## 2015-11-07 DIAGNOSIS — M898X6 Other specified disorders of bone, lower leg: Secondary | ICD-10-CM | POA: Insufficient documentation

## 2015-11-07 DIAGNOSIS — L089 Local infection of the skin and subcutaneous tissue, unspecified: Secondary | ICD-10-CM

## 2015-11-07 DIAGNOSIS — S46819A Strain of other muscles, fascia and tendons at shoulder and upper arm level, unspecified arm, initial encounter: Secondary | ICD-10-CM

## 2015-11-07 DIAGNOSIS — S2241XA Multiple fractures of ribs, right side, initial encounter for closed fracture: Secondary | ICD-10-CM

## 2015-11-07 DIAGNOSIS — Z8679 Personal history of other diseases of the circulatory system: Secondary | ICD-10-CM | POA: Diagnosis not present

## 2015-11-07 DIAGNOSIS — S32401D Unspecified fracture of right acetabulum, subsequent encounter for fracture with routine healing: Secondary | ICD-10-CM

## 2015-11-07 DIAGNOSIS — T148XXD Other injury of unspecified body region, subsequent encounter: Secondary | ICD-10-CM

## 2015-11-07 MED ORDER — DOXYCYCLINE HYCLATE 100 MG PO TABS
100.0000 mg | ORAL_TABLET | Freq: Two times a day (BID) | ORAL | 0 refills | Status: DC
Start: 2015-11-07 — End: 2016-07-30
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
## 2015-11-08 ENCOUNTER — Telehealth: Payer: Self-pay | Admitting: Family Medicine

## 2015-11-08 NOTE — Telephone Encounter (Signed)
Called to get verbal orders for a pt.    Legrand Como Mowers PT [phone]    He would like a call back .

## 2015-11-09 LAB — WOUND CULTURE
Gram Stain: NONE SEEN
Gram Stain: NONE SEEN
Gram Stain: NONE SEEN

## 2015-11-09 MED ORDER — AMOXICILLIN-POT CLAVULANATE 875-125 MG PO TABS: 1.0000 | ORAL_TABLET | Freq: Two times a day (BID) | ORAL | 0 refills | Status: DC

## 2015-11-09 NOTE — Telephone Encounter (Signed)
rx augmentin started if needed. But stop doxy if need to start augmentin.

## 2015-11-11 DIAGNOSIS — M795 Residual foreign body in soft tissue: Secondary | ICD-10-CM

## 2015-11-11 NOTE — Progress Notes (Signed)
Pt has seen results on MyChart and message also sent for patient to call back if any questions.
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
## 2015-11-13 ENCOUNTER — Encounter: Payer: Self-pay | Admitting: Family Medicine

## 2015-11-13 ENCOUNTER — Ambulatory Visit: Payer: Self-pay

## 2015-11-13 ENCOUNTER — Ambulatory Visit (INDEPENDENT_AMBULATORY_CARE_PROVIDER_SITE_OTHER): Payer: Commercial Managed Care - PPO | Admitting: Family Medicine

## 2015-11-13 VITALS — BP 118/72 | HR 91

## 2015-11-13 DIAGNOSIS — M25551 Pain in right hip: Secondary | ICD-10-CM | POA: Diagnosis not present

## 2015-11-13 DIAGNOSIS — M25511 Pain in right shoulder: Secondary | ICD-10-CM | POA: Diagnosis not present

## 2015-11-13 DIAGNOSIS — S32424A Nondisplaced fracture of posterior wall of right acetabulum, initial encounter for closed fracture: Secondary | ICD-10-CM

## 2015-11-13 DIAGNOSIS — S32401A Unspecified fracture of right acetabulum, initial encounter for closed fracture: Secondary | ICD-10-CM

## 2015-11-13 DIAGNOSIS — S4991XA Unspecified injury of right shoulder and upper arm, initial encounter: Secondary | ICD-10-CM

## 2015-11-13 HISTORY — DX: Unspecified fracture of right acetabulum, initial encounter for closed fracture: S32.401A

## 2015-11-13 MED ORDER — KETOROLAC TROMETHAMINE 60 MG/2ML IM SOLN
60.0000 mg | Freq: Once | INTRAMUSCULAR | Status: AC
Start: 2015-11-13 — End: 2015-11-13
  Administered 2015-11-13: 60 mg via INTRAMUSCULAR

## 2015-11-13 MED ORDER — VITAMIN D (ERGOCALCIFEROL) 1.25 MG (50000 UNIT) PO CAPS: 50000.0000 [IU] | ORAL_CAPSULE | ORAL | 0 refills | Status: DC

## 2015-11-13 MED ORDER — ALENDRONATE SODIUM 70 MG PO TABS: 70.0000 mg | ORAL_TABLET | ORAL | 11 refills | Status: AC

## 2015-11-13 NOTE — Patient Instructions (Signed)
It is really good to see you! Once weekly vitamin D for 12 weeks Fosamax weekly for next 8 weeks.  Avoid significant standing on the leg still for another 2 weeks.  We will get xray before next exam  See me again in 2 weeks Do not be afraid to take some pain meds.

## 2015-11-13 NOTE — Assessment & Plan Note (Signed)
Patient given injection today and tolerated the procedure well.

## 2015-11-13 NOTE — Assessment & Plan Note (Signed)
Patient does have a posterior transverse acetabular fracture that had been seen on prior imaging and his admissions initially one month ago. At this point I do not feel that x-rays would change management at this time and I do feel that patient would be nonweightbearing for 6-8 weeks in total. We discussed patient coming back in 2 weeks for further evaluation at that time we'll get x-rays. To help with healing we will have patient start on vitamin D as well as Fosamax. We will see how patient response to this. Follow-up again in 2 weeks.

## 2015-11-14 ENCOUNTER — Ambulatory Visit (HOSPITAL_BASED_OUTPATIENT_CLINIC_OR_DEPARTMENT_OTHER)
Admission: RE | Admit: 2015-11-14 | Discharge: 2015-11-14 | Disposition: A | Discharge status: Home / Self Care | Payer: Commercial Managed Care - PPO | Source: Ambulatory Visit | Attending: Medical | Admitting: Medical

## 2015-11-14 ENCOUNTER — Encounter: Payer: Self-pay | Admitting: Medical

## 2015-11-14 ENCOUNTER — Telehealth: Payer: Self-pay | Admitting: Emergency Medicine

## 2015-11-14 ENCOUNTER — Encounter: Payer: Self-pay | Admitting: Family Medicine

## 2015-11-14 ENCOUNTER — Ambulatory Visit (INDEPENDENT_AMBULATORY_CARE_PROVIDER_SITE_OTHER): Payer: Commercial Managed Care - PPO | Admitting: Medical

## 2015-11-14 VITALS — BP 116/78 | HR 87 | Ht 72.0 in

## 2015-11-14 DIAGNOSIS — M79644 Pain in right finger(s): Secondary | ICD-10-CM | POA: Diagnosis not present

## 2015-11-14 DIAGNOSIS — T148XXD Other injury of unspecified body region, subsequent encounter: Secondary | ICD-10-CM | POA: Diagnosis not present

## 2015-11-14 DIAGNOSIS — M795 Residual foreign body in soft tissue: Secondary | ICD-10-CM | POA: Insufficient documentation

## 2015-11-14 NOTE — Patient Instructions (Addendum)
For your rt pretibial wound that is slowly healing, I want you to apply warm salt water compresses twice daily and this may help clear scab debri. Start augmentin since ecoli found and sensitive to augmentin. I will go ahead and make wound clinic referral if by 7-10 days the area is still not closing up/healing.  For rt thumb will get xray of thumb to see if fb might be able to be seen. Could apply warm compresses to this area as well. If glass seen and more tender consider referral to hand specialist.  For hip/pelvis pain continue the oxycodone. I think as your fracture acetabulum heals and other area improve could in future give lower potent pain medication such as norco.  Follow up with specialist on your fracture and rt shoulder pain.  Follow up here in 3 weeks or as needed

## 2015-11-14 NOTE — Progress Notes (Signed)
Pre visit review using our clinic review tool, if applicable. No additional management support is needed unless otherwise documented below in the visit note. 
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
## 2015-11-14 NOTE — Telephone Encounter (Signed)
lmovm for Ronalee Belts with [facility] to return phone call.

## 2015-11-14 NOTE — Telephone Encounter (Signed)
Will you take that appointment. On 11-29-2015.

## 2015-11-14 NOTE — Telephone Encounter (Signed)
Colin Bennett with [facility] called. Nursing plans on discharging pt on the 11/1. He wants to know if he needs to put therapy on hold or discharge and start again later. He has some questions about this and asked that you give him a call back. Please advise thanks.

## 2015-11-15 ENCOUNTER — Telehealth: Payer: Self-pay | Admitting: Family Medicine

## 2015-11-15 ENCOUNTER — Telehealth: Payer: Self-pay | Admitting: Emergency Medicine

## 2015-11-15 NOTE — Telephone Encounter (Signed)
Pt called and asked that you give him a call back. He asked if there is a way he can get some pain patches. Please advise thanks.

## 2015-11-15 NOTE — Telephone Encounter (Signed)
°  Relationship to patient:Self Can be reached: [phone]   Reason for call: FYI: [facility] called to inform provider that patient had a scheduled visit this morning but was not at home when they arrived. States they will try again next week.

## 2015-11-15 NOTE — Telephone Encounter (Signed)
Ronalee Belts with [facility] called to let us know that they are discharging patient from OT. Patient went to see an orthopedic doctor and he is thought to have a fractured scapula. [place]: [phone]

## 2015-11-19 ENCOUNTER — Encounter: Payer: Self-pay | Admitting: Emergency Medicine

## 2015-11-19 MED ORDER — LIDOCAINE 5 % EX PTCH: 1.0000 | MEDICATED_PATCH | CUTANEOUS | 1 refills | Status: AC

## 2015-11-19 NOTE — Telephone Encounter (Signed)
Pt made aware rx was sent into pharmacy.

## 2015-11-19 NOTE — Telephone Encounter (Signed)
Sent in RX and will see
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
## 2015-11-27 ENCOUNTER — Encounter: Payer: Self-pay | Admitting: Family Medicine

## 2015-11-27 ENCOUNTER — Other Ambulatory Visit: Payer: Self-pay | Admitting: Family Medicine

## 2015-11-27 ENCOUNTER — Ambulatory Visit (INDEPENDENT_AMBULATORY_CARE_PROVIDER_SITE_OTHER): Payer: Commercial Managed Care - PPO | Admitting: Family Medicine

## 2015-11-27 ENCOUNTER — Ambulatory Visit (INDEPENDENT_AMBULATORY_CARE_PROVIDER_SITE_OTHER)
Admission: RE | Admit: 2015-11-27 | Discharge: 2015-11-27 | Disposition: A | Discharge status: Home / Self Care | Payer: Commercial Managed Care - PPO | Source: Ambulatory Visit | Attending: Family Medicine | Admitting: Family Medicine

## 2015-11-27 VITALS — BP 120/84 | HR 93 | Ht 72.0 in

## 2015-11-27 DIAGNOSIS — M25551 Pain in right hip: Secondary | ICD-10-CM

## 2015-11-27 DIAGNOSIS — S32424A Nondisplaced fracture of posterior wall of right acetabulum, initial encounter for closed fracture: Secondary | ICD-10-CM | POA: Diagnosis not present

## 2015-11-27 DIAGNOSIS — S72001G Fracture of unspecified part of neck of right femur, subsequent encounter for closed fracture with delayed healing: Secondary | ICD-10-CM

## 2015-11-27 DIAGNOSIS — S32401A Unspecified fracture of right acetabulum, initial encounter for closed fracture: Secondary | ICD-10-CM | POA: Insufficient documentation

## 2015-11-27 HISTORY — DX: Unspecified fracture of right acetabulum, initial encounter for closed fracture: S32.401A

## 2015-11-27 MED ORDER — OXYCODONE-ACETAMINOPHEN 10-325 MG PO TABS: 1.0000 | ORAL_TABLET | Freq: Four times a day (QID) | ORAL | 0 refills | Status: DC | PRN

## 2015-11-27 NOTE — Patient Instructions (Addendum)
Good to see you  I am sorry for the bad news  We will get a CT scan of the hip and the pelvis.  Lyrica 75-150mg  at night Stop the gabapentin  Gave you percocet 10/325mg .  This is a big dose so be careful.  We will see what the CT shows and if it shows what I think then we will get you in with a surgeon.  We will be in touch

## 2015-11-27 NOTE — Assessment & Plan Note (Signed)
Patient has what appears to be early avascular necrosis. We are going to get a CT scan for further evaluation of the bone as well as the healing of the acetabular fracture. We show good callus formation of the acetabular fracture I do believe the patient is in need of a hip replacement. Patient will be sent to an orthopedic surgeon after we can see this further. Patient will come back and see me again in 3 weeks from the pain medications but I feel that patient will be better suited seen in orthopedic surgeon for replacement.

## 2015-11-29 ENCOUNTER — Encounter (HOSPITAL_BASED_OUTPATIENT_CLINIC_OR_DEPARTMENT_OTHER): Payer: Commercial Managed Care - PPO | Attending: Internal Medicine

## 2015-11-29 DIAGNOSIS — I10 Essential (primary) hypertension: Secondary | ICD-10-CM | POA: Insufficient documentation

## 2015-11-29 DIAGNOSIS — E11622 Type 2 diabetes mellitus with other skin ulcer: Secondary | ICD-10-CM | POA: Insufficient documentation

## 2015-11-29 DIAGNOSIS — S81811D Laceration without foreign body, right lower leg, subsequent encounter: Secondary | ICD-10-CM | POA: Diagnosis not present

## 2015-11-29 DIAGNOSIS — F319 Bipolar disorder, unspecified: Secondary | ICD-10-CM | POA: Diagnosis not present

## 2015-11-29 DIAGNOSIS — Z7984 Long term (current) use of oral hypoglycemic drugs: Secondary | ICD-10-CM | POA: Diagnosis not present

## 2015-11-29 DIAGNOSIS — X58XXXD Exposure to other specified factors, subsequent encounter: Secondary | ICD-10-CM | POA: Insufficient documentation

## 2015-11-29 DIAGNOSIS — L97211 Non-pressure chronic ulcer of right calf limited to breakdown of skin: Secondary | ICD-10-CM | POA: Diagnosis not present

## 2015-12-03 ENCOUNTER — Ambulatory Visit
Admission: RE | Admit: 2015-12-03 | Discharge: 2015-12-03 | Disposition: A | Discharge status: Home / Self Care | Payer: Commercial Managed Care - PPO | Source: Ambulatory Visit | Attending: Family Medicine | Admitting: Family Medicine

## 2015-12-03 ENCOUNTER — Telehealth: Payer: Self-pay | Admitting: Emergency Medicine

## 2015-12-03 DIAGNOSIS — M25551 Pain in right hip: Secondary | ICD-10-CM

## 2015-12-03 NOTE — Telephone Encounter (Signed)
Pt called and wanted to let you know he got his CT scan done. Thanks.

## 2015-12-04 ENCOUNTER — Other Ambulatory Visit: Payer: Self-pay | Admitting: *Deleted

## 2015-12-04 DIAGNOSIS — M1611 Unilateral primary osteoarthritis, right hip: Secondary | ICD-10-CM

## 2015-12-04 DIAGNOSIS — S32401A Unspecified fracture of right acetabulum, initial encounter for closed fracture: Secondary | ICD-10-CM

## 2015-12-04 NOTE — Telephone Encounter (Signed)
Sent message in my chart and we have referred him for surgical intervention.

## 2015-12-05 ENCOUNTER — Encounter: Payer: Self-pay | Admitting: Family Medicine

## 2015-12-05 ENCOUNTER — Ambulatory Visit: Payer: Commercial Managed Care - PPO | Admitting: Medical

## 2015-12-05 MED ORDER — PREGABALIN 100 MG PO CAPS: 100.0000 mg | ORAL_CAPSULE | Freq: Two times a day (BID) | ORAL | 2 refills | Status: DC

## 2015-12-06 DIAGNOSIS — E11622 Type 2 diabetes mellitus with other skin ulcer: Secondary | ICD-10-CM | POA: Diagnosis not present

## 2015-12-16 DIAGNOSIS — E11622 Type 2 diabetes mellitus with other skin ulcer: Secondary | ICD-10-CM | POA: Diagnosis not present

## 2015-12-18 ENCOUNTER — Encounter (INDEPENDENT_AMBULATORY_CARE_PROVIDER_SITE_OTHER): Payer: Self-pay | Admitting: Physician Assistant

## 2015-12-18 ENCOUNTER — Ambulatory Visit (INDEPENDENT_AMBULATORY_CARE_PROVIDER_SITE_OTHER): Payer: Commercial Managed Care - PPO | Admitting: Physician Assistant

## 2015-12-18 VITALS — Ht 73.5 in | Wt 341.0 lb

## 2015-12-18 DIAGNOSIS — S32421A Displaced fracture of posterior wall of right acetabulum, initial encounter for closed fracture: Secondary | ICD-10-CM | POA: Diagnosis not present
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
## 2015-12-19 ENCOUNTER — Encounter: Payer: Self-pay | Admitting: Family Medicine

## 2015-12-23 ENCOUNTER — Encounter (HOSPITAL_BASED_OUTPATIENT_CLINIC_OR_DEPARTMENT_OTHER): Payer: Commercial Managed Care - PPO | Attending: Internal Medicine

## 2015-12-23 DIAGNOSIS — I10 Essential (primary) hypertension: Secondary | ICD-10-CM | POA: Diagnosis not present

## 2015-12-23 DIAGNOSIS — E11622 Type 2 diabetes mellitus with other skin ulcer: Secondary | ICD-10-CM | POA: Insufficient documentation

## 2015-12-23 DIAGNOSIS — L97211 Non-pressure chronic ulcer of right calf limited to breakdown of skin: Secondary | ICD-10-CM | POA: Insufficient documentation

## 2015-12-24 ENCOUNTER — Encounter: Payer: Self-pay | Admitting: Family Medicine

## 2015-12-30 DIAGNOSIS — E11622 Type 2 diabetes mellitus with other skin ulcer: Secondary | ICD-10-CM | POA: Diagnosis not present

## 2016-01-04 ENCOUNTER — Other Ambulatory Visit: Payer: Self-pay | Admitting: Family Medicine

## 2016-01-16 ENCOUNTER — Other Ambulatory Visit (INDEPENDENT_AMBULATORY_CARE_PROVIDER_SITE_OTHER): Payer: Commercial Managed Care - PPO

## 2016-01-16 DIAGNOSIS — R000 Tachycardia, unspecified: Secondary | ICD-10-CM | POA: Diagnosis not present

## 2016-01-16 DIAGNOSIS — E669 Obesity, unspecified: Secondary | ICD-10-CM

## 2016-01-16 DIAGNOSIS — I10 Essential (primary) hypertension: Secondary | ICD-10-CM

## 2016-01-16 DIAGNOSIS — E785 Hyperlipidemia, unspecified: Secondary | ICD-10-CM | POA: Diagnosis not present

## 2016-01-16 DIAGNOSIS — R350 Frequency of micturition: Secondary | ICD-10-CM

## 2016-01-16 LAB — CBC
HCT: 48.3 % (ref 39.0–52.0)
HEMOGLOBIN: 16.5 g/dL (ref 13.0–17.0)
MCHC: 34.1 g/dL (ref 30.0–36.0)
MCV: 86.5 fl (ref 78.0–100.0)
PLATELETS: 229 10*3/uL (ref 150.0–400.0)
RBC: 5.58 Mil/uL (ref 4.22–5.81)
RDW: 13.9 % (ref 11.5–15.5)
WBC: 9.4 10*3/uL (ref 4.0–10.5)

## 2016-01-16 LAB — TSH: TSH: 1.1 u[IU]/mL (ref 0.35–4.50)

## 2016-01-16 LAB — LIPID PANEL
CHOL/HDL RATIO: 5
CHOLESTEROL: 161 mg/dL (ref 0–200)
HDL: 35.4 mg/dL — ABNORMAL LOW (ref >39.00)
LDL CALC: 86 mg/dL (ref 0–99)
NONHDL: 126.02
Triglycerides: 198 mg/dL — ABNORMAL HIGH (ref 0.0–149.0)
VLDL: 39.6 mg/dL (ref 0.0–40.0)

## 2016-01-16 LAB — COMPREHENSIVE METABOLIC PANEL
ALT: 24 U/L (ref 0–53)
AST: 16 U/L (ref 0–37)
Albumin: 4.1 g/dL (ref 3.5–5.2)
Alkaline Phosphatase: 66 U/L (ref 39–117)
BILIRUBIN TOTAL: 0.5 mg/dL (ref 0.2–1.2)
BUN: 14 mg/dL (ref 6–23)
CALCIUM: 9.6 mg/dL (ref 8.4–10.5)
CHLORIDE: 102 meq/L (ref 96–112)
CO2: 29 meq/L (ref 19–32)
CREATININE: 0.73 mg/dL (ref 0.40–1.50)
GFR: 128.76 mL/min (ref >60.00)
Glucose, Bld: 118 mg/dL — ABNORMAL HIGH (ref 70–99)
Potassium: 4 mEq/L (ref 3.5–5.1)
SODIUM: 138 meq/L (ref 135–145)
Total Protein: 7.2 g/dL (ref 6.0–8.3)

## 2016-01-16 LAB — HEMOGLOBIN A1C: HEMOGLOBIN A1C: 6 % (ref 4.6–6.5)

## 2016-01-22 ENCOUNTER — Ambulatory Visit (INDEPENDENT_AMBULATORY_CARE_PROVIDER_SITE_OTHER): Payer: Commercial Managed Care - PPO

## 2016-01-22 ENCOUNTER — Ambulatory Visit (INDEPENDENT_AMBULATORY_CARE_PROVIDER_SITE_OTHER): Payer: Commercial Managed Care - PPO | Admitting: Orthopaedic Surgery

## 2016-01-22 DIAGNOSIS — M25511 Pain in right shoulder: Secondary | ICD-10-CM | POA: Diagnosis not present

## 2016-01-22 DIAGNOSIS — S32424D Nondisplaced fracture of posterior wall of right acetabulum, subsequent encounter for fracture with routine healing: Secondary | ICD-10-CM | POA: Diagnosis not present

## 2016-01-22 DIAGNOSIS — R102 Pelvic and perineal pain: Secondary | ICD-10-CM

## 2016-01-22 MED ORDER — OXYCODONE-ACETAMINOPHEN 10-325 MG PO TABS: 1.0000 | ORAL_TABLET | Freq: Three times a day (TID) | ORAL | 0 refills | Status: DC | PRN

## 2016-01-22 MED ORDER — LIDOCAINE HCL 1 % IJ SOLN
3.0000 mL | INTRAMUSCULAR | Status: AC | PRN
  Administered 2016-01-22: 3 mL

## 2016-01-22 MED ORDER — METHYLPREDNISOLONE ACETATE 40 MG/ML IJ SUSP
40.0000 mg | INTRAMUSCULAR | Status: AC | PRN
  Administered 2016-01-22: 40 mg via INTRA_ARTICULAR
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
## 2016-01-23 ENCOUNTER — Telehealth: Payer: Self-pay | Admitting: Family Medicine

## 2016-01-23 ENCOUNTER — Other Ambulatory Visit (INDEPENDENT_AMBULATORY_CARE_PROVIDER_SITE_OTHER): Payer: Self-pay

## 2016-01-23 ENCOUNTER — Encounter: Payer: 59 | Admitting: Family Medicine

## 2016-01-23 ENCOUNTER — Encounter: Payer: Commercial Managed Care - PPO | Admitting: Family Medicine

## 2016-01-23 DIAGNOSIS — M25551 Pain in right hip: Secondary | ICD-10-CM

## 2016-01-23 NOTE — Telephone Encounter (Signed)
Patient had to reschedule his CPE appointment today to April which is the next available CPE slot. He would like to know if he needs to come in for labs before then or if he needs a follow up appointment before then. Please advise  Phone: [phone]

## 2016-01-23 NOTE — Telephone Encounter (Signed)
If he feels OK am OK to just check labs for now. hgbaic cmp, cbc tsh and lipid for hyperlipidemia

## 2016-01-24 ENCOUNTER — Other Ambulatory Visit: Payer: Self-pay | Admitting: Family Medicine

## 2016-01-24 DIAGNOSIS — E785 Hyperlipidemia, unspecified: Secondary | ICD-10-CM

## 2016-01-24 NOTE — Telephone Encounter (Signed)
PT was returning call, pt states can call at the [phone]. He will be waiting for return called.

## 2016-01-24 NOTE — Telephone Encounter (Signed)
Called left message to call back 

## 2016-01-24 NOTE — Telephone Encounter (Signed)
Patient contacted/scheduled appt. Put order in.

## 2016-01-29 ENCOUNTER — Other Ambulatory Visit: Payer: Commercial Managed Care - PPO

## 2016-01-29 ENCOUNTER — Ambulatory Visit
Admission: RE | Admit: 2016-01-29 | Discharge: 2016-01-29 | Disposition: A | Discharge status: Home / Self Care | Payer: Commercial Managed Care - PPO | Source: Ambulatory Visit | Attending: Orthopaedic Surgery | Admitting: Orthopaedic Surgery

## 2016-01-29 DIAGNOSIS — M25551 Pain in right hip: Secondary | ICD-10-CM

## 2016-02-05 ENCOUNTER — Ambulatory Visit (INDEPENDENT_AMBULATORY_CARE_PROVIDER_SITE_OTHER): Payer: Commercial Managed Care - PPO | Admitting: Physician Assistant

## 2016-02-11 ENCOUNTER — Telehealth (INDEPENDENT_AMBULATORY_CARE_PROVIDER_SITE_OTHER): Payer: Self-pay | Admitting: Orthopaedic Surgery

## 2016-02-11 NOTE — Telephone Encounter (Signed)
Patient called- left message on my vm. He stated he wants to pick up his scans.  His callback [phone] when ready

## 2016-02-12 ENCOUNTER — Ambulatory Visit (INDEPENDENT_AMBULATORY_CARE_PROVIDER_SITE_OTHER): Payer: Commercial Managed Care - PPO | Admitting: Orthopaedic Surgery

## 2016-02-12 ENCOUNTER — Encounter (INDEPENDENT_AMBULATORY_CARE_PROVIDER_SITE_OTHER): Payer: Self-pay | Admitting: Orthopaedic Surgery

## 2016-02-12 VITALS — Ht 73.5 in | Wt 341.0 lb

## 2016-02-12 DIAGNOSIS — M7541 Impingement syndrome of right shoulder: Secondary | ICD-10-CM | POA: Diagnosis not present

## 2016-02-12 DIAGNOSIS — S32424S Nondisplaced fracture of posterior wall of right acetabulum, sequela: Secondary | ICD-10-CM

## 2016-02-12 NOTE — Telephone Encounter (Signed)
CD is made and placed up front for pick-up.  Tried calling patient with no answer.
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
## 2016-02-13 ENCOUNTER — Other Ambulatory Visit (INDEPENDENT_AMBULATORY_CARE_PROVIDER_SITE_OTHER): Payer: Self-pay

## 2016-02-13 DIAGNOSIS — M25551 Pain in right hip: Secondary | ICD-10-CM

## 2016-03-03 DIAGNOSIS — S43004A Unspecified dislocation of right shoulder joint, initial encounter: Secondary | ICD-10-CM

## 2016-03-03 HISTORY — DX: Unspecified dislocation of right shoulder joint, initial encounter: S43.004A

## 2016-03-06 ENCOUNTER — Other Ambulatory Visit: Payer: Self-pay | Admitting: Family Medicine

## 2016-03-09 ENCOUNTER — Other Ambulatory Visit: Payer: Self-pay | Admitting: *Deleted

## 2016-03-09 MED ORDER — IBUPROFEN-FAMOTIDINE 800-26.6 MG PO TABS: ORAL_TABLET | ORAL | 3 refills | Status: DC

## 2016-03-09 NOTE — Telephone Encounter (Signed)
Refill done.  

## 2016-03-28 ENCOUNTER — Other Ambulatory Visit: Payer: Self-pay | Admitting: Family Medicine

## 2016-04-13 ENCOUNTER — Other Ambulatory Visit: Payer: Self-pay | Admitting: Family Medicine

## 2016-04-16 ENCOUNTER — Encounter: Payer: Commercial Managed Care - PPO | Admitting: Family Medicine

## 2016-04-22 ENCOUNTER — Other Ambulatory Visit (INDEPENDENT_AMBULATORY_CARE_PROVIDER_SITE_OTHER): Payer: Commercial Managed Care - PPO

## 2016-04-22 DIAGNOSIS — E785 Hyperlipidemia, unspecified: Secondary | ICD-10-CM

## 2016-04-22 LAB — CBC
HEMATOCRIT: 49.9 % (ref 39.0–52.0)
HEMOGLOBIN: 17.2 g/dL — AB (ref 13.0–17.0)
MCHC: 34.4 g/dL (ref 30.0–36.0)
MCV: 89.2 fl (ref 78.0–100.0)
PLATELETS: 226 10*3/uL (ref 150.0–400.0)
RBC: 5.59 Mil/uL (ref 4.22–5.81)
RDW: 13.8 % (ref 11.5–15.5)
WBC: 8.6 10*3/uL (ref 4.0–10.5)

## 2016-04-22 LAB — COMPREHENSIVE METABOLIC PANEL
ALT: 22 U/L (ref 0–53)
AST: 15 U/L (ref 0–37)
Albumin: 4.1 g/dL (ref 3.5–5.2)
Alkaline Phosphatase: 64 U/L (ref 39–117)
BILIRUBIN TOTAL: 0.5 mg/dL (ref 0.2–1.2)
BUN: 15 mg/dL (ref 6–23)
CO2: 29 meq/L (ref 19–32)
Calcium: 10 mg/dL (ref 8.4–10.5)
Chloride: 102 mEq/L (ref 96–112)
Creatinine, Ser: 0.89 mg/dL (ref 0.40–1.50)
GFR: 102.29 mL/min (ref >60.00)
GLUCOSE: 129 mg/dL — AB (ref 70–99)
Potassium: 3.9 mEq/L (ref 3.5–5.1)
SODIUM: 139 meq/L (ref 135–145)
Total Protein: 7.3 g/dL (ref 6.0–8.3)

## 2016-04-22 LAB — HEMOGLOBIN A1C: HEMOGLOBIN A1C: 5.9 % (ref 4.6–6.5)

## 2016-04-22 LAB — LDL CHOLESTEROL, DIRECT: LDL DIRECT: 103 mg/dL

## 2016-04-22 LAB — LIPID PANEL
CHOL/HDL RATIO: 5
Cholesterol: 167 mg/dL (ref 0–200)
HDL: 35.5 mg/dL — AB (ref >39.00)
NONHDL: 131.11
TRIGLYCERIDES: 203 mg/dL — AB (ref 0.0–149.0)
VLDL: 40.6 mg/dL — ABNORMAL HIGH (ref 0.0–40.0)

## 2016-04-22 LAB — TSH: TSH: 2.1 u[IU]/mL (ref 0.35–4.50)

## 2016-04-30 ENCOUNTER — Encounter: Payer: Self-pay | Admitting: Family Medicine

## 2016-04-30 ENCOUNTER — Ambulatory Visit (INDEPENDENT_AMBULATORY_CARE_PROVIDER_SITE_OTHER): Payer: Commercial Managed Care - PPO | Admitting: Family Medicine

## 2016-04-30 VITALS — BP 107/72 | HR 86 | Temp 98.6°F | Ht 74.0 in | Wt 326.6 lb

## 2016-04-30 DIAGNOSIS — E6609 Other obesity due to excess calories: Secondary | ICD-10-CM | POA: Diagnosis not present

## 2016-04-30 DIAGNOSIS — E1169 Type 2 diabetes mellitus with other specified complication: Secondary | ICD-10-CM | POA: Diagnosis not present

## 2016-04-30 DIAGNOSIS — E669 Obesity, unspecified: Secondary | ICD-10-CM

## 2016-04-30 DIAGNOSIS — E785 Hyperlipidemia, unspecified: Secondary | ICD-10-CM

## 2016-04-30 DIAGNOSIS — R000 Tachycardia, unspecified: Secondary | ICD-10-CM

## 2016-04-30 DIAGNOSIS — Z23 Encounter for immunization: Secondary | ICD-10-CM | POA: Diagnosis not present

## 2016-04-30 DIAGNOSIS — I10 Essential (primary) hypertension: Secondary | ICD-10-CM | POA: Diagnosis not present

## 2016-04-30 DIAGNOSIS — Z0000 Encounter for general adult medical examination without abnormal findings: Secondary | ICD-10-CM

## 2016-04-30 DIAGNOSIS — F319 Bipolar disorder, unspecified: Secondary | ICD-10-CM | POA: Diagnosis not present

## 2016-04-30 MED ORDER — GABAPENTIN 300 MG PO CAPS: ORAL_CAPSULE | ORAL | 3 refills | Status: DC

## 2016-04-30 MED ORDER — OXYCODONE-ACETAMINOPHEN 10-325 MG PO TABS: 1.0000 | ORAL_TABLET | Freq: Three times a day (TID) | ORAL | 0 refills | Status: DC | PRN

## 2016-04-30 NOTE — Assessment & Plan Note (Signed)
Well controlled, no changes to meds. Encouraged heart healthy diet such as the DASH diet and exercise as tolerated.  °

## 2016-04-30 NOTE — Assessment & Plan Note (Signed)
RRR 

## 2016-04-30 NOTE — Patient Instructions (Signed)
Preventive Care 18-39 Years, Male Preventive care refers to lifestyle choices and visits with your health care provider that can promote health and wellness. What does preventive care include?  A yearly physical exam. This is also called an annual well check.  Dental exams once or twice a year.  Routine eye exams. Ask your health care provider how often you should have your eyes checked.  Personal lifestyle choices, including: ? Daily care of your teeth and gums. ? Regular physical activity. ? Eating a healthy diet. ? Avoiding tobacco and drug use. ? Limiting alcohol use. ? Practicing safe sex. What happens during an annual well check? The services and screenings done by your health care provider during your annual well check will depend on your age, overall health, lifestyle risk factors, and family history of disease. Counseling Your health care provider may ask you questions about your:  Alcohol use.  Tobacco use.  Drug use.  Emotional well-being.  Home and relationship well-being.  Sexual activity.  Eating habits.  Work and work environment.  Screening You may have the following tests or measurements:  Height, weight, and BMI.  Blood pressure.  Lipid and cholesterol levels. These may be checked every 5 years starting at age 20.  Diabetes screening. This is done by checking your blood sugar (glucose) after you have not eaten for a while (fasting).  Skin check.  Hepatitis C blood test.  Hepatitis B blood test.  Sexually transmitted disease (STD) testing.  Discuss your test results, treatment options, and if necessary, the need for more tests with your health care provider. Vaccines Your health care provider may recommend certain vaccines, such as:  Influenza vaccine. This is recommended every year.  Tetanus, diphtheria, and acellular pertussis (Tdap, Td) vaccine. You may need a Td booster every 10 years.  Varicella vaccine. You may need this if you  have not been vaccinated.  HPV vaccine. If you are 26 or younger, you may need three doses over 6 months.  Measles, mumps, and rubella (MMR) vaccine. You may need at least one dose of MMR.You may also need a second dose.  Pneumococcal 13-valent conjugate (PCV13) vaccine. You may need this if you have certain conditions and have not been vaccinated.  Pneumococcal polysaccharide (PPSV23) vaccine. You may need one or two doses if you smoke cigarettes or if you have certain conditions.  Meningococcal vaccine. One dose is recommended if you are age 19-21 years and a first-year college student living in a residence hall, or if you have one of several medical conditions. You may also need additional booster doses.  Hepatitis A vaccine. You may need this if you have certain conditions or if you travel or work in places where you may be exposed to hepatitis A.  Hepatitis B vaccine. You may need this if you have certain conditions or if you travel or work in places where you may be exposed to hepatitis B.  Haemophilus influenzae type b (Hib) vaccine. You may need this if you have certain risk factors.  Talk to your health care provider about which screenings and vaccines you need and how often you need them. This information is not intended to replace advice given to you by your health care provider. Make sure you discuss any questions you have with your health care provider. Document Released: 03/03/2001 Document Revised: 09/25/2015 Document Reviewed: 11/06/2014 Elsevier Interactive Patient Education  2017 [employer]  

## 2016-04-30 NOTE — Assessment & Plan Note (Signed)
Encouraged heart healthy diet, increase exercise, avoid trans fats, consider a krill oil cap daily 

## 2016-04-30 NOTE — Assessment & Plan Note (Signed)
hgba1c acceptable, minimize simple carbs. Increase exercise as tolerated. Continue current meds 

## 2016-04-30 NOTE — Progress Notes (Signed)
Patient ID: Colin Bennett, male   DOB: Jan 02, 1981, 36 y.o.   MRN: [id number]   Subjective:  I acted as a Education administrator for Penni Homans, [place], [place]   Patient ID: Colin Bennett, male    DOB: [birth year], 36 y.o.   MRN: [id number]  Chief Complaint  Patient presents with  . Annual Exam  . Hyperlipidemia  . Hypertension    HPI  Patient is in today for an annual examination. Patient has a Hx of HTN, Type II Diabetes, hyperlipidemia. Patient states that he recently lost his Mother. Patient has no acute concerns noted at this time. She notes significant sadness at the loss of his mother but does not feel his bipolar disorder has flared dramatically and he does not feel he needs to change medications. No suicidal ideation but he does endorse some anhedonia. Continues to struggle with chronic pain but is improving slowly. Has not increased his exercise.not following a heart healthy diet. Denies CP/palp/SOB/HA/congestion/fevers/GI or GU c/o. Taking meds as prescribed  Patient Care Team: Mosie Lukes, MD as PCP - General (Family Medicine) Rigoberto Noel, MD as Consulting Physician (Pulmonary Disease) Lyndal Pulley, DO as Attending Physician (Family Medicine)   Past Medical History:  Diagnosis Date  . Bipolar 1 disorder ([place])   . Bipolar disorder, unspecified ([place]) 08/10/2012  . Diabetes mellitus   . Diabetes mellitus type 2 in obese ([place]) 01/26/2014  . Diabetes mellitus type 2 in obese ([place]) 06/10/2014  . HTN (hypertension) 05/10/2012  . Kidney stones   . Nephrolithiasis 06/10/2014  . Obesity, unspecified 11/09/2012  . Pain, joint, shoulder, right 04/09/2013  . Preventative health care 05/14/2012  . Renal lithiasis 08/10/2012  . Rotator cuff tear, right 04/09/2013    Past Surgical History:  Procedure Laterality Date  . CORONARY ARTERY BYPASS GRAFT      Family History  Problem Relation Age of Onset  . Hypertension Other   . Diabetes Other   . Diabetes Mother   . Hypertension Mother   .  Obesity Mother   . Hypertension Father   . Heart disease Father   . Cancer Maternal Aunt   . Congestive Heart Failure Maternal Grandfather   . Cancer Paternal Grandmother     Social History   Social History  . Marital status: Married    Spouse name: N/A  . Number of children: 0  . 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 Smoking status: Never Smoker  . Smokeless tobacco: Never Used  . Alcohol use No  . Drug use: No  . Sexual activity: Not on file   Other Topics Concern  . Not on file   Social History Narrative  . No narrative on file    Outpatient Medications Prior to Visit  Medication Sig Dispense Refill  . alendronate (FOSAMAX) 70 MG tablet Take 1 tablet (70 mg total) by mouth every 7 (seven) days. Take with a full glass of water on an empty stomach. 4 tablet 11  . canagliflozin (INVOKANA) 300 MG TABS tablet Take 1 tablet (300 mg total) by mouth daily before breakfast. Patient on Metformin and failed Glimeperide 30 tablet 11  . doxycycline (VIBRA-TABS) 100 MG tablet Take 1 tablet (100 mg total) by mouth 2 (two) times daily. Can give caps or generic 20 tablet 0  . EFFEXOR XR 150 MG 24 hr capsule TAKE ONE CAPSULE BY MOUTH ONCE DAILY WITH  BREAKFAST 30 capsule 1  . fluconazole (DIFLUCAN) 150 MG tablet 2 tabs po daily x 3 days then 1 tab po weekly x 3 weeks 9 tablet 1  . Ibuprofen-Famotidine 800-26.6 MG TABS Take 1 tablet 3 times daily as needed. 270 tablet 3  . JANUVIA 100 MG tablet TAKE ONE TABLET BY MOUTH ONCE DAILY 30 tablet 0  . lamoTRIgine (LAMICTAL) 100 MG tablet Take 1 tablet (100 mg total) by mouth daily. 90 tablet 1  . lisinopril (PRINIVIL,ZESTRIL) 5 MG tablet Take 1 tablet (5 mg total) by mouth daily. 90 tablet 2  . LYRICA 100 MG capsule TAKE ONE CAPSULE BY MOUTH TWICE DAILY 60 capsule 2  . Melatonin 3 MG CAPS Take 3 mg by mouth once.    . metFORMIN (GLUCOPHAGE) 500 MG tablet TAKE  ONE TABLET BY MOUTH THREE TIMES DAILY BETWEEN  MEALS 90 tablet 5  . nystatin cream (MYCOSTATIN) Apply 1 application topically 3 (three) times daily. 30 g 5  . pravastatin (PRAVACHOL) 20 MG tablet TAKE ONE TABLET BY MOUTH AT BEDTIME 30 tablet 3  . silver sulfADIAZINE (SILVADENE) 1 % cream Apply topically.    Marland Kitchen tiZANidine (ZANAFLEX) 4 MG capsule Take 1 capsule (4 mg total) by mouth 3 (three) times daily as needed for muscle spasms. 30 capsule 2  . oxyCODONE-acetaminophen (PERCOCET) 10-325 MG tablet Take 1 tablet by mouth every 8 (eight) hours as needed for pain. 60 tablet 0  . amoxicillin-clavulanate (AUGMENTIN) 875-125 MG tablet Take 1 tablet by mouth 2 (two) times daily. (Patient not taking: Reported on 04/30/2016) 14 tablet 0  . gabapentin (NEURONTIN) 300 MG capsule Take 300 mg by mouth.    Marland Kitchen tiZANidine (ZANAFLEX) 4 MG capsule TAKE ONE CAPSULE BY MOUTH THREE TIMES DAILY AS NEEDED FOR MUSCLE SPASMS (Patient not taking: Reported on 04/30/2016) 30 capsule 1  . Vitamin D, Ergocalciferol, (DRISDOL) 50000 units CAPS capsule Take 1 capsule (50,000 Units total) by mouth every 7 (seven) days. (Patient not taking: Reported on 04/30/2016) 12 capsule 0   No facility-administered medications prior to visit.     No Known Allergies  Review of Systems  Constitutional: Positive for malaise/fatigue. Negative for chills and fever.  HENT: Negative for congestion and hearing loss.   Eyes: Negative for discharge.  Respiratory: Negative for cough, sputum production and shortness of breath.   Cardiovascular: Negative for chest pain, palpitations and leg swelling.  Gastrointestinal: Negative for abdominal pain, blood in stool, constipation, diarrhea, heartburn, nausea and vomiting.  Genitourinary: Negative for dysuria, frequency, hematuria and urgency.  Musculoskeletal: Positive for back pain and joint pain. Negative for falls and myalgias.  Skin: Negative for rash.  Neurological: Negative for dizziness, sensory  change, loss of consciousness, weakness and headaches.  Endo/Heme/Allergies: Negative for environmental allergies. Does not bruise/bleed easily.  Psychiatric/Behavioral: Negative for depression and suicidal ideas. The patient is not nervous/anxious and does not have insomnia.        Objective:    Physical Exam  Constitutional: He is oriented to person, place, and time. He appears well-developed and well-nourished. No distress.  HENT:  Head: Normocephalic and atraumatic.  Eyes: Conjunctivae are normal.  Neck: Neck supple. No thyromegaly present.  Cardiovascular: Normal rate, regular rhythm and normal heart sounds.   No murmur heard. Pulmonary/Chest: Effort normal and breath sounds normal. No respiratory distress. He has no wheezes.  Abdominal: Soft. Bowel sounds are normal. He exhibits no mass. There is no tenderness.  Musculoskeletal: He exhibits no edema.  Lymphadenopathy:  He has no cervical adenopathy.  Neurological: He is alert and oriented to person, place, and time.  Skin: Skin is warm and dry.  Psychiatric: He has a normal mood and affect. His behavior is normal.    BP 107/72 (BP Location: Left Wrist, Patient Position: Sitting, Cuff Size: Normal)   Pulse 86   Temp 98.6 F (37 C) (Oral)   Ht 6\' 2"  (1.88 m)   Wt (!) 326 lb 9.6 oz (148.1 kg)   SpO2 99% Comment: RA  BMI 41.93 kg/m  Wt Readings from Last 3 Encounters:  04/30/16 (!) 326 lb 9.6 oz (148.1 kg)  02/12/16 (!) 341 lb (154.7 kg)  12/18/15 (!) 341 lb (154.7 kg)   BP Readings from Last 3 Encounters:  04/30/16 107/72  11/27/15 120/84  11/14/15 116/78     Immunization History  Administered Date(s) Administered  . Influenza, Seasonal, Injecte, Preservative Fre 02/01/2012  . Influenza,inj,Quad PF,36+ Mos 11/07/2012, 12/04/2013  . Pneumococcal Conjugate-13 04/30/2016  . Td 01/19/2006    Health Maintenance  Topic Date Due  . HIV Screening  06/01/1994  . OPHTHALMOLOGY EXAM  07/26/2015  . FOOT EXAM   11/28/2015  . TETANUS/TDAP  01/20/2016  . INFLUENZA VACCINE  08/19/2016  . HEMOGLOBIN A1C  10/22/2016  . PNEUMOCOCCAL POLYSACCHARIDE VACCINE (2) 04/30/2021    Lab Results  Component Value Date   WBC 8.6 04/22/2016   HGB 17.2 (H) 04/22/2016   HCT 49.9 04/22/2016   PLT 226.0 04/22/2016   GLUCOSE 129 (H) 04/22/2016   CHOL 167 04/22/2016   TRIG 203.0 (H) 04/22/2016   HDL 35.50 (L) 04/22/2016   LDLDIRECT 103.0 04/22/2016   LDLCALC 86 01/16/2016   ALT 22 04/22/2016   AST 15 04/22/2016   NA 139 04/22/2016   K 3.9 04/22/2016   CL 102 04/22/2016   CREATININE 0.89 04/22/2016   BUN 15 04/22/2016   CO2 29 04/22/2016   TSH 2.10 04/22/2016   HGBA1C 5.9 04/22/2016   MICROALBUR 1.0 03/27/2015    Lab Results  Component Value Date   TSH 2.10 04/22/2016   Lab Results  Component Value Date   WBC 8.6 04/22/2016   HGB 17.2 (H) 04/22/2016   HCT 49.9 04/22/2016   MCV 89.2 04/22/2016   PLT 226.0 04/22/2016   Lab Results  Component Value Date   NA 139 04/22/2016   K 3.9 04/22/2016   CO2 29 04/22/2016   GLUCOSE 129 (H) 04/22/2016   BUN 15 04/22/2016   CREATININE 0.89 04/22/2016   BILITOT 0.5 04/22/2016   ALKPHOS 64 04/22/2016   AST 15 04/22/2016   ALT 22 04/22/2016   PROT 7.3 04/22/2016   ALBUMIN 4.1 04/22/2016   CALCIUM 10.0 04/22/2016   GFR 102.29 04/22/2016   Lab Results  Component Value Date   CHOL 167 04/22/2016   Lab Results  Component Value Date   HDL 35.50 (L) 04/22/2016   Lab Results  Component Value Date   LDLCALC 86 01/16/2016   Lab Results  Component Value Date   TRIG 203.0 (H) 04/22/2016   Lab Results  Component Value Date   CHOLHDL 5 04/22/2016   Lab Results  Component Value Date   HGBA1C 5.9 04/22/2016         Assessment & Plan:   Problem List Items Addressed This Visit    Hyperlipidemia    Encouraged heart healthy diet, increase exercise, avoid trans fats, consider a krill oil cap daily      HTN (hypertension)  Well controlled, no  changes to meds. Encouraged heart healthy diet such as the DASH diet and exercise as tolerated.       Bipolar disorder, unspecified ([place])    Very sad due to his mother's recent death but overall he feels he is managing at this time. He will let us know if he needs to make any changes.      Obesity    Encouraged DASH diet, decrease po intake and increase exercise as tolerated. Needs 7-8 hours of sleep nightly. Avoid trans fats, eat small, frequent meals every 4-5 hours with lean proteins, complex carbs and healthy fats. Minimize simple carbs, bariatric referral      Preventative health care    Patient encouraged to maintain heart healthy diet, regular exercise, adequate sleep. Consider daily probiotics. Take medications as prescribed. Labs reviewed      Diabetes mellitus type 2 in obese ([facility])    hgba1c acceptable, minimize simple carbs. Increase exercise as tolerated. Continue current meds      Tachycardia    RRR       Other Visit Diagnoses    Need for vaccination with 13-polyvalent pneumococcal conjugate vaccine    -  Primary   Relevant Orders   Pneumococcal conjugate vaccine 13-valent (Completed)      I have discontinued Mr. Hegg's amoxicillin-clavulanate, Vitamin D (Ergocalciferol), gabapentin, and gabapentin. I am also having him start on gabapentin. Additionally, I am having him maintain his lisinopril, fluconazole, tiZANidine, nystatin cream, canagliflozin, lamoTRIgine, silver sulfADIAZINE, doxycycline, alendronate, pravastatin, Melatonin, EFFEXOR XR, Ibuprofen-Famotidine, metFORMIN, LYRICA, JANUVIA, and oxyCODONE-acetaminophen.  Meds ordered this encounter  Medications  . DISCONTD: gabapentin (NEURONTIN) 300 MG capsule    Sig: Take 300 mg by mouth at bedtime.  Marland Kitchen oxyCODONE-acetaminophen (PERCOCET) 10-325 MG tablet    Sig: Take 1 tablet by mouth every 8 (eight) hours as needed for pain.    Dispense:  60 tablet    Refill:  0  . gabapentin (NEURONTIN) 300 MG capsule     Sig: Take 2 tabs po qhs    Dispense:  60 capsule    Refill:  3    CMA served as scribe during this visit. History, Physical and Plan performed by medical provider. Documentation and orders reviewed and attested to.  Penni Homans, MD

## 2016-04-30 NOTE — Progress Notes (Signed)
Pre visit review using our clinic review tool, if applicable. No additional management support is needed unless otherwise documented below in the visit note. 

## 2016-05-03 NOTE — Assessment & Plan Note (Signed)
Very sad due to his mother's recent death but overall he feels he is managing at this time. He will let us know if he needs to make any changes.

## 2016-05-03 NOTE — Assessment & Plan Note (Signed)
Patient encouraged to maintain heart healthy diet, regular exercise, adequate sleep. Consider daily probiotics. Take medications as prescribed. Labs reviewed 

## 2016-05-03 NOTE — Assessment & Plan Note (Signed)
Encouraged DASH diet, decrease po intake and increase exercise as tolerated. Needs 7-8 hours of sleep nightly. Avoid trans fats, eat small, frequent meals every 4-5 hours with lean proteins, complex carbs and healthy fats. Minimize simple carbs, bariatric referral 

## 2016-05-06 ENCOUNTER — Other Ambulatory Visit: Payer: Self-pay | Admitting: Family Medicine

## 2016-05-24 ENCOUNTER — Other Ambulatory Visit: Payer: Self-pay | Admitting: Family Medicine

## 2016-05-26 ENCOUNTER — Encounter: Payer: Self-pay | Admitting: Family Medicine

## 2016-05-27 MED ORDER — GABAPENTIN 300 MG PO CAPS: 600.0000 mg | ORAL_CAPSULE | Freq: Every day | ORAL | 0 refills | Status: DC

## 2016-05-27 MED ORDER — METFORMIN HCL 500 MG PO TABS: 500.0000 mg | ORAL_TABLET | Freq: Three times a day (TID) | ORAL | 0 refills | Status: DC

## 2016-05-27 MED ORDER — CANAGLIFLOZIN 300 MG PO TABS: 300.0000 mg | ORAL_TABLET | Freq: Every day | ORAL | 0 refills | Status: DC

## 2016-05-27 MED ORDER — LAMOTRIGINE 100 MG PO TABS: 100.0000 mg | ORAL_TABLET | Freq: Every day | ORAL | 0 refills | Status: DC

## 2016-05-27 MED ORDER — JANUVIA 100 MG PO TABS: 100.0000 mg | ORAL_TABLET | Freq: Every day | ORAL | 0 refills | Status: DC

## 2016-05-27 MED ORDER — VENLAFAXINE HCL ER 150 MG PO CP24: 150.0000 mg | ORAL_CAPSULE | Freq: Every day | ORAL | 0 refills | Status: DC

## 2016-05-27 MED ORDER — PRAVASTATIN SODIUM 20 MG PO TABS: 20.0000 mg | ORAL_TABLET | Freq: Every day | ORAL | 0 refills | Status: DC

## 2016-05-27 MED ORDER — LISINOPRIL 5 MG PO TABS: 5.0000 mg | ORAL_TABLET | Freq: Every day | ORAL | 0 refills | Status: DC

## 2016-06-19 ENCOUNTER — Other Ambulatory Visit: Payer: Self-pay | Admitting: Family Medicine

## 2016-07-17 ENCOUNTER — Encounter: Payer: Self-pay | Admitting: Cardiology

## 2016-07-19 ENCOUNTER — Other Ambulatory Visit: Payer: Self-pay | Admitting: Family Medicine

## 2016-07-22 ENCOUNTER — Other Ambulatory Visit: Payer: Self-pay | Admitting: Family Medicine

## 2016-07-23 ENCOUNTER — Other Ambulatory Visit: Payer: Self-pay | Admitting: Emergency Medicine

## 2016-07-23 ENCOUNTER — Other Ambulatory Visit (INDEPENDENT_AMBULATORY_CARE_PROVIDER_SITE_OTHER): Payer: Commercial Managed Care - PPO

## 2016-07-23 DIAGNOSIS — E785 Hyperlipidemia, unspecified: Secondary | ICD-10-CM

## 2016-07-23 DIAGNOSIS — I10 Essential (primary) hypertension: Secondary | ICD-10-CM

## 2016-07-23 LAB — LIPID PANEL
CHOLESTEROL: 143 mg/dL (ref 0–200)
HDL: 37 mg/dL — ABNORMAL LOW (ref >39.00)
LDL Cholesterol: 75 mg/dL (ref 0–99)
NonHDL: 106.36
TRIGLYCERIDES: 158 mg/dL — AB (ref 0.0–149.0)
Total CHOL/HDL Ratio: 4
VLDL: 31.6 mg/dL (ref 0.0–40.0)

## 2016-07-23 LAB — CBC
HEMATOCRIT: 48.4 % (ref 39.0–52.0)
HEMOGLOBIN: 16.4 g/dL (ref 13.0–17.0)
MCHC: 33.9 g/dL (ref 30.0–36.0)
MCV: 89.3 fl (ref 78.0–100.0)
PLATELETS: 210 10*3/uL (ref 150.0–400.0)
RBC: 5.42 Mil/uL (ref 4.22–5.81)
RDW: 13.7 % (ref 11.5–15.5)
WBC: 8.6 10*3/uL (ref 4.0–10.5)

## 2016-07-23 LAB — BASIC METABOLIC PANEL
BUN: 15 mg/dL (ref 6–23)
CHLORIDE: 104 meq/L (ref 96–112)
CO2: 22 mEq/L (ref 19–32)
Calcium: 9.6 mg/dL (ref 8.4–10.5)
Creatinine, Ser: 0.68 mg/dL (ref 0.40–1.50)
GFR: 139.35 mL/min (ref >60.00)
Glucose, Bld: 114 mg/dL — ABNORMAL HIGH (ref 70–99)
POTASSIUM: 3.8 meq/L (ref 3.5–5.1)
SODIUM: 137 meq/L (ref 135–145)

## 2016-07-23 LAB — TSH: TSH: 1.16 u[IU]/mL (ref 0.35–4.50)

## 2016-07-24 ENCOUNTER — Other Ambulatory Visit (INDEPENDENT_AMBULATORY_CARE_PROVIDER_SITE_OTHER): Payer: Commercial Managed Care - PPO

## 2016-07-24 DIAGNOSIS — E119 Type 2 diabetes mellitus without complications: Secondary | ICD-10-CM

## 2016-07-24 LAB — HEMOGLOBIN A1C: HEMOGLOBIN A1C: 5.9 % (ref 4.6–6.5)

## 2016-07-30 ENCOUNTER — Encounter: Payer: Self-pay | Admitting: Family Medicine

## 2016-07-30 ENCOUNTER — Telehealth: Payer: Self-pay | Admitting: Family Medicine

## 2016-07-30 ENCOUNTER — Ambulatory Visit (INDEPENDENT_AMBULATORY_CARE_PROVIDER_SITE_OTHER): Payer: Commercial Managed Care - PPO | Admitting: Family Medicine

## 2016-07-30 VITALS — BP 98/64 | HR 90 | Temp 98.2°F | Resp 18 | Ht 74.0 in | Wt 321.8 lb

## 2016-07-30 DIAGNOSIS — R0789 Other chest pain: Secondary | ICD-10-CM

## 2016-07-30 DIAGNOSIS — E669 Obesity, unspecified: Secondary | ICD-10-CM | POA: Diagnosis not present

## 2016-07-30 DIAGNOSIS — R000 Tachycardia, unspecified: Secondary | ICD-10-CM | POA: Diagnosis not present

## 2016-07-30 DIAGNOSIS — E1169 Type 2 diabetes mellitus with other specified complication: Secondary | ICD-10-CM

## 2016-07-30 DIAGNOSIS — E6609 Other obesity due to excess calories: Secondary | ICD-10-CM | POA: Diagnosis not present

## 2016-07-30 DIAGNOSIS — I10 Essential (primary) hypertension: Secondary | ICD-10-CM

## 2016-07-30 DIAGNOSIS — F319 Bipolar disorder, unspecified: Secondary | ICD-10-CM | POA: Diagnosis not present

## 2016-07-30 DIAGNOSIS — E785 Hyperlipidemia, unspecified: Secondary | ICD-10-CM

## 2016-07-30 MED ORDER — ASPIRIN EC 81 MG PO TBEC: 81.0000 mg | DELAYED_RELEASE_TABLET | Freq: Every day | ORAL | 0 refills | Status: DC

## 2016-07-30 MED ORDER — METFORMIN HCL 500 MG PO TABS: 500.0000 mg | ORAL_TABLET | Freq: Three times a day (TID) | ORAL | 1 refills | Status: AC

## 2016-07-30 MED ORDER — GABAPENTIN 300 MG PO CAPS: 600.0000 mg | ORAL_CAPSULE | Freq: Every day | ORAL | 0 refills | Status: DC

## 2016-07-30 MED ORDER — PREGABALIN 100 MG PO CAPS: 100.0000 mg | ORAL_CAPSULE | Freq: Two times a day (BID) | ORAL | 1 refills | Status: AC

## 2016-07-30 MED ORDER — LAMOTRIGINE 150 MG PO TABS: 150.0000 mg | ORAL_TABLET | Freq: Every day | ORAL | 1 refills | Status: AC

## 2016-07-30 MED ORDER — NITROGLYCERIN 0.4 MG SL SUBL: 0.4000 mg | SUBLINGUAL_TABLET | SUBLINGUAL | 0 refills | Status: AC | PRN

## 2016-07-30 MED ORDER — VENLAFAXINE HCL ER 225 MG PO TB24: 225.0000 mg | ORAL_TABLET | Freq: Every day | ORAL | 1 refills | Status: AC

## 2016-07-30 MED ORDER — CANAGLIFLOZIN 300 MG PO TABS: ORAL_TABLET | ORAL | 1 refills | Status: AC

## 2016-07-30 MED ORDER — JANUVIA 100 MG PO TABS: 100.0000 mg | ORAL_TABLET | Freq: Every day | ORAL | 1 refills | Status: AC

## 2016-07-30 MED ORDER — PRAVASTATIN SODIUM 20 MG PO TABS: 20.0000 mg | ORAL_TABLET | Freq: Every day | ORAL | 1 refills | Status: AC

## 2016-07-30 MED ORDER — LISINOPRIL 5 MG PO TABS: 2.5000 mg | ORAL_TABLET | Freq: Every day | ORAL | 1 refills | Status: AC

## 2016-07-30 NOTE — Telephone Encounter (Signed)
Pt is coming in for a 3 month fu and labs. Scheduled apt's per pt. Not showing scheduling instructions.      Please place orders for pt for October FU
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
## 2016-07-30 NOTE — Patient Instructions (Addendum)
mylanta prn for chest pain Carbohydrate Counting for Diabetes Mellitus, Adult Carbohydrate counting is a method for keeping track of how many carbohydrates you eat. Eating carbohydrates naturally increases the amount of sugar (glucose) in the blood. Counting how many carbohydrates you eat helps keep your blood glucose within normal limits, which helps you manage your diabetes (diabetes mellitus). It is important to know how many carbohydrates you can safely have in each meal. This is different for every person. A diet and nutrition specialist (registered dietitian) can help you make a meal plan and calculate how many carbohydrates you should have at each meal and snack. Carbohydrates are found in the following foods:  Grains, such as breads and cereals.  Dried beans and soy products.  Starchy vegetables, such as potatoes, peas, and corn.  Fruit and fruit juices.  Milk and yogurt.  Sweets and snack foods, such as cake, cookies, candy, chips, and soft drinks.  How do I count carbohydrates? There are two ways to count carbohydrates in food. You can use either of the methods or a combination of both. Reading "Nutrition Facts" on packaged food The "Nutrition Facts" list is included on the labels of almost all packaged foods and beverages in the [place] It includes:  The serving size.  Information about nutrients in each serving, including the grams (g) of carbohydrate per serving.  To use the "Nutrition Facts":  Decide how many servings you will have.  Multiply the number of servings by the number of carbohydrates per serving.  The resulting number is the total amount of carbohydrates that you will be having.  Learning standard serving sizes of other foods When you eat foods containing carbohydrates that are not packaged or do not include "Nutrition Facts" on the label, you need to measure the servings in order to count the amount of carbohydrates:  Measure the foods that you will eat  with a food scale or measuring cup, if needed.  Decide how many standard-size servings you will eat.  Multiply the number of servings by 15. Most carbohydrate-rich foods have about 15 g of carbohydrates per serving. ? For example, if you eat 8 oz (170 g) of strawberries, you will have eaten 2 servings and 30 g of carbohydrates (2 servings x 15 g = 30 g).  For foods that have more than one food mixed, such as soups and casseroles, you must count the carbohydrates in each food that is included.  The following list contains standard serving sizes of common carbohydrate-rich foods. Each of these servings has about 15 g of carbohydrates:   hamburger bun or  English muffin.   oz (15 mL) syrup.   oz (14 g) jelly.  1 slice of bread.  1 six-inch tortilla.  3 oz (85 g) cooked rice or pasta.  4 oz (113 g) cooked dried beans.  4 oz (113 g) starchy vegetable, such as peas, corn, or potatoes.  4 oz (113 g) hot cereal.  4 oz (113 g) mashed potatoes or  of a large baked potato.  4 oz (113 g) canned or frozen fruit.  4 oz (120 mL) fruit juice.  4-6 crackers.  6 chicken nuggets.  6 oz (170 g) unsweetened dry cereal.  6 oz (170 g) plain fat-free yogurt or yogurt sweetened with artificial sweeteners.  8 oz (240 mL) milk.  8 oz (170 g) fresh fruit or one small piece of fruit.  24 oz (680 g) popped popcorn.  Example of carbohydrate counting Sample meal  3  oz (85 g) chicken breast.  6 oz (170 g) brown rice.  4 oz (113 g) corn.  8 oz (240 mL) milk.  8 oz (170 g) strawberries with sugar-free whipped topping. Carbohydrate calculation 1. Identify the foods that contain carbohydrates: ? Rice. ? Corn. ? Milk. ? Strawberries. 2. Calculate how many servings you have of each food: ? 2 servings rice. ? 1 serving corn. ? 1 serving milk. ? 1 serving strawberries. 3. Multiply each number of servings by 15 g: ? 2 servings rice x 15 g = 30 g. ? 1 serving corn x 15 g = 15  g. ? 1 serving milk x 15 g = 15 g. ? 1 serving strawberries x 15 g = 15 g. 4. Add together all of the amounts to find the total grams of carbohydrates eaten: ? 30 g + 15 g + 15 g + 15 g = 75 g of carbohydrates total. This information is not intended to replace advice given to you by your health care provider. Make sure you discuss any questions you have with your health care provider. Document Released: 01/05/2005 Document Revised: 07/26/2015 Document Reviewed: 06/19/2015 Elsevier Interactive Patient Education  [employer].

## 2016-07-31 DIAGNOSIS — R079 Chest pain, unspecified: Secondary | ICD-10-CM | POA: Insufficient documentation

## 2016-07-31 DIAGNOSIS — R0789 Other chest pain: Secondary | ICD-10-CM

## 2016-07-31 HISTORY — DX: Other chest pain: R07.89

## 2016-07-31 NOTE — Assessment & Plan Note (Signed)
Improved with recheck 

## 2016-07-31 NOTE — Addendum Note (Signed)
Addended by: Magdalene Molly A on: 07/31/2016 02:21 PM   Modules accepted: Orders

## 2016-07-31 NOTE — Assessment & Plan Note (Addendum)
Well controlled, no changes to meds. Encouraged heart healthy diet such as the DASH diet and exercise as tolerated. EKG shows sinus tachycardia without any acute ST or T wave changes

## 2016-07-31 NOTE — Telephone Encounter (Signed)
Done

## 2016-07-31 NOTE — Assessment & Plan Note (Signed)
Patient notes an increase in episodes of chest discomfort . They occur randomly and he is unable to correlate them with food intake but he reports it may correlate with stress at times. Due to numerous risk factors will refer him to cardiology for likely stress test, he is asked to start an 81 mg ECASA and given SL NTG to use prn, if CP occurs and does not resolve with NTG after 3 doses to ER

## 2016-07-31 NOTE — Telephone Encounter (Signed)
Orders placed for October you can schedule.    thx  pc

## 2016-07-31 NOTE — Assessment & Plan Note (Signed)
Encouraged DASH diet, decrease po intake and increase exercise as tolerated. Needs 7-8 hours of sleep nightly. Avoid trans fats, eat small, frequent meals every 4-5 hours with lean proteins, complex carbs and healthy fats. Minimize simple carbs, bariatric referral 

## 2016-07-31 NOTE — Assessment & Plan Note (Signed)
hgba1c acceptable, minimize simple carbs. Increase exercise as tolerated. Continue current meds 

## 2016-07-31 NOTE — Assessment & Plan Note (Signed)
Tolerating statin, encouraged heart healthy diet, avoid trans fats, minimize simple carbs and saturated fats. Increase exercise as tolerated 

## 2016-08-03 ENCOUNTER — Encounter: Payer: Self-pay | Admitting: Acute Care

## 2016-08-03 ENCOUNTER — Ambulatory Visit (INDEPENDENT_AMBULATORY_CARE_PROVIDER_SITE_OTHER): Payer: Commercial Managed Care - PPO | Admitting: Acute Care

## 2016-08-03 VITALS — BP 126/84 | HR 85 | Ht 74.0 in | Wt 326.6 lb

## 2016-08-03 DIAGNOSIS — G4733 Obstructive sleep apnea (adult) (pediatric): Secondary | ICD-10-CM

## 2016-08-03 NOTE — Patient Instructions (Signed)
It is nice to meet you today. We will order a new machine since yours is 36 years old. If you have to have another sleep test just call the office so we can place the order. Continue on CPAP at bedtime. You appear to be benefiting from the treatment Goal is to wear for at least 4-6 hours each night for maximal clinical benefit. Continue to work on weight loss, as the link between excess weight  and sleep apnea is well established.  Do not drive if sleepy. We will provide you with a letter and your most recent down load  that shows your compliance with CPAP therapy. Follow up with 1 year with Dr. Elsworth Soho Please contact office for sooner follow up if symptoms do not improve or worsen or seek emergency care

## 2016-08-03 NOTE — Assessment & Plan Note (Signed)
Doing well on CPAP Compliant appears to be benefiting from therapy Plan We will order a new machine since yours is 36 years old. If you have to have another sleep test just call the office so we can place the order. Continue on CPAP at bedtime. You appear to be benefiting from the treatment Goal is to wear for at least 4-6 hours each night for maximal clinical benefit. Continue to work on weight loss, as the link between excess weight  and sleep apnea is well established.  Do not drive if sleepy. We will provide you with a letter and your most recent down load  that shows your compliance with CPAP therapy. Follow up with 1 year with Dr. Elsworth Soho Please contact office for sooner follow up if symptoms do not improve or worsen or seek emergency care

## 2016-08-03 NOTE — Progress Notes (Signed)
History of Present Illness Colin Bennett is a 36 y.o. male with OSA. He is followed by Dr. Elsworth Soho.  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 Currently treated with CPAP Therapy Mod OSA- AHI 17/h, nadir desatn 77%, corrected by CPAP 16 cm  08/03/2016 CPAP Follow up: Pt. Presents for compliance / therapeutic benefit check of CPAP therapy. Pt. States he is doing well with therapy. He is compliant with therapy 100% last 30 days. He has less daytime sleepiness. He wakes up refreshed from sleep. He is wearing CPAP for greater than 4 hours 98% of the time. He is currently unable to work due to a back injury, therefore his sleep has been of a regular pattern. He denies problems with equipment or mask.He has made a mask change to the F 11 , and he feels this is a better fit for him.AHI is 2.0. He states he has a good sleep routine , and has lost about 40 pounds in the last 6 months. He denies fever, chest pain, orthopnea, or hemoptysis. Patient is requesting a new machine as his machine is greater than 10 years old.  Test Results:  Down Load: CPAP S9 AutoSet Download 05/05/2016 through 08/02/2016 Compliance 90 at of 90 days or 100% Greater than 4 hours 88 days or 98% Less than 4 hours to days or 2% Set pressure 16 cm of water AHI equals 2.0 per hour No significant leaks detected Pressure set at 16 cm of water   CBC Latest Ref Rng & Units 07/23/2016 04/22/2016 01/16/2016  WBC 4.0 - 10.5 K/uL 8.6 8.6 9.4  Hemoglobin 13.0 - 17.0 g/dL 16.4 17.2(H) 16.5  Hematocrit 39.0 - 52.0 % 48.4 49.9 48.3  Platelets 150.0 - 400.0 K/uL 210.0 226.0 229.0    BMP Latest Ref Rng & Units 07/23/2016 04/22/2016 01/16/2016  Glucose 70 - 99 mg/dL 114(H) 129(H) 118(H)  BUN 6 - 23 mg/dL 15 15 14   Creatinine 0.40 - 1.50 mg/dL 0.68 0.89 0.73  Sodium 135 - 145 mEq/L 137 139 138  Potassium 3.5 - 5.1 mEq/L 3.8 3.9 4.0  Chloride 96 - 112 mEq/L 104 102 102  CO2 19 - 32 mEq/L 22 29 29   Calcium 8.4 - 10.5 mg/dL 9.6 10.0 9.6       Past medical hx Past Medical History:  Diagnosis Date  . Bipolar 1 disorder ([place])   . Bipolar disorder, unspecified ([place]) 08/10/2012  . Diabetes mellitus   . Diabetes mellitus type 2 in obese ([place]) 01/26/2014  . Diabetes mellitus type 2 in obese ([place]) 06/10/2014  . HTN (hypertension) 05/10/2012  . Kidney stones   . Nephrolithiasis 06/10/2014  . Obesity, unspecified 11/09/2012  . Pain, joint, shoulder, right 04/09/2013  . Preventative health care 05/14/2012  . Renal lithiasis 08/10/2012  . Rotator cuff tear, right 04/09/2013     Social History  Substance Use Topics  . Smoking status: Never Smoker  . Smokeless tobacco: Never Used  . Alcohol use No    Tobacco Cessation: Patient is a never smoker  Past surgical hx, Family hx, Social hx all reviewed.  Current Outpatient Prescriptions on File Prior to Visit  Medication Sig  . alendronate (FOSAMAX) 70 MG tablet Take 1 tablet (70 mg total) by mouth every 7 (seven) days. Take with a full glass of water on an empty stomach.  Marland Kitchen aspirin EC 81 MG tablet Take 1 tablet (81 mg total) by mouth daily.  . canagliflozin (INVOKANA) 300 MG TABS tablet TAKE ONE TABLET BY MOUTH  ONCE DAILY BEFORE  BREAKFAST  . gabapentin (NEURONTIN) 300 MG capsule Take 2 capsules (600 mg total) by mouth at bedtime.  Marland Kitchen HYDROcodone-acetaminophen (NORCO) 7.5-325 MG tablet Take 1 tablet by mouth every 6 (six) hours as needed.  Marland Kitchen JANUVIA 100 MG tablet Take 1 tablet (100 mg total) by mouth daily.  Marland Kitchen lamoTRIgine (LAMICTAL) 150 MG tablet Take 1 tablet (150 mg total) by mouth daily.  Marland Kitchen lisinopril (PRINIVIL,ZESTRIL) 5 MG tablet Take 0.5 tablets (2.5 mg total) by mouth daily.  . Melatonin 3 MG CAPS Take 3 mg by mouth once.  . metFORMIN (GLUCOPHAGE) 500 MG tablet Take 1 tablet (500 mg total) by mouth 3 (three) times daily with meals.  . nitroGLYCERIN (NITROSTAT) 0.4 MG SL tablet Place 1 tablet (0.4 mg total) under the tongue every 5 (five) minutes as needed for chest pain.  Marland Kitchen  nystatin cream (MYCOSTATIN) Apply 1 application topically 3 (three) times daily.  . pravastatin (PRAVACHOL) 20 MG tablet Take 1 tablet (20 mg total) by mouth at bedtime.  . pregabalin (LYRICA) 100 MG capsule Take 1 capsule (100 mg total) by mouth 2 (two) times daily.  . Venlafaxine HCl 225 MG TB24 Take 1 tablet (225 mg total) by mouth daily.   No current facility-administered medications on file prior to visit.      No Known Allergies  Review Of Systems:  Constitutional:   No  weight loss, night sweats,  Fevers, chills, fatigue, or  lassitude.  HEENT:   No headaches,  Difficulty swallowing,  Tooth/dental problems, or  Sore throat,                No sneezing, itching, ear ache, nasal congestion, post nasal drip,   CV:  No chest pain,  Orthopnea, PND, swelling in lower extremities, anasarca, dizziness, palpitations, syncope.   GI  No heartburn, indigestion, abdominal pain, nausea, vomiting, diarrhea, change in bowel habits, loss of appetite, bloody stools.   Resp: No shortness of breath with exertion or at rest.  No excess mucus, no productive cough,  No non-productive cough,  No coughing up of blood.  No change in color of mucus.  No wheezing.  No chest wall deformity  Skin: no rash or lesions.  GU: no dysuria, change in color of urine, no urgency or frequency.  No flank pain, no hematuria   MS:  No joint pain or swelling.  No decreased range of motion.  No back pain.  Psych:  No change in mood or affect. No depression or anxiety.  No memory loss.   Vital Signs BP 126/84 (BP Location: Left Arm, Patient Position: Sitting, Cuff Size: Normal)   Pulse 85   Ht 6\' 2"  (1.88 m)   Wt (!) 326 lb 9.6 oz (148.1 kg)   SpO2 96%   BMI 41.93 kg/m    Physical Exam:  General- No distress,  A&Ox3, pleasant, wearing a arm splint ENT: No sinus tenderness, TM clear, pale nasal mucosa, no oral exudate,no post nasal drip, no LAN Cardiac: S1, S2, regular rate and rhythm, no murmur Chest: No  wheeze/ rales/ dullness; no accessory muscle use, no nasal flaring, no sternal retractions Abd.: Soft Non-tender, nondistended, obese Ext: No clubbing cyanosis, edema Neuro:  normal strength Skin: No rashes, warm and dry Psych: normal mood and behavior   Assessment/Plan  OSA (obstructive sleep apnea) Doing well on CPAP Compliant appears to be benefiting from therapy Plan We will order a new machine since yours is 36 years old. If you  have to have another sleep test just call the office so we can place the order. Continue on CPAP at bedtime. You appear to be benefiting from the treatment Goal is to wear for at least 4-6 hours each night for maximal clinical benefit. Continue to work on weight loss, as the link between excess weight  and sleep apnea is well established.  Do not drive if sleepy. We will provide you with a letter and your most recent down load  that shows your compliance with CPAP therapy. Follow up with 1 year with Dr. Elsworth Soho Please contact office for sooner follow up if symptoms do not improve or worsen or seek emergency care     Magdalen Spatz, NP 08/03/2016  2:29 PM

## 2016-08-04 DIAGNOSIS — K219 Gastro-esophageal reflux disease without esophagitis: Secondary | ICD-10-CM

## 2016-08-04 DIAGNOSIS — K439 Ventral hernia without obstruction or gangrene: Secondary | ICD-10-CM | POA: Insufficient documentation

## 2016-08-04 HISTORY — DX: Ventral hernia without obstruction or gangrene: K43.9

## 2016-08-04 HISTORY — DX: Gastro-esophageal reflux disease without esophagitis: K21.9

## 2016-08-05 ENCOUNTER — Ambulatory Visit (INDEPENDENT_AMBULATORY_CARE_PROVIDER_SITE_OTHER): Payer: Commercial Managed Care - PPO | Admitting: Cardiology

## 2016-08-05 ENCOUNTER — Encounter: Payer: Self-pay | Admitting: Cardiology

## 2016-08-05 VITALS — BP 106/80 | HR 94 | Ht 74.5 in | Wt 328.1 lb

## 2016-08-05 DIAGNOSIS — E669 Obesity, unspecified: Secondary | ICD-10-CM

## 2016-08-05 DIAGNOSIS — E785 Hyperlipidemia, unspecified: Secondary | ICD-10-CM | POA: Diagnosis not present

## 2016-08-05 DIAGNOSIS — E1169 Type 2 diabetes mellitus with other specified complication: Secondary | ICD-10-CM

## 2016-08-05 DIAGNOSIS — I10 Essential (primary) hypertension: Secondary | ICD-10-CM

## 2016-08-05 DIAGNOSIS — R0789 Other chest pain: Secondary | ICD-10-CM

## 2016-08-05 DIAGNOSIS — R071 Chest pain on breathing: Secondary | ICD-10-CM | POA: Diagnosis not present

## 2016-08-05 MED ORDER — MELOXICAM 15 MG PO TABS: 15.0000 mg | ORAL_TABLET | Freq: Every day | ORAL | 1 refills | Status: AC

## 2016-08-05 NOTE — Patient Instructions (Addendum)
Medication Instructions:  Your physician has recommended you make the following change in your medication:  START meloxicam (Mobic) 15 mg daily   Labwork: None  Testing/Procedures: Your physician has requested that you have a lexiscan myoview. For further information please visit [email or web address]. Please follow instruction sheet, as given.    Follow-Up: Your physician recommends that you schedule a follow-up appointment in: 4 weeks   Any Other Special Instructions Will Be Listed Below (If Applicable).     If you need a refill on your cardiac medications before your next appointment, please call your pharmacy.    Costochondritis Costochondritis is swelling and irritation (inflammation) of the tissue (cartilage) that connects your ribs to your breastbone (sternum). This causes pain in the front of your chest. The pain usually starts gradually and involves more than one rib. What are the causes? The exact cause of this condition is not always known. It results from stress on the cartilage where your ribs attach to your sternum. The cause of this stress could be:  Chest injury (trauma).  Exercise or activity, such as lifting.  Severe coughing.  What increases the risk? You may be at higher risk for this condition if you:  Are male.  Are [age] years old.  Recently started a new exercise or work activity.  Have low levels of vitamin D.  Have a condition that makes you cough frequently.  What are the signs or symptoms? The main symptom of this condition is chest pain. The pain:  Usually starts gradually and can be sharp or dull.  Gets worse with deep breathing, coughing, or exercise.  Gets better with rest.  May be worse when you press on the sternum-rib connection (tenderness).  How is this diagnosed? This condition is diagnosed based on your symptoms, medical history, and a physical exam. Your health care provider will check for tenderness when pressing  on your sternum. This is the most important finding. You may also have tests to rule out other causes of chest pain. These may include:  A chest X-ray to check for lung problems.  An electrocardiogram (ECG) to see if you have a heart problem that could be causing the pain.  An imaging scan to rule out a chest or rib fracture.  How is this treated? This condition usually goes away on its own over time. Your health care provider may prescribe an NSAID to reduce pain and inflammation. Your health care provider may also suggest that you:  Rest and avoid activities that make pain worse.  Apply heat or cold to the area to reduce pain and inflammation.  Do exercises to stretch your chest muscles.  If these treatments do not help, your health care provider may inject a numbing medicine at the sternum-rib connection to help relieve the pain. Follow these instructions at home:  Avoid activities that make pain worse. This includes any activities that use chest, abdominal, and side muscles.  If directed, put ice on the painful area: ? Put ice in a plastic bag. ? Place a towel between your skin and the bag. ? Leave the ice on for 20 minutes, 2-3 times a day.  If directed, apply heat to the affected area as often as told by your health care provider. Use the heat source that your health care provider recommends, such as a moist heat pack or a heating pad. ? Place a towel between your skin and the heat source. ? Leave the heat on for 20-30 minutes. ? Remove the  heat if your skin turns bright red. This is especially important if you are unable to feel pain, heat, or cold. You may have a greater risk of getting burned.  Take over-the-counter and prescription medicines only as told by your health care provider.  Return to your normal activities as told by your health care provider. Ask your health care provider what activities are safe for you.  Keep all follow-up visits as told by your health care  provider. This is important. Contact a health care provider if:  You have chills or a fever.  Your pain does not go away or it gets worse.  You have a cough that does not go away (is persistent). Get help right away if:  You have shortness of breath. This information is not intended to replace advice given to you by your health care provider. Make sure you discuss any questions you have with your health care provider. Document Released: 10/15/2004 Document Revised: 07/26/2015 Document Reviewed: 05/01/2015 Elsevier Interactive Patient Education  [employer].
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
## 2016-08-11 ENCOUNTER — Telehealth (HOSPITAL_COMMUNITY): Payer: Self-pay | Admitting: *Deleted

## 2016-08-11 NOTE — Telephone Encounter (Signed)
Patient given detailed instructions per Myocardial Perfusion Study Information Sheet for the test on 08/12/16. Patient notified to arrive 15 minutes early and that it is imperative to arrive on time for appointment to keep from having the test rescheduled.  If you need to cancel or reschedule your appointment, please call the office within 24 hours of your appointment. . Patient verbalized understanding. Colin Bennett    

## 2016-08-13 ENCOUNTER — Ambulatory Visit (HOSPITAL_COMMUNITY): Payer: Commercial Managed Care - PPO | Attending: Cardiology

## 2016-08-13 DIAGNOSIS — R079 Chest pain, unspecified: Secondary | ICD-10-CM | POA: Insufficient documentation

## 2016-08-13 DIAGNOSIS — R0609 Other forms of dyspnea: Secondary | ICD-10-CM | POA: Insufficient documentation

## 2016-08-13 DIAGNOSIS — R071 Chest pain on breathing: Secondary | ICD-10-CM

## 2016-08-13 DIAGNOSIS — E119 Type 2 diabetes mellitus without complications: Secondary | ICD-10-CM | POA: Diagnosis not present

## 2016-08-13 DIAGNOSIS — I10 Essential (primary) hypertension: Secondary | ICD-10-CM | POA: Diagnosis not present

## 2016-08-13 DIAGNOSIS — R9439 Abnormal result of other cardiovascular function study: Secondary | ICD-10-CM | POA: Diagnosis not present

## 2016-08-13 DIAGNOSIS — I2510 Atherosclerotic heart disease of native coronary artery without angina pectoris: Secondary | ICD-10-CM | POA: Diagnosis not present

## 2016-08-13 DIAGNOSIS — R0789 Other chest pain: Secondary | ICD-10-CM

## 2016-08-13 MED ORDER — REGADENOSON 0.4 MG/5ML IV SOLN
0.4000 mg | Freq: Once | INTRAVENOUS | Status: AC
  Administered 2016-08-13: 0.4 mg via INTRAVENOUS

## 2016-08-13 MED ORDER — TECHNETIUM TC 99M TETROFOSMIN IV KIT
30.6000 | PACK | Freq: Once | INTRAVENOUS | Status: AC | PRN
  Administered 2016-08-13: 30.6 via INTRAVENOUS
  Filled 2016-08-13: qty 31

## 2016-08-14 ENCOUNTER — Ambulatory Visit (HOSPITAL_COMMUNITY): Payer: Commercial Managed Care - PPO | Attending: Cardiology

## 2016-08-14 LAB — MYOCARDIAL PERFUSION IMAGING
CHL CUP NUCLEAR SDS: 0
CHL CUP NUCLEAR SRS: 7
CHL CUP RESTING HR STRESS: 90 {beats}/min
LVDIAVOL: 133 mL (ref 62–150)
LVSYSVOL: 59 mL
Peak HR: 120 {beats}/min
RATE: 0.26
SSS: 7
TID: 0.9

## 2016-08-14 MED ORDER — TECHNETIUM TC 99M TETROFOSMIN IV KIT
31.9000 | PACK | Freq: Once | INTRAVENOUS | Status: AC | PRN
  Administered 2016-08-14: 31.9 via INTRAVENOUS
  Filled 2016-08-14: qty 32

## 2016-09-02 ENCOUNTER — Ambulatory Visit: Payer: Commercial Managed Care - PPO | Admitting: Cardiology

## 2016-09-03 ENCOUNTER — Ambulatory Visit (INDEPENDENT_AMBULATORY_CARE_PROVIDER_SITE_OTHER): Payer: Commercial Managed Care - PPO | Admitting: Cardiology

## 2016-09-03 ENCOUNTER — Encounter: Payer: Self-pay | Admitting: Cardiology

## 2016-09-03 ENCOUNTER — Ambulatory Visit: Payer: Commercial Managed Care - PPO | Admitting: Cardiology

## 2016-09-03 VITALS — BP 110/84 | HR 79 | Ht 74.5 in | Wt 334.8 lb

## 2016-09-03 DIAGNOSIS — R071 Chest pain on breathing: Secondary | ICD-10-CM

## 2016-09-03 DIAGNOSIS — R0789 Other chest pain: Secondary | ICD-10-CM

## 2016-09-03 NOTE — Patient Instructions (Addendum)
Medication Instructions:  Your physician has recommended you make the following change in your medication:  STOP mobic after you finish this month. START over the counter ibuprofen and Aleve.   Labwork: None  Testing/Procedures: None  Follow-Up: Your physician recommends that you schedule a follow-up appointment as needed if symptoms worsen or fail to improve.   Any Other Special Instructions Will Be Listed Below (If Applicable).     If you need a refill on your cardiac medications before your next appointment, please call your pharmacy.
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
## 2016-09-05 ENCOUNTER — Other Ambulatory Visit: Payer: Self-pay | Admitting: Family Medicine

## 2016-09-07 NOTE — Telephone Encounter (Signed)
Last office visit on 07/30/2016 Last refill #14 on 07/30/2016

## 2016-10-12 ENCOUNTER — Telehealth: Payer: Self-pay | Admitting: Family Medicine

## 2016-10-12 NOTE — Telephone Encounter (Signed)
Noted  

## 2016-10-12 NOTE — Telephone Encounter (Signed)
Per pt mother in law Colin Bennett pt committed suicide yesterday. She called today to inform of pt death.

## 2016-10-12 NOTE — Telephone Encounter (Signed)
See previous note

## 2016-10-12 NOTE — Telephone Encounter (Signed)
Please have his status changed in the chart and send a condolence letter to family. thanks

## 2016-10-14 NOTE — Telephone Encounter (Signed)
Chart has been changed to Deceased status. Has anyone sent the family a condolence letter?

## 2016-10-15 NOTE — Telephone Encounter (Signed)
I had the chart changed the day we received the news letter went out same day.

## 2016-10-19 ENCOUNTER — Telehealth: Payer: Self-pay | Admitting: Family Medicine

## 2016-10-19 NOTE — Telephone Encounter (Signed)
Pt wife Margaretha Sheffield would like a call concerning pt and the nature of his death. She did not give detail of info she wants to discuss with Charlett Blake.

## 2016-10-19 NOTE — Telephone Encounter (Signed)
Call and got a voice mail account so I left a message will try and call again tomorrow.

## 2016-10-19 DEATH — deceased

## 2016-10-20 NOTE — Telephone Encounter (Signed)
Spoke with patient's wife Colin Bennett about patient. She is struggling with overwhelming grief and was encouraged to seek counseling and has been provided with a phone number for [facility]. She is requesting a letter for insurance stating that Bipolar D/O is a medical condition that caused him to become unstable and he became unable to control his condition for insurance purposes. Otherwise they will not help her with his life insurance. She is advised to communicate regularly with [place] about what she needs from [place] to help but for now have agreed to write her a simple letter.

## 2016-10-22 ENCOUNTER — Other Ambulatory Visit: Payer: Commercial Managed Care - PPO

## 2016-10-23 ENCOUNTER — Encounter: Payer: Self-pay | Admitting: Family Medicine

## 2016-10-30 ENCOUNTER — Ambulatory Visit: Payer: Commercial Managed Care - PPO | Admitting: Family Medicine
